# Patient Record
Sex: Female | Born: 1975 | Race: Black or African American | Hispanic: No | Marital: Married | Smoking: Never smoker
Health system: Southern US, Community
[De-identification: ages and names within clinical notes are randomized; demographics above are authoritative.]

## PROBLEM LIST (undated history)

## (undated) DIAGNOSIS — J45909 Unspecified asthma, uncomplicated: Secondary | ICD-10-CM

## (undated) DIAGNOSIS — G43909 Migraine, unspecified, not intractable, without status migrainosus: Secondary | ICD-10-CM

## (undated) DIAGNOSIS — F419 Anxiety disorder, unspecified: Secondary | ICD-10-CM

## (undated) DIAGNOSIS — F32A Depression, unspecified: Secondary | ICD-10-CM

## (undated) DIAGNOSIS — M170 Bilateral primary osteoarthritis of knee: Secondary | ICD-10-CM

## (undated) DIAGNOSIS — I10 Essential (primary) hypertension: Secondary | ICD-10-CM

## (undated) DIAGNOSIS — F329 Major depressive disorder, single episode, unspecified: Secondary | ICD-10-CM

## (undated) DIAGNOSIS — D219 Benign neoplasm of connective and other soft tissue, unspecified: Principal | ICD-10-CM

## (undated) DIAGNOSIS — M722 Plantar fascial fibromatosis: Secondary | ICD-10-CM

## (undated) DIAGNOSIS — R002 Palpitations: Secondary | ICD-10-CM

## (undated) DIAGNOSIS — Z309 Encounter for contraceptive management, unspecified: Secondary | ICD-10-CM

## (undated) HISTORY — DX: Benign neoplasm of connective and other soft tissue, unspecified: D21.9

## (undated) HISTORY — DX: Encounter for contraceptive management, unspecified: Z30.9

## (undated) HISTORY — PX: ABDOMINAL HYSTERECTOMY: SHX81

## (undated) HISTORY — PX: WISDOM TOOTH EXTRACTION: SHX21

## (undated) HISTORY — DX: Essential (primary) hypertension: I10

## (undated) HISTORY — DX: Plantar fascial fibromatosis: M72.2

## (undated) HISTORY — DX: Migraine, unspecified, not intractable, without status migrainosus: G43.909

## (undated) HISTORY — DX: Bilateral primary osteoarthritis of knee: M17.0

## (undated) HISTORY — DX: Unspecified asthma, uncomplicated: J45.909

---

## 1993-07-15 HISTORY — PX: LEEP: SHX91

## 2007-01-05 ENCOUNTER — Ambulatory Visit (HOSPITAL_COMMUNITY): Admission: RE | Admit: 2007-01-05 | Discharge: 2007-01-05 | Payer: Self-pay | Admitting: Family Medicine

## 2007-11-21 ENCOUNTER — Emergency Department (HOSPITAL_COMMUNITY): Admission: EM | Admit: 2007-11-21 | Discharge: 2007-11-21 | Payer: Self-pay | Admitting: Emergency Medicine

## 2008-02-08 ENCOUNTER — Other Ambulatory Visit: Admission: RE | Admit: 2008-02-08 | Discharge: 2008-02-08 | Payer: Self-pay | Admitting: Obstetrics and Gynecology

## 2009-04-12 ENCOUNTER — Other Ambulatory Visit: Admission: RE | Admit: 2009-04-12 | Discharge: 2009-04-12 | Payer: Self-pay | Admitting: Obstetrics and Gynecology

## 2011-07-17 ENCOUNTER — Other Ambulatory Visit (HOSPITAL_COMMUNITY)
Admission: RE | Admit: 2011-07-17 | Discharge: 2011-07-17 | Disposition: A | Discharge status: Home / Self Care | Payer: BC Managed Care – PPO | Source: Ambulatory Visit | Attending: Obstetrics and Gynecology | Admitting: Obstetrics and Gynecology

## 2011-07-17 ENCOUNTER — Other Ambulatory Visit: Payer: Self-pay | Admitting: Adult Health

## 2011-07-17 DIAGNOSIS — Z01419 Encounter for gynecological examination (general) (routine) without abnormal findings: Secondary | ICD-10-CM | POA: Insufficient documentation

## 2012-10-05 ENCOUNTER — Telehealth: Payer: Self-pay | Admitting: Adult Health

## 2012-10-05 NOTE — Telephone Encounter (Signed)
Left message on machine for patient to return call

## 2012-10-05 NOTE — Telephone Encounter (Signed)
Pt stated that her pharmacy faxed a refill request but that it was not authorized. There is no evidence of a refill request being sent.

## 2012-10-05 NOTE — Telephone Encounter (Signed)
Called pt. Left message for her to call back in am.

## 2012-10-08 MED ORDER — LEVONORGEST-ETH ESTRAD 91-DAY 0.15-0.03 &0.01 MG PO TABS: 1.0000 | ORAL_TABLET | Freq: Every day | ORAL | Status: DC

## 2012-10-08 NOTE — Telephone Encounter (Signed)
Pt. Called and needed refill on Seasonique. [facility] [place].

## 2012-10-08 NOTE — Addendum Note (Signed)
Addended by: Cyril Mourning A on: 10/08/2012 05:19 PM   Modules accepted: Orders

## 2013-10-21 ENCOUNTER — Other Ambulatory Visit: Payer: Self-pay | Admitting: Adult Health

## 2013-10-21 MED ORDER — LEVONORGEST-ETH ESTRAD 91-DAY 0.15-0.03 &0.01 MG PO TABS: 1.0000 | ORAL_TABLET | Freq: Every day | ORAL | Status: DC

## 2013-10-26 ENCOUNTER — Other Ambulatory Visit: Payer: Self-pay | Admitting: Adult Health

## 2013-12-21 ENCOUNTER — Other Ambulatory Visit (HOSPITAL_COMMUNITY): Payer: Self-pay | Admitting: Family Medicine

## 2013-12-21 ENCOUNTER — Ambulatory Visit (HOSPITAL_COMMUNITY)
Admission: RE | Admit: 2013-12-21 | Discharge: 2013-12-21 | Disposition: A | Discharge status: Home / Self Care | Payer: BC Managed Care – PPO | Source: Ambulatory Visit | Attending: Family Medicine | Admitting: Family Medicine

## 2013-12-21 DIAGNOSIS — M25569 Pain in unspecified knee: Secondary | ICD-10-CM

## 2014-01-20 ENCOUNTER — Other Ambulatory Visit: Payer: Self-pay | Admitting: Adult Health

## 2014-03-07 ENCOUNTER — Ambulatory Visit (INDEPENDENT_AMBULATORY_CARE_PROVIDER_SITE_OTHER): Payer: BC Managed Care – PPO | Admitting: Adult Health

## 2014-03-07 ENCOUNTER — Other Ambulatory Visit (HOSPITAL_COMMUNITY)
Admission: RE | Admit: 2014-03-07 | Discharge: 2014-03-07 | Disposition: A | Discharge status: Home / Self Care | Payer: BC Managed Care – PPO | Source: Ambulatory Visit | Attending: Adult Health | Admitting: Adult Health

## 2014-03-07 ENCOUNTER — Encounter: Payer: Self-pay | Admitting: Adult Health

## 2014-03-07 VITALS — BP 142/80 | HR 78 | Ht 65.5 in | Wt 205.0 lb

## 2014-03-07 DIAGNOSIS — Z1151 Encounter for screening for human papillomavirus (HPV): Secondary | ICD-10-CM | POA: Insufficient documentation

## 2014-03-07 DIAGNOSIS — Z309 Encounter for contraceptive management, unspecified: Secondary | ICD-10-CM | POA: Insufficient documentation

## 2014-03-07 DIAGNOSIS — Z3041 Encounter for surveillance of contraceptive pills: Secondary | ICD-10-CM

## 2014-03-07 DIAGNOSIS — Z1212 Encounter for screening for malignant neoplasm of rectum: Secondary | ICD-10-CM

## 2014-03-07 DIAGNOSIS — Z01419 Encounter for gynecological examination (general) (routine) without abnormal findings: Secondary | ICD-10-CM | POA: Diagnosis not present

## 2014-03-07 HISTORY — DX: Encounter for contraceptive management, unspecified: Z30.9

## 2014-03-07 LAB — HEMOCCULT GUIAC POC 1CARD (OFFICE): FECAL OCCULT BLD: NEGATIVE

## 2014-03-07 MED ORDER — LEVONORGEST-ETH ESTRAD 91-DAY 0.15-0.03 &0.01 MG PO TABS: ORAL_TABLET | ORAL | Status: DC

## 2014-03-07 NOTE — Progress Notes (Signed)
Patient ID: Natalie Cordova, female   DOB: 03/04/1976, 38 y.o.   MRN: [id number] History of Present Illness: Natalie Cordova is a 38 year old black female, married in for a pap and physical.No complaints.   Current Medications, Allergies, Past Medical History, Past Surgical History, Family History and Social History were reviewed in [employer] record.     Review of Systems: Patient denies any headaches, blurred vision, shortness of breath, chest pain, abdominal pain, problems with bowel movements, urination, or intercourse. No joint pain or mood swings, she is happy with her OCs and periods are good.    Physical Exam:BP 142/80  Pulse 78  Ht 5' 5.5" (1.664 m)  Wt 205 lb (92.987 kg)  BMI 33.58 kg/m2 General:  Well developed, well nourished, no acute distress Skin:  Warm and dry Neck:  Midline trachea, normal thyroid Lungs; Clear to auscultation bilaterally Breast:  No dominant palpable mass, retraction, or nipple discharge Cardiovascular: Regular rate and rhythm Abdomen:  Soft, non tender, no hepatosplenomegaly Pelvic:  External genitalia is normal in appearance, no lesions. The vagina has good color, moisture and rugae. The cervix is bulbous, has several small nabothian cysts at 11-1 o'clock, and friable with EC brush, pap performed with HPV.  Uterus is felt to be normal size, shape, and contour.  No                adnexal masses or tenderness noted. Rectal: Good sphincter tone, no polyps, or hemorrhoids felt.  Hemoccult negative. Extremities:  No swelling or varicosities noted Psych:  No mood changes, alert and cooperative, seems happy   Impression: Yearly gyn exam Contraceptive management    Plan: Refilled camrese x 1 year Physical in 1 year Mammogram now and yearly  Labs with PCP

## 2014-03-07 NOTE — Patient Instructions (Signed)
Physical in 1 year Mammogram now and yearly  Labs PCP

## 2014-03-08 LAB — CYTOLOGY - PAP

## 2014-05-16 ENCOUNTER — Encounter: Payer: Self-pay | Admitting: Adult Health

## 2015-01-23 ENCOUNTER — Other Ambulatory Visit: Payer: Self-pay | Admitting: Adult Health

## 2016-01-15 ENCOUNTER — Other Ambulatory Visit: Payer: Self-pay | Admitting: Adult Health

## 2016-01-17 ENCOUNTER — Other Ambulatory Visit: Payer: Self-pay | Admitting: Adult Health

## 2016-04-23 ENCOUNTER — Other Ambulatory Visit: Payer: Self-pay | Admitting: Family Medicine

## 2016-04-23 DIAGNOSIS — Z1231 Encounter for screening mammogram for malignant neoplasm of breast: Secondary | ICD-10-CM

## 2016-05-07 ENCOUNTER — Ambulatory Visit: Payer: Self-pay

## 2016-05-23 ENCOUNTER — Ambulatory Visit
Admission: RE | Admit: 2016-05-23 | Discharge: 2016-05-23 | Disposition: A | Discharge status: Home / Self Care | Payer: BLUE CROSS/BLUE SHIELD | Source: Ambulatory Visit | Attending: Family Medicine | Admitting: Family Medicine

## 2016-05-23 DIAGNOSIS — Z1231 Encounter for screening mammogram for malignant neoplasm of breast: Secondary | ICD-10-CM

## 2016-05-27 ENCOUNTER — Telehealth: Payer: Self-pay | Admitting: Adult Health

## 2016-05-27 NOTE — Telephone Encounter (Signed)
Had period 10/5- 9, then 10/25-29 and again yesterday, no new meds or antibiotics, on camrese and it has never done this before, make appt to be seen

## 2016-05-27 NOTE — Telephone Encounter (Signed)
Pt called stating that she is having some issues with her period and would like to speak with jennifer first before scheduling an appointment. Please contact pt

## 2016-05-29 ENCOUNTER — Ambulatory Visit (INDEPENDENT_AMBULATORY_CARE_PROVIDER_SITE_OTHER): Payer: BLUE CROSS/BLUE SHIELD | Admitting: Adult Health

## 2016-05-29 ENCOUNTER — Encounter: Payer: Self-pay | Admitting: Adult Health

## 2016-05-29 VITALS — BP 112/72 | HR 94 | Ht 66.0 in | Wt 223.0 lb

## 2016-05-29 DIAGNOSIS — R5383 Other fatigue: Secondary | ICD-10-CM

## 2016-05-29 DIAGNOSIS — N921 Excessive and frequent menstruation with irregular cycle: Secondary | ICD-10-CM

## 2016-05-29 DIAGNOSIS — Z3041 Encounter for surveillance of contraceptive pills: Secondary | ICD-10-CM

## 2016-05-29 DIAGNOSIS — R635 Abnormal weight gain: Secondary | ICD-10-CM | POA: Diagnosis not present

## 2016-05-29 DIAGNOSIS — Z3202 Encounter for pregnancy test, result negative: Secondary | ICD-10-CM | POA: Insufficient documentation

## 2016-05-29 LAB — POCT URINE PREGNANCY: PREG TEST UR: NEGATIVE

## 2016-05-29 MED ORDER — LEVONORGEST-ETH ESTRAD 91-DAY 0.15-0.03 &0.01 MG PO TABS
1.0000 | ORAL_TABLET | Freq: Every day | ORAL | 1 refills | Status: DC
Start: 2016-05-29 — End: 2017-01-11

## 2016-05-29 NOTE — Patient Instructions (Addendum)
Keep taking OCs  Return in 8 weeks for pap and physical  F/U with Dr Karie Kirks next week

## 2016-05-29 NOTE — Progress Notes (Signed)
Subjective:     Patient ID: Natalie Cordova, female   DOB: Feb 03, 1976, 40 y.o.   MRN: [id number]  HPI Natalie Cordova is a 40 year old black female in complaining of irregular bleeding with Camrese.She is working second shift in [place] since [employer] closed and has gained weight and has decreased energy.  Review of Systems +irregular bleeding decreased energy  Weight gain  Reviewed past medical,surgical, social and family history. Reviewed medications and allergies.     Objective:   Physical Exam BP 112/72 (BP Location: Left Arm, Patient Position: Sitting, Cuff Size: Large)   Pulse 94   Ht 5\' 6"  (1.676 m)   Wt 223 lb (101.2 kg)   LMP 05/26/2016   BMI 35.99 kg/m UPT negative, Skin warm and dry. Neck: mid line trachea, normal thyroid, good ROM, no lymphadenopathy noted. Lungs: clear to ausculation bilaterally. Cardiovascular: regular rate and rhythm. Pelvic: external genitalia is normal in appearance no lesions, vagina: period like blood without odor,urethra has no lesions or masses noted, cervix:smooth and bulbous, uterus: normal size, shape and contour, non tender, no masses felt, adnexa: no masses or tenderness noted. Bladder is non tender and no masses felt.    PHQ 2 score 0.Had labs with PCP, and has appt next week with him.If bleeding continues may add estrogen, and may need [place].  Assessment:     1. Irregular intermenstrual bleeding   2. Pregnancy examination or test, negative result   3. Decreased energy   4. Weight gain   5. Encounter for surveillance of contraceptive pills       Plan:     Refilled camrese x 1 1 take 1 daily Return in 8 weeks for pap and physical F/U with Dr Karie Kirks next week, and get copy of labs

## 2016-07-24 ENCOUNTER — Other Ambulatory Visit (HOSPITAL_COMMUNITY)
Admission: RE | Admit: 2016-07-24 | Discharge: 2016-07-24 | Disposition: A | Discharge status: Home / Self Care | Payer: BLUE CROSS/BLUE SHIELD | Source: Ambulatory Visit | Attending: Adult Health | Admitting: Adult Health

## 2016-07-24 ENCOUNTER — Encounter: Payer: Self-pay | Admitting: Adult Health

## 2016-07-24 ENCOUNTER — Ambulatory Visit (INDEPENDENT_AMBULATORY_CARE_PROVIDER_SITE_OTHER): Payer: BLUE CROSS/BLUE SHIELD | Admitting: Adult Health

## 2016-07-24 VITALS — BP 116/76 | HR 105 | Ht 65.5 in | Wt 229.0 lb

## 2016-07-24 DIAGNOSIS — Z1212 Encounter for screening for malignant neoplasm of rectum: Secondary | ICD-10-CM | POA: Diagnosis not present

## 2016-07-24 DIAGNOSIS — Z1151 Encounter for screening for human papillomavirus (HPV): Secondary | ICD-10-CM | POA: Insufficient documentation

## 2016-07-24 DIAGNOSIS — Z01419 Encounter for gynecological examination (general) (routine) without abnormal findings: Secondary | ICD-10-CM | POA: Diagnosis not present

## 2016-07-24 DIAGNOSIS — Z713 Dietary counseling and surveillance: Secondary | ICD-10-CM

## 2016-07-24 DIAGNOSIS — Z3041 Encounter for surveillance of contraceptive pills: Secondary | ICD-10-CM

## 2016-07-24 DIAGNOSIS — R635 Abnormal weight gain: Secondary | ICD-10-CM | POA: Diagnosis not present

## 2016-07-24 DIAGNOSIS — Z302 Encounter for sterilization: Secondary | ICD-10-CM | POA: Insufficient documentation

## 2016-07-24 DIAGNOSIS — Z6837 Body mass index (BMI) 37.0-37.9, adult: Secondary | ICD-10-CM

## 2016-07-24 DIAGNOSIS — Z1211 Encounter for screening for malignant neoplasm of colon: Secondary | ICD-10-CM

## 2016-07-24 LAB — HEMOCCULT GUIAC POC 1CARD (OFFICE): Fecal Occult Blood, POC: NEGATIVE

## 2016-07-24 MED ORDER — PHENTERMINE HCL 15 MG PO CAPS: 15.0000 mg | ORAL_CAPSULE | ORAL | 0 refills | Status: DC

## 2016-07-24 NOTE — Progress Notes (Signed)
Patient ID: Natalie Cordova, female   DOB: June 19, 1976, 41 y.o.   MRN: [id number] History of Present Illness: Natalie Cordova is a 41 year old black female in for well woman gyn exam and pap.She was seen in November and had had BTB and that resolved, she has gained weight and wants to try weight loss meds, has used in past.She had labs at PCP and all was normal she says. PCP is Dr Karie Kirks.    Current Medications, Allergies, Past Medical History, Past Surgical History, Family History and Social History were reviewed in [employer] record.     Review of Systems: Patient denies any headaches, hearing loss, fatigue, blurred vision, shortness of breath, chest pain, abdominal pain, problems with bowel movements, urination, or intercourse. No joint pain or mood swings.Weight gain, has had cold    Physical Exam:BP 116/76 (BP Location: Left Arm, Patient Position: Sitting, Cuff Size: Large)   Pulse (!) 105   Ht 5' 5.5" (1.664 m)   Wt 229 lb (103.9 kg)   LMP 07/18/2016 (Exact Date)   BMI 37.53 kg/m  General:  Well developed, well nourished, no acute distress Skin:  Warm and dry Neck:  Midline trachea, normal thyroid, good ROM, no lymphadenopathy Lungs; Clear to auscultation bilaterally Breast:  No dominant palpable mass, retraction, or nipple discharge Cardiovascular: Regular rate and rhythm Abdomen:  Soft, non tender, no hepatosplenomegaly Pelvic:  External genitalia is normal in appearance, no lesions.  The vagina is normal in appearance. Urethra has no lesions or masses. The cervix is bulbous.Pap with HPV performed.   Uterus is felt to be normal size, shape, and contour.  No adnexal masses or tenderness noted.Bladder is non tender, no masses felt. Rectal: Good sphincter tone, no polyps, or hemorrhoids felt.  Hemoccult negative. Extremities/musculoskeletal:  No swelling or varicosities noted, no clubbing or cyanosis Psych:  No mood changes, alert and cooperative,seems  happy PHQ 2 score 0. She is aware if risks and benefits of phentermine and wants to try, will start at 15 mg.  Impression: 1. Encounter for gynecological examination with Papanicolaou smear of cervix   2. Encounter for surveillance of contraceptive pills   3. Weight gain   4. Body mass index 37.0-37.9, adult   5. Weight loss counseling, encounter for   6. Screening for colorectal cancer       Plan: Meds ordered this encounter  Medications  . phentermine 15 MG capsule    Sig: Take 1 capsule (15 mg total) by mouth every morning.    Dispense:  30 capsule    Refill:  0    Order Specific Question:   Supervising Provider    Answer:   Tania Ade H [[id number]]  Continue camrese, has refills Follow up in 4 weeks Physical in 1 year, pap in 3 if normal Mammogram yearly Labs with Dr Karie Kirks

## 2016-07-26 LAB — CYTOLOGY - PAP
DIAGNOSIS: NEGATIVE
HPV: NOT DETECTED

## 2016-08-21 ENCOUNTER — Ambulatory Visit: Payer: BLUE CROSS/BLUE SHIELD | Admitting: Adult Health

## 2016-08-27 ENCOUNTER — Ambulatory Visit (INDEPENDENT_AMBULATORY_CARE_PROVIDER_SITE_OTHER): Payer: BLUE CROSS/BLUE SHIELD | Admitting: Adult Health

## 2016-08-27 ENCOUNTER — Encounter: Payer: Self-pay | Admitting: Adult Health

## 2016-08-27 VITALS — BP 130/82 | HR 98 | Ht 65.5 in | Wt 225.0 lb

## 2016-08-27 DIAGNOSIS — Z6836 Body mass index (BMI) 36.0-36.9, adult: Secondary | ICD-10-CM

## 2016-08-27 DIAGNOSIS — R030 Elevated blood-pressure reading, without diagnosis of hypertension: Secondary | ICD-10-CM

## 2016-08-27 DIAGNOSIS — Z713 Dietary counseling and surveillance: Secondary | ICD-10-CM

## 2016-08-27 DIAGNOSIS — E6609 Other obesity due to excess calories: Secondary | ICD-10-CM | POA: Diagnosis not present

## 2016-08-27 MED ORDER — PHENTERMINE HCL 15 MG PO CAPS: 15.0000 mg | ORAL_CAPSULE | ORAL | 0 refills | Status: DC

## 2016-08-27 NOTE — Patient Instructions (Signed)
Continue weight loss efforts Increase activity

## 2016-08-27 NOTE — Progress Notes (Signed)
Subjective:     Patient ID: Natalie Cordova, female   DOB: [birth year], 41 y.o.   MRN: [id number]  HPI  Natalie Cordova is a 41 year old black female in for weight and BP check, has been on adipex for 1 month.  Review of Systems Patient denies any headaches, hearing loss, fatigue, blurred vision, shortness of breath, chest pain, abdominal pain, problems with bowel movements, urination, or intercourse. No joint pain or mood swings. Reviewed past medical,surgical, social and family history. Reviewed medications and allergies.     Objective:   Physical Exam BP 130/82 (BP Location: Left Arm, Patient Position: Sitting, Cuff Size: Normal)   Pulse 98   Ht 5' 5.5" (1.664 m)   Wt 225 lb (102.1 kg)   LMP 07/15/2016   BMI 36.87 kg/m PHQ 2 score 0. Skin warm and dry. Lungs: clear to ausculation bilaterally. Cardiovascular: regular rate and rhythm.Lost 4 lbs, will continue adipex.    Assessment:     1. Weight loss counseling, encounter for   2. Body mass index 36.0-36.9, adult       Plan:     Meds ordered this encounter  Medications  . phentermine 15 MG capsule    Sig: Take 1 capsule (15 mg total) by mouth every morning.    Dispense:  30 capsule    Refill:  0    Order Specific Question:   Supervising Provider    Answer:   Tania Ade H [[id number]]  Follow up in 4 weeks Increase activity

## 2016-09-09 ENCOUNTER — Other Ambulatory Visit (HOSPITAL_COMMUNITY)
Admission: RE | Admit: 2016-09-09 | Discharge: 2016-09-09 | Disposition: A | Discharge status: Home / Self Care | Payer: BLUE CROSS/BLUE SHIELD | Source: Ambulatory Visit | Attending: Family Medicine | Admitting: Family Medicine

## 2016-09-09 ENCOUNTER — Other Ambulatory Visit (HOSPITAL_COMMUNITY): Payer: Self-pay | Admitting: Family Medicine

## 2016-09-09 ENCOUNTER — Ambulatory Visit (HOSPITAL_COMMUNITY)
Admission: RE | Admit: 2016-09-09 | Discharge: 2016-09-09 | Disposition: A | Discharge status: Home / Self Care | Payer: BLUE CROSS/BLUE SHIELD | Source: Ambulatory Visit | Attending: Family Medicine | Admitting: Family Medicine

## 2016-09-09 DIAGNOSIS — R0600 Dyspnea, unspecified: Secondary | ICD-10-CM | POA: Diagnosis not present

## 2016-09-09 DIAGNOSIS — R0602 Shortness of breath: Secondary | ICD-10-CM | POA: Insufficient documentation

## 2016-09-09 LAB — BASIC METABOLIC PANEL
ANION GAP: 8 (ref 5–15)
BUN: 10 mg/dL (ref 6–20)
CALCIUM: 9.1 mg/dL (ref 8.9–10.3)
CO2: 24 mmol/L (ref 22–32)
CREATININE: 0.76 mg/dL (ref 0.44–1.00)
Chloride: 106 mmol/L (ref 101–111)
Glucose, Bld: 98 mg/dL (ref 65–99)
Potassium: 3.3 mmol/L — ABNORMAL LOW (ref 3.5–5.1)
Sodium: 138 mmol/L (ref 135–145)

## 2016-09-09 LAB — CBC WITH DIFFERENTIAL/PLATELET
BASOS ABS: 0 10*3/uL (ref 0.0–0.1)
BASOS PCT: 1 %
EOS ABS: 0.2 10*3/uL (ref 0.0–0.7)
Eosinophils Relative: 3 %
HEMATOCRIT: 40.4 % (ref 36.0–46.0)
HEMOGLOBIN: 13.7 g/dL (ref 12.0–15.0)
Lymphocytes Relative: 49 %
Lymphs Abs: 3.1 10*3/uL (ref 0.7–4.0)
MCH: 29.3 pg (ref 26.0–34.0)
MCHC: 33.9 g/dL (ref 30.0–36.0)
MCV: 86.5 fL (ref 78.0–100.0)
MONO ABS: 0.5 10*3/uL (ref 0.1–1.0)
MONOS PCT: 8 %
NEUTROS ABS: 2.5 10*3/uL (ref 1.7–7.7)
NEUTROS PCT: 39 %
Platelets: 399 10*3/uL (ref 150–400)
RBC: 4.67 MIL/uL (ref 3.87–5.11)
RDW: 14.2 % (ref 11.5–15.5)
WBC: 6.4 10*3/uL (ref 4.0–10.5)

## 2016-09-24 ENCOUNTER — Telehealth: Payer: Self-pay | Admitting: Adult Health

## 2016-09-24 ENCOUNTER — Ambulatory Visit: Payer: BLUE CROSS/BLUE SHIELD | Admitting: Adult Health

## 2016-09-24 NOTE — Telephone Encounter (Signed)
Stopped phentermine, it made BP go up, was just letting me know

## 2016-09-24 NOTE — Telephone Encounter (Signed)
Pt called stating that she would like a call back from [employer] only, Pt did not state the reason why, please contact pt

## 2016-10-10 ENCOUNTER — Telehealth: Payer: Self-pay | Admitting: Cardiology

## 2016-10-10 NOTE — Telephone Encounter (Signed)
Received records from [facility] for appointment on 10/25/16 with Dr Ellyn Hack.  Records put with Dr Allison Quarry schedule for 10/25/16. lp

## 2016-10-22 ENCOUNTER — Other Ambulatory Visit: Payer: Self-pay

## 2016-10-25 ENCOUNTER — Encounter: Payer: Self-pay | Admitting: Cardiology

## 2016-10-25 ENCOUNTER — Ambulatory Visit (INDEPENDENT_AMBULATORY_CARE_PROVIDER_SITE_OTHER): Payer: BLUE CROSS/BLUE SHIELD | Admitting: Cardiology

## 2016-10-25 VITALS — BP 140/91 | HR 100 | Ht 65.5 in | Wt 224.0 lb

## 2016-10-25 DIAGNOSIS — R002 Palpitations: Secondary | ICD-10-CM | POA: Diagnosis not present

## 2016-10-25 DIAGNOSIS — Z6837 Body mass index (BMI) 37.0-37.9, adult: Secondary | ICD-10-CM | POA: Diagnosis not present

## 2016-10-25 DIAGNOSIS — I10 Essential (primary) hypertension: Secondary | ICD-10-CM | POA: Diagnosis not present

## 2016-10-25 NOTE — Patient Instructions (Addendum)
If you can go to the  App store and [place] ( [employer]) [place] to recorder   Continue to monitor  - may use VAGAL MANEUVERS - COUGHING AND BEARING DOWN -ICE COLD WATER TO [place], OR DRINKING    Your physician recommends that you schedule a follow-up appointment in 3-4 MONTHS WITH DR HARDING.

## 2016-10-25 NOTE — Progress Notes (Addendum)
PCP: Robert Bellow, MD  Clinic Note: Chief Complaint  Patient presents with  . New Evaluation    tachypalpitations.    HPI: Natalie Cordova is a 41 y.o. female who is being seen today for the evaluation of palpitations & rapid heart beats at the request of Natalie Evens, MD. She has a history of Hypertention, asthma and migraine headaches Apparently she is also been noticing some chest pains in the past because of improved.  LAGUANA DESAUTEL was seen by Dr. Karie Cordova on 10/04/16 & noted 2 episodes of palpitations while sitting at church (1 in Feb & 1 in March). Both times her apple watch when off telling her that her heart rate was in the 130s to 140s bpm range.  Her PCP evaluated her CBC and chemistry panel and liver panel was also normal.  Recent Hospitalizations: n/a  Studies Reviewed: n/a  Interval History: Natalie Cordova presents today for discussion of her palpitation episodes. In the past. When she was having some chest discomfort but that has improved probably was related to a cold. She has not had any further episodes of rapid heartbeats that were prescribed when she saw her PCP. She tells me is that the episodes happened while at church she was relatively calm and all the sudden she felt her heart rate, fast, when she checked her abnormal walks fast. He did not feel he regular, she said the pulse rate looked stable. She felt a bit dizzy and short of breath with it, and felt like her heart was pounding hard. However, the episodes only lasted a couple minutes. She did not really notice any chest tightness or pressure with it. No near syncope or syncope Symptoms. he does drink maybe one caffeinated beverage a day and does not drink any power drinks. In addition to having some chest discomfort she is noticing some left arm discomfort recently, but that has actually improved. She thinks she may have pulled a muscle. It was not at all associated with a fast heart rates.  Cardiac  Review of Symptoms: No further chest pain or shortness of breath with rest or exertion.  No PND, orthopnea or edema.  No TIA/amaurosis fugax symptoms. No claudication.  She is had one episode of syncope about 15 years ago where she passed out in the shower. Since then she has not had any further episodes. She has not been taking phentermine for quite some time before was not on it when she had these episodes  ROS: A comprehensive was performed. Review of Systems  Constitutional: Negative for malaise/fatigue.  HENT: Negative for congestion.   Cardiovascular:       Per history of present illness  Gastrointestinal: Negative for blood in stool and melena.  Genitourinary: Negative for hematuria.  Musculoskeletal: Negative for falls and joint pain.       Left arm pain feeling better.  Neurological: Positive for dizziness (Only with rapid heartbeat spells).  Psychiatric/Behavioral: The patient is not nervous/anxious.        Only occasionally uses temazepam to help her sleep  All other systems reviewed and are negative.   Past Medical History:  Diagnosis Date  . Asthma   . Contraceptive management 03/07/2014  . Essential hypertension   . Migraine headache   . Osteoarthritis of both knees     History reviewed. No pertinent surgical history.  Current Meds  Medication Sig  . hydrochlorothiazide (HYDRODIURIL) 25 MG tablet Take 25 mg by mouth daily.  Marland Kitchen ibuprofen (ADVIL,MOTRIN) 800  MG tablet TAKE 1 TABLET UP TO THREE TIMES A DAY FOR PAIN AND INFLAMMATION PRN  . KLOR-CON M20 20 MEQ tablet Take 1 tablet by mouth daily.  . Levonorgestrel-Ethinyl Estradiol (AMETHIA,CAMRESE) 0.15-0.03 &0.01 MG tablet Take 1 tablet by mouth daily.  . phentermine 15 MG capsule Take 1 capsule (15 mg total) by mouth every morning.  Marland Kitchen PROAIR HFA 108 (90 Base) MCG/ACT inhaler Inhale 2 puffs into the lungs every 4 (four) hours as needed.  . SUMAtriptan (IMITREX) 100 MG tablet Take 1 tablet by mouth daily.  .  temazepam (RESTORIL) 30 MG capsule Take 1 capsule by mouth at bedtime.  - NOT taking Phenteramine  No Known Allergies  Social History   Social History  . Marital status: Single    Spouse name: N/A  . Number of children: 1  . Years of education: 23   Social History Main Topics  . Smoking status: Never Smoker  . Smokeless tobacco: Never Used  . Alcohol use Yes     Comment: occ beer or wine  . Drug use: No  . Sexual activity: Yes    Birth control/ protection: Pill   Other Topics Concern  . None   Social History Narrative   She is been married for 3-1/2 years. She lives with her husband and daughter (15 year old). She has a high school education, but is not currently working. She does not exercise because of lack of motivation. She does not drink alcohol or smoke.  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  family history includes Cancer in her maternal grandfather; Healthy in her brother, father, and mother.  Wt Readings from Last 3 Encounters:  10/25/16 224 lb (101.6 kg)  08/27/16 225 lb (102.1 kg)  07/24/16 229 lb (103.9 kg)    PHYSICAL EXAM BP (!) 140/91 (BP Location: Right Arm)   Pulse 100   Ht 5' 5.5" (1.664 m)   Wt 224 lb (101.6 kg)   BMI 36.71 kg/m  General appearance: alert, cooperative, appears stated age, no distress and Moderately obese. Well-nourished and well-groomed. HEENT: Natalie Cordova/AT, EOMI, MMM, anicteric sclera Neck: no adenopathy, no carotid bruit and no JVD Lungs: clear to auscultation bilaterally, normal percussion bilaterally and non-labored Heart: regular rate and rhythm, S1 & S2 normal, no murmur, click, rub or gallops; nondisplaced PMI Abdomen: soft, non-tender; bowel sounds normal; no masses,  no organomegaly; no HSM Extremities: extremities normal, atraumatic, no cyanosis, and edema - trivial Pulses: 2+ and symmetric;  Skin: mobility and turgor normal, no evidence of bleeding or bruising and no lesions noted Neurologic: Mental  status: Alert, oriented, thought content appropriate Cranial nerves: normal (II-XII grossly intact)    Adult ECG Report  Rate: 96 ;  Rhythm: normal sinus rhythm and normal axis, intervals & durations.;   Narrative Interpretation: normal   Other studies Reviewed: Additional studies/ records that were reviewed today include:  Recent Labs:  Labs just checked by PCP potassium was 3.3. Per clinic note, TSH was in checked. Lipid panel from August was stable.    ASSESSMENT / PLAN: Problem List Items Addressed This Visit    Body mass index 37.0-37.9, adult    Thankfully, it appears that she was not actually taken phentermine when she had these spells. She is adjusted weight loss, but am leery of restarting phentermine for the potential side effect of palpitations while we're trying to evaluate these spells.      Essential hypertension (Chronic)    BP essentially borderline  with resting HR of 100 bpm - would consider adding a Beta Blocker or Non-dihydropyridine calcium channel blocker (Diltiazem or Verapamil) as her next line of medication - would help with resting tachycardia as well as palpitations/ ? PSVT.      Relevant Orders   EKG 12-Lead   Heart palpitations - Primary    She has had 2 episodes in 2 months it only lasted about 2 minutes each. I am reluctant to have her wear a monitor for fear that she will actually have an episode captured. Since she does have an apple watch which is picked up both episodes, I suggested that she download a monitoring  App for her eye phone and I was asked that we will record her heart rhythms if she has these episodes. Line By description sudden onset tachycardia followed by sudden stopping would be more consistent with the episode of PSVT. The only downside to that the areas that usually SVT would be faster than 130s-140 BPM. However with no other potential diagnosis, I discussed vagal maneuvers such as coughing, Valsalva,etc.  - Plan to see her back in  roughly 3 or 4 months to see if she's had any episodes recorded in order to know the next course of action. We did discuss the possibility of beta blocker as Interim hypertensive agent which would help treat both her resting tachycardia and these palpitations spells.      Relevant Orders   EKG 12-Lead      Current medicines are reviewed at length with the patient today. (+/- concerns) n/a The following changes have been made: n/a  Patient Instructions  If you can go to the  App store and [place] ( [employer]OR) [place] to recorder   Continue to monitor  - may use VAGAL MANEUVERS - COUGHING AND BEARING DOWN -ICE COLD WATER TO [place]    Your physician recommends that you schedule a follow-up appointment in 3-4 MONTHS WITH DR Tavarius Grewe.    Studies Ordered:   Orders Placed This Encounter  Procedures  . EKG 12-Lead      Glenetta Hew, M.D., M.S. Interventional Cardiologist   Pager # [phone] Phone # [phone] [address] [place], [place] [address]

## 2016-10-27 ENCOUNTER — Encounter: Payer: Self-pay | Admitting: Cardiology

## 2016-10-27 NOTE — Assessment & Plan Note (Addendum)
She has had 2 episodes in 2 months it only lasted about 2 minutes each. I am reluctant to have her wear a monitor for fear that she will actually have an episode captured. Since she does have an apple watch which is picked up both episodes, I suggested that she download a monitoring  App for her eye phone and I was asked that we will record her heart rhythms if she has these episodes. Line By description sudden onset tachycardia followed by sudden stopping would be more consistent with the episode of PSVT. The only downside to that the areas that usually SVT would be faster than 130s-140 BPM. However with no other potential diagnosis, I discussed vagal maneuvers such as coughing, Valsalva,etc.  - Plan to see her back in roughly 3 or 4 months to see if she's had any episodes recorded in order to know the next course of action. We did discuss the possibility of beta blocker as Interim hypertensive agent which would help treat both her resting tachycardia and these palpitations spells.

## 2016-10-27 NOTE — Assessment & Plan Note (Signed)
BP essentially borderline with resting HR of 100 bpm - would consider adding a Beta Blocker or Non-dihydropyridine calcium channel blocker (Diltiazem or Verapamil) as her next line of medication - would help with resting tachycardia as well as palpitations/ ? PSVT.

## 2016-10-27 NOTE — Assessment & Plan Note (Signed)
Thankfully, it appears that she was not actually taken phentermine when she had these spells. She is adjusted weight loss, but am leery of restarting phentermine for the potential side effect of palpitations while we're trying to evaluate these spells.

## 2017-01-11 ENCOUNTER — Other Ambulatory Visit: Payer: Self-pay | Admitting: Adult Health

## 2017-01-27 ENCOUNTER — Ambulatory Visit: Payer: BLUE CROSS/BLUE SHIELD | Admitting: Cardiology

## 2017-02-13 ENCOUNTER — Ambulatory Visit: Payer: BLUE CROSS/BLUE SHIELD | Admitting: Cardiology

## 2017-04-21 ENCOUNTER — Telehealth: Payer: Self-pay | Admitting: *Deleted

## 2017-04-21 NOTE — Telephone Encounter (Signed)
Pt called stating that her birth [facility] [place] and [place]. Advised pt to try calling [facility] to see if they have it in stock, if not I would see if provider would prescribe something different. Pt verbalized understanding.

## 2017-04-22 NOTE — Telephone Encounter (Signed)
Pt called back and would like a new pill sent to [facility] [place].  Pt finished her previous pack on Sunday.  Pt aware to use back up birth control for first month per Derrek Monaco, NP.  Pt voiced understanding.  04-22-17  AS

## 2017-04-22 NOTE — Telephone Encounter (Signed)
Left message I called about her OCs, just let us know, if she found or wants different pill

## 2017-05-08 ENCOUNTER — Other Ambulatory Visit: Payer: Self-pay | Admitting: Family Medicine

## 2017-05-08 DIAGNOSIS — Z139 Encounter for screening, unspecified: Secondary | ICD-10-CM

## 2017-06-03 ENCOUNTER — Ambulatory Visit
Admission: RE | Admit: 2017-06-03 | Discharge: 2017-06-03 | Disposition: A | Discharge status: Home / Self Care | Payer: BLUE CROSS/BLUE SHIELD | Source: Ambulatory Visit | Attending: Family Medicine | Admitting: Family Medicine

## 2017-06-03 DIAGNOSIS — Z139 Encounter for screening, unspecified: Secondary | ICD-10-CM

## 2017-06-17 ENCOUNTER — Telehealth: Payer: Self-pay | Admitting: Adult Health

## 2017-06-17 NOTE — Telephone Encounter (Signed)
Patient called stating she is having very irregular bleeding, light/ heavy, cramping and "it's becoming a problem, something needs to be done". States she was dealing with this last year and just dealt with it but now wants something done. She says her period is not supposed to start until the end of this month but it has already started and she is only to have a period every 3 months but here lately that is not the case " it just comes on when it wants too". Please advise.

## 2017-06-17 NOTE — Telephone Encounter (Signed)
Patient called stating that she would like a call back from [place] did not state the reason for the call. Please contact pt

## 2017-06-18 NOTE — Telephone Encounter (Signed)
Informed patient that Anderson Malta would like to see her to discuss what needs to be done next. Pt states she will call back to schedule appointment.

## 2017-07-04 ENCOUNTER — Ambulatory Visit: Payer: BLUE CROSS/BLUE SHIELD | Admitting: Adult Health

## 2017-07-04 ENCOUNTER — Encounter: Payer: Self-pay | Admitting: Adult Health

## 2017-07-04 VITALS — BP 126/80 | HR 115 | Ht 65.5 in | Wt 234.5 lb

## 2017-07-04 DIAGNOSIS — N921 Excessive and frequent menstruation with irregular cycle: Secondary | ICD-10-CM

## 2017-07-04 DIAGNOSIS — Z3202 Encounter for pregnancy test, result negative: Secondary | ICD-10-CM

## 2017-07-04 DIAGNOSIS — N888 Other specified noninflammatory disorders of cervix uteri: Secondary | ICD-10-CM

## 2017-07-04 DIAGNOSIS — R000 Tachycardia, unspecified: Secondary | ICD-10-CM | POA: Diagnosis not present

## 2017-07-04 LAB — POCT URINE PREGNANCY: Preg Test, Ur: NEGATIVE

## 2017-07-04 NOTE — Progress Notes (Signed)
Subjective:     Patient ID: Natalie Cordova, female   DOB: 03/06/1976, 41 y.o.   MRN: [id number]  HPI Natalie Cordova is a 41 year old black female in complaining of irregular bleeding on OCs, and weight gain and decreased energy.   Review of Systems +irregular bleeding Weight gain +decreased energy  +stress at work  Reviewed past medical,surgical, social and family history. Reviewed medications and allergies.     Objective:   Physical Exam BP 126/80 (BP Location: Left Arm, Patient Position: Sitting, Cuff Size: Large)   Pulse (!) 115   Ht 5' 5.5" (1.664 m)   Wt 234 lb 8 oz (106.4 kg)   BMI 38.43 kg/m UPT is negative. Skin warm and dry. Lungs: clear to ausculation bilaterally. Cardiovascular: regular rate and rhythm.  Pelvic: external genitalia is normal in appearance no lesions, vagina: pink with good moisture and rugae,urethra has no lesions or masses noted, cervix is  Bulbous,with nabothian cyst at 12-1 o'clock, uterus: normal size, shape and contour, non tender, no masses felt, adnexa: no masses or tenderness noted. Bladder is non tender and no masses felt. She has seen cardiologist and had negative W/U in the past.  Will check labs and scheduled [place].  Assessment:     1. Irregular intermenstrual bleeding   2. Tachycardia   3. Pregnancy examination or test, negative result       Plan:    Continue OCs Check CBC,CMP,TSH  Return in 2 weeks for GYN [place]  Decrease caffeine  Try weight watchers

## 2017-07-04 NOTE — Patient Instructions (Signed)
Decrease caffeine

## 2017-07-05 LAB — COMPREHENSIVE METABOLIC PANEL
ALK PHOS: 56 IU/L (ref 39–117)
ALT: 16 IU/L (ref 0–32)
AST: 15 IU/L (ref 0–40)
Albumin/Globulin Ratio: 1.5 (ref 1.2–2.2)
Albumin: 4.2 g/dL (ref 3.5–5.5)
BUN/Creatinine Ratio: 12 (ref 9–23)
BUN: 9 mg/dL (ref 6–24)
CHLORIDE: 106 mmol/L (ref 96–106)
CO2: 18 mmol/L — AB (ref 20–29)
CREATININE: 0.74 mg/dL (ref 0.57–1.00)
Calcium: 9.4 mg/dL (ref 8.7–10.2)
GFR calc Af Amer: 115 mL/min/{1.73_m2} (ref >59)
GFR calc non Af Amer: 100 mL/min/{1.73_m2} (ref >59)
GLUCOSE: 106 mg/dL — AB (ref 65–99)
Globulin, Total: 2.8 g/dL (ref 1.5–4.5)
Potassium: 4.1 mmol/L (ref 3.5–5.2)
Sodium: 138 mmol/L (ref 134–144)
Total Protein: 7 g/dL (ref 6.0–8.5)

## 2017-07-05 LAB — CBC
Hematocrit: 39.6 % (ref 34.0–46.6)
Hemoglobin: 13.4 g/dL (ref 11.1–15.9)
MCH: 28.8 pg (ref 26.6–33.0)
MCHC: 33.8 g/dL (ref 31.5–35.7)
MCV: 85 fL (ref 79–97)
PLATELETS: 430 10*3/uL — AB (ref 150–379)
RBC: 4.65 x10E6/uL (ref 3.77–5.28)
RDW: 14.8 % (ref 12.3–15.4)
WBC: 6.5 10*3/uL (ref 3.4–10.8)

## 2017-07-05 LAB — TSH: TSH: 1.29 u[IU]/mL (ref 0.450–4.500)

## 2017-07-21 ENCOUNTER — Ambulatory Visit (INDEPENDENT_AMBULATORY_CARE_PROVIDER_SITE_OTHER): Payer: BLUE CROSS/BLUE SHIELD

## 2017-07-21 DIAGNOSIS — D259 Leiomyoma of uterus, unspecified: Secondary | ICD-10-CM

## 2017-07-21 DIAGNOSIS — N921 Excessive and frequent menstruation with irregular cycle: Secondary | ICD-10-CM

## 2017-07-21 NOTE — Progress Notes (Signed)
PELVIC US TA/TV:enlarged heterogeneous retroflexed uterus w/mult.fibroids,(#1) submucosal fibroid distorting endometrium 2.8 x 2.7 x 1.5 cm,(#2) anterior subserosal fibroid 2.9 x 2.2 x 3.4 cm,(#3) anterior intramural fibroid 1.5 x 1.3 x 1.8 cm,EEC 13 mm,normal right ovary,3 x 1.4 x 2.2 cm left hypoechoic ovarian mass vs pedunculated fibroid,it appears to be separate from uterus w/probe pressure,both ovaries are mobile,no free fluid,no pain during ultrasound

## 2017-07-22 ENCOUNTER — Encounter: Payer: Self-pay | Admitting: Adult Health

## 2017-07-22 ENCOUNTER — Telehealth: Payer: Self-pay | Admitting: Adult Health

## 2017-07-22 DIAGNOSIS — D219 Benign neoplasm of connective and other soft tissue, unspecified: Secondary | ICD-10-CM

## 2017-07-22 HISTORY — DX: Benign neoplasm of connective and other soft tissue, unspecified: D21.9

## 2017-07-22 NOTE — Telephone Encounter (Signed)
Pt aware US shows multiple fibroids and Dr Glo Herring suggests endometrial biopsy, will make appt with him for biopsy

## 2017-07-24 ENCOUNTER — Ambulatory Visit: Payer: BLUE CROSS/BLUE SHIELD | Admitting: Cardiology

## 2017-07-24 ENCOUNTER — Encounter: Payer: Self-pay | Admitting: Cardiology

## 2017-07-24 VITALS — BP 128/72 | HR 82 | Ht 65.5 in | Wt 235.6 lb

## 2017-07-24 DIAGNOSIS — R002 Palpitations: Secondary | ICD-10-CM

## 2017-07-24 DIAGNOSIS — I10 Essential (primary) hypertension: Secondary | ICD-10-CM

## 2017-07-24 NOTE — Patient Instructions (Addendum)
NO MEDICATION CHANGES   TEST /PROCEDURES SCHEDULE AT [facility] has recommended that you wear a 48  holter monitor. Holter monitors are medical devices that record the heart's electrical activity. Doctors most often use these monitors to diagnose arrhythmias. Arrhythmias are problems with the speed or rhythm of the heartbeat. The monitor is a small, portable device. You can wear one while you do your normal daily activities. This is usually used to diagnose what is causing palpitations/syncope (passing out).   KEEP HYDRATED  DRINK 8-10 GLASSES OF  AT LEAST  8 OZ WATER A DAY URINE SHOULD BE CLEAR WHEN YOU GO T O THE BATHROOM.   BIO FEEDBACK METHODS LIKE TAKING DEEP BREATHES   Your physician recommends that you schedule a follow-up appointment in  [place].

## 2017-07-24 NOTE — Progress Notes (Signed)
PCP: Lemmie Evens, MD  Clinic Note: Chief Complaint  Patient presents with  . Follow-up  . Palpitations    HPI: Natalie Cordova is a 42 y.o. female with a PMH below who presents today for delayed follow-up to reassess palpitations and hypertension.  There is concern about possible PSVT. Marland Kitchen   Natalie Cordova was last seen in April 2018.  She had only noted 2 episodes in about 2 months lasting 2 minutes of palpitations.  We decided against her wearing a monitor.  She was to use the apple watch to follow her heart rates.  Heart rates.  So I would like well your heart.    Recent Hospitalizations: none  Studies Personally Reviewed - (if available, images/films reviewed: From Epic Chart or Care Everywhere)  none  Interval History: She presents here today noticing that her palpitations seem to gotten worse. Apparently they went away last spring and she did not come to follow-up. She says over last month or so her irregular heartbeats seem to be worsening. They do seem to be better overall since she started metoprolol, but still feels that her heart rate can get up some. She notes that her heart rate can get fast in the shower with minimal activity. She also says he gets fast if she is anxious or stressed out. She denies any sensation of lightheadedness or dizziness associated with. No syncope or near syncope. No TIA or amaurosis fugax.  No chest pain or shortness of breath with rest or exertion. No PND, orthopnea or edema.  No claudication.  ROS: A comprehensive was performed. Pertinent Sx noted in HPI Review of Systems  Constitutional: Negative for malaise/fatigue.  Respiratory: Negative for shortness of breath.   Cardiovascular: Positive for palpitations.  Gastrointestinal: Negative.   Genitourinary: Negative.   Musculoskeletal: Negative.   Psychiatric/Behavioral: Negative.   All other systems reviewed and are negative.  I have reviewed and (if needed) personally updated  the patient's problem list, medications, allergies, past medical and surgical history, social and family history.   Past Medical History:  Diagnosis Date  . Asthma   . Contraceptive management 03/07/2014  . Essential hypertension   . Fibroids 07/22/2017  . Migraine headache   . Osteoarthritis of both knees     History reviewed. No pertinent surgical history.  Current Meds  Medication Sig  . GuaiFENesin (MUCINEX PO) Take by mouth as needed.  . hydrochlorothiazide (HYDRODIURIL) 25 MG tablet Take 25 mg by mouth daily.  Marland Kitchen ibuprofen (ADVIL,MOTRIN) 800 MG tablet TAKE 1 TABLET UP TO THREE TIMES A DAY FOR PAIN AND INFLAMMATION PRN  . Levonorgestrel-Ethinyl Estradiol (AMETHIA,CAMRESE) 0.15-0.03 &0.01 MG tablet TAKE 1 TABLET BY MOUTH DAILY.  . metoprolol succinate (TOPROL-XL) 25 MG 24 hr tablet TAKE ONE TABLET EACH DAY FOR HIGH BLOOD PRESSURE AND RAPID HEART BEAT.  Marland Kitchen PROAIR HFA 108 (90 Base) MCG/ACT inhaler Inhale 2 puffs into the lungs every 4 (four) hours as needed.  . SUMAtriptan (IMITREX) 100 MG tablet Take 1 tablet by mouth as needed.   . temazepam (RESTORIL) 30 MG capsule Take 1 capsule by mouth at bedtime as needed.     No Known Allergies  Social History   Tobacco Use  . Smoking status: Never Smoker  . Smokeless tobacco: Never Used  Substance Use Topics  . Alcohol use: Yes    Comment: occ beer or wine  . Drug use: No   Social History   Social History Narrative   She has been married  for 3-1/2 years. She lives with her husband and daughter (46 year old). She has a high school education, but is not currently working. She does not exercise because of lack of motivation. She does not drink alcohol or smoke.    family history includes Cancer in her maternal grandfather; Healthy in her brother, father, and mother.  Wt Readings from Last 3 Encounters:  07/24/17 235 lb 9.6 oz (106.9 kg)  07/04/17 234 lb 8 oz (106.4 kg)  10/25/16 224 lb (101.6 kg)    PHYSICAL EXAM BP 128/72    Pulse 82   Ht 5' 5.5" (1.664 m)   Wt 235 lb 9.6 oz (106.9 kg)   BMI 38.61 kg/m  Physical Exam  Constitutional: She is oriented to person, place, and time. She appears well-developed and well-nourished. No distress.  HENT:  Head: Normocephalic and atraumatic.  Eyes: EOM are normal. Pupils are equal, round, and reactive to light.  Neck: Normal range of motion. Neck supple. No hepatojugular reflux and no JVD present. Carotid bruit is not present.  Cardiovascular: Normal rate, regular rhythm, normal heart sounds and intact distal pulses.  No extrasystoles are present. PMI is not displaced. Exam reveals no gallop and no friction rub.  No murmur heard. Pulmonary/Chest: Effort normal and breath sounds normal. No respiratory distress. She has no wheezes. She has no rales.  Abdominal: Soft. Bowel sounds are normal. She exhibits no distension. There is no tenderness. There is no rebound.  Musculoskeletal: Normal range of motion. She exhibits no edema.  Neurological: She is alert and oriented to person, place, and time.  Skin: Skin is warm and dry. No rash noted. No erythema.  Psychiatric: She has a normal mood and affect. Her behavior is normal. Judgment and thought content normal.  Nursing note and vitals reviewed.   Adult ECG Report  Rate: 82 ;  Rhythm: normal sinus rhythm and normal axis, intervals & durations;   Narrative Interpretation: stable / normal EKG   Other studies Reviewed: Additional studies/ records that were reviewed today include:  Recent Labs:   Lab Results  Component Value Date   CREATININE 0.74 07/04/2017   BUN 9 07/04/2017   NA 138 07/04/2017   K 4.1 07/04/2017   CL 106 07/04/2017   CO2 18 (L) 07/04/2017   No results found for: CHOL, HDL, LDLCALC, LDLDIRECT, TRIG, CHOLHDL   ASSESSMENT / PLAN: Problem List Items Addressed This Visit    Essential hypertension (Chronic)    Well-controlled with current dose of Toprol.      Relevant Medications   metoprolol  succinate (TOPROL-XL) 25 MG 24 hr tablet   Other Relevant Orders   EKG 12-Lead (Completed)   Heart palpitations - Primary    Now with more frequent episodes happening at least once twice a day of tachycardia/palpitations. I think at this point we may be a be able to capture something using a 48-hour monitor  Continue beta blocker. Encourage adequate hydration.  Also encourage biofeedback techniques of deep breathing and relaxation/calming techniques..      Relevant Orders   EKG 12-Lead (Completed)   HOLTER MONITOR - 48 HOUR      Current medicines are reviewed at length with the patient today. (+/- concerns) n/a The following changes have been made: see below   Patient Instructions  NO MEDICATION CHANGES   TEST /PROCEDURES SCHEDULE AT [facility]  Your physician has recommended that you wear a 48  holter monitor. Holter monitors are medical devices that record  the heart's electrical activity. Doctors most often use these monitors to diagnose arrhythmias. Arrhythmias are problems with the speed or rhythm of the heartbeat. The monitor is a small, portable device. You can wear one while you do your normal daily activities. This is usually used to diagnose what is causing palpitations/syncope (passing out).   KEEP HYDRATED  DRINK 8-10 GLASSES OF  AT LEAST  8 OZ WATER A DAY URINE SHOULD BE CLEAR WHEN YOU GO T O THE BATHROOM.   BIO FEEDBACK METHODS LIKE TAKING DEEP BREATHES   Your physician recommends that you schedule a follow-up appointment in  [place].    Studies Ordered:   Orders Placed This Encounter  Procedures  . HOLTER MONITOR - 48 HOUR  . EKG 12-Lead      Glenetta Hew, M.D., M.S. Interventional Cardiologist   Pager # [phone] Phone # [phone] [address] [place], [place] [address]   Thank you for choosing [facility] at [facility]!!

## 2017-07-27 ENCOUNTER — Encounter: Payer: Self-pay | Admitting: Cardiology

## 2017-07-27 NOTE — Assessment & Plan Note (Signed)
Now with more frequent episodes happening at least once twice a day of tachycardia/palpitations. I think at this point we may be a be able to capture something using a 48-hour monitor  Continue beta blocker. Encourage adequate hydration.  Also encourage biofeedback techniques of deep breathing and relaxation/calming techniques.Marland Kitchen

## 2017-07-27 NOTE — Assessment & Plan Note (Signed)
Well-controlled with current dose of Toprol.

## 2017-08-04 ENCOUNTER — Ambulatory Visit (INDEPENDENT_AMBULATORY_CARE_PROVIDER_SITE_OTHER): Payer: BLUE CROSS/BLUE SHIELD

## 2017-08-04 DIAGNOSIS — R002 Palpitations: Secondary | ICD-10-CM

## 2017-08-07 ENCOUNTER — Ambulatory Visit (INDEPENDENT_AMBULATORY_CARE_PROVIDER_SITE_OTHER): Payer: BLUE CROSS/BLUE SHIELD | Admitting: Obstetrics and Gynecology

## 2017-08-07 ENCOUNTER — Other Ambulatory Visit: Payer: Self-pay

## 2017-08-07 ENCOUNTER — Encounter: Payer: Self-pay | Admitting: Obstetrics and Gynecology

## 2017-08-07 ENCOUNTER — Other Ambulatory Visit: Payer: Self-pay | Admitting: Obstetrics and Gynecology

## 2017-08-07 VITALS — BP 124/82 | HR 103 | Ht 65.5 in | Wt 235.0 lb

## 2017-08-07 DIAGNOSIS — D259 Leiomyoma of uterus, unspecified: Secondary | ICD-10-CM

## 2017-08-07 DIAGNOSIS — N939 Abnormal uterine and vaginal bleeding, unspecified: Secondary | ICD-10-CM | POA: Diagnosis not present

## 2017-08-07 DIAGNOSIS — Z3202 Encounter for pregnancy test, result negative: Secondary | ICD-10-CM | POA: Diagnosis not present

## 2017-08-07 LAB — POCT URINE PREGNANCY: PREG TEST UR: NEGATIVE

## 2017-08-07 NOTE — Progress Notes (Signed)
Natalie Cordova is a 42 y.o. female here for endometrial biopsy. For the last year and a half, her periods have been very irregular. She has a lot of clots, cramping, and spotting.  She has not had her tubes tied, but she is done having babies. Her birth control method is OCPs.    Endometrial Biopsy: Patient given informed consent, signed copy in the chart, time out was performed. Time out taken. The patient was placed in the lithotomy position and the cervix brought into view with sterile speculum.  Portio of cervix cleansed x 2 with betadine swabs.  A tenaculum was placed in the anterior lip of the cervix.  Pelvic Exam: Cervix is multiparous, some scarring present s/p LEEP Uterus is retroverted, 8-10 week size uterus, moderately enlarged   The uterus was sounded for depth of 10 cm,. Milex uterine Explora 3 mm was introduced to into the uterus, suction created,  and an endometrial sample was obtained. All equipment was removed and accounted for.   The patient tolerated the procedure well.    Patient given post procedure instructions.  Followup: in 1 week to discuss results and endometrial ablation, other management options   By signing my name below, I, Izna Ahmed, attest that this documentation has been prepared under the direction and in the presence of Jonnie Kind, MD. Electronically Signed: Jabier Gauss, Medical Scribe. 08/07/17. 9:54 AM.  I personally performed the services described in this documentation, which was SCRIBED in my presence. The recorded information has been reviewed and considered accurate. It has been edited as necessary during review. Jonnie Kind, MD

## 2017-08-22 ENCOUNTER — Ambulatory Visit (INDEPENDENT_AMBULATORY_CARE_PROVIDER_SITE_OTHER): Payer: BLUE CROSS/BLUE SHIELD | Admitting: Obstetrics and Gynecology

## 2017-08-22 ENCOUNTER — Encounter: Payer: Self-pay | Admitting: Obstetrics and Gynecology

## 2017-08-22 VITALS — BP 118/80 | HR 86 | Wt 236.0 lb

## 2017-08-22 DIAGNOSIS — Z3009 Encounter for other general counseling and advice on contraception: Secondary | ICD-10-CM | POA: Diagnosis not present

## 2017-08-22 DIAGNOSIS — D250 Submucous leiomyoma of uterus: Secondary | ICD-10-CM | POA: Diagnosis not present

## 2017-08-22 DIAGNOSIS — N939 Abnormal uterine and vaginal bleeding, unspecified: Secondary | ICD-10-CM

## 2017-08-22 NOTE — Progress Notes (Signed)
[facility] Visit  @DATE @            Patient name: Natalie Cordova MRN [id number]  Date of birth: 11/13/1975  CC & HPI:  Natalie Cordova is a 42 y.o. female presenting today for review of ultrasound and follow-up for irregular periods on oral contraceptives ultrasound has shown slight fibroid enlargement of the uterus with a submucosal fibroid in addition to subserosal and pedunculated fibroids  ROS:  ROS   Pertinent History Reviewed:   Reviewed: Significant for no anemia hemoglobin 13.4 at last check in December Medical         Past Medical History:  Diagnosis Date  . Asthma   . Contraceptive management 03/07/2014  . Essential hypertension   . Fibroids 07/22/2017  . Migraine headache   . Osteoarthritis of both knees                               Surgical Hx:   History reviewed. No pertinent surgical history. Medications: Reviewed & Updated - see associated section                       Current Outpatient Medications:  .  hydrochlorothiazide (HYDRODIURIL) 25 MG tablet, Take 25 mg by mouth daily., Disp: , Rfl:  .  ibuprofen (ADVIL,MOTRIN) 800 MG tablet, TAKE 1 TABLET UP TO THREE TIMES A DAY FOR PAIN AND INFLAMMATION PRN, Disp: , Rfl: 11 .  Levonorgestrel-Ethinyl Estradiol (AMETHIA,CAMRESE) 0.15-0.03 &0.01 MG tablet, TAKE 1 TABLET BY MOUTH DAILY., Disp: 91 tablet, Rfl: 2 .  metoprolol succinate (TOPROL-XL) 25 MG 24 hr tablet, TAKE ONE TABLET EACH DAY FOR HIGH BLOOD PRESSURE AND RAPID HEART BEAT., Disp: , Rfl: 3 .  PROAIR HFA 108 (90 Base) MCG/ACT inhaler, Inhale 2 puffs into the lungs every 4 (four) hours as needed., Disp: , Rfl:  .  SUMAtriptan (IMITREX) 100 MG tablet, Take 1 tablet by mouth as needed. , Disp: , Rfl:  .  temazepam (RESTORIL) 30 MG capsule, Take 1 capsule by mouth at bedtime as needed. , Disp: , Rfl: 5   Social History: Reviewed -  reports that  has never smoked. she has never used smokeless tobacco.  Objective Findings:  Vitals: Blood pressure  118/80, pulse 86, weight 236 lb (107 kg), last menstrual period 08/22/2017.  PHYSICAL EXAMINATION General appearance - alert, well appearing, and in no distress Mental status - alert, oriented to person, place, and time, normal mood, behavior, speech, dress, motor activity, and thought processes, affect appropriate to mood Chest - not examined Heart - normal rate and regular rhythm Abdomen - soft, nontender, nondistended, no masses or organomegaly Breasts -  Skin -   PELVIC  GYNECOLOGIC SONOGRAM   Natalie Cordova is a 42 y.o. G1P1001 LMP 07/16/2017,she is here for a pelvic sonogram for abnormal uterine bleeding.  Uterus                      9.5 x 7.1 x 7.8 cm, vol 276 ml,enlarged heterogeneous retroflexed uterus w/mult.fibroids including pedunculated apparent fibroid.  Endometrium          13 mm, symmetrical, submucosal fibroid distorting endometrium 2.8 x 2.7 x 1.5 cm in the lower uterine segment area  Right ovary             2.4 x 1.3 x 1.6 cm, wnl  Left ovary  2 x 2 x 2.9 cm, 3 x 1.4 x 2.2 cm left hypoechoic ovarian mass vs pedunculated fibroid,it appears to be separate from uterus w/probe pressure  No free fluid   Technician Comments:  PELVIC US TA/TV:enlarged heterogeneous retroflexed uterus w/mult.fibroids,(#1) submucosal fibroid distorting endometrium 2.8 x 2.7 x 1.5 cm,(#2) anterior subserosal fibroid 2.9 x 2.2 x 3.4 cm,(#3) anterior intramural fibroid 1.5 x 1.3 x 1.8 cm,EEC 13 mm,normal right ovary,3 x 1.4 x 2.2 cm left hypoechoic ovarian mass vs pedunculated fibroid,it appears to be separate from uterus w/probe pressure,both ovaries are mobile,no free fluid,no pain during ultrasound    [employer] 07/21/2017 5:01 PM  Clinical Impression and recommendations:  I have reviewed the sonogram results above.  Combined with the patient's current clinical course, below are my impressions and any appropriate recommendations for management based  on the sonographic findings:  1.  Enlarged uterus with diffuse heterogeneous pattern within the  myometrium consistent with multiple small fibroids.  The pedunculated fibroid in the left adnexa is the same echotexture as the intrauterine fibroids.  There is no cystic component.  There is no ascites.  Endometrial cavity is slightly distorted by fibroid.  Endometrial biopsy may be needed. Natalie Cordova  Assessment & Plan:   A:  1. Abnormal uterine bleeding on Camrese  submucosal fibroid 2. Moderate uterine enlargement due to intramural fibroids\ 3. Contraception management  P:  1. Will attempt IUD as contraception management.  Patient will discontinue Camrese the Sunday however.  Next week and will have her come back on the 18th for IUD insertion The patient is aware that this is a lower success rate in patients with fibroids, estimated 75% or less, but worth considering prior to more involved procedure such as endometrial ablation or hysteroscopic resection of myoma

## 2017-09-01 ENCOUNTER — Ambulatory Visit: Payer: BLUE CROSS/BLUE SHIELD | Admitting: Obstetrics and Gynecology

## 2017-09-01 ENCOUNTER — Encounter: Payer: Self-pay | Admitting: Obstetrics and Gynecology

## 2017-09-01 VITALS — BP 126/72 | HR 74 | Ht 65.5 in | Wt 237.2 lb

## 2017-09-01 DIAGNOSIS — Z302 Encounter for sterilization: Secondary | ICD-10-CM

## 2017-09-01 DIAGNOSIS — N921 Excessive and frequent menstruation with irregular cycle: Secondary | ICD-10-CM

## 2017-09-01 DIAGNOSIS — D251 Intramural leiomyoma of uterus: Secondary | ICD-10-CM | POA: Diagnosis not present

## 2017-09-01 DIAGNOSIS — D250 Submucous leiomyoma of uterus: Secondary | ICD-10-CM

## 2017-09-01 MED ORDER — MEDROXYPROGESTERONE ACETATE 10 MG PO TABS: 10.0000 mg | ORAL_TABLET | Freq: Every day | ORAL | 0 refills | Status: DC

## 2017-09-01 NOTE — Progress Notes (Signed)
[facility] Visit  09/01/2017            Patient name: Natalie Cordova MRN [id number]  Date of birth: 1975-11-13  CC & HPI:  Natalie Cordova is a 42 y.o. female presenting today for contraception management.and control of future menses. She was initially interested in IUD, but became 'spooked' and wanted to discuss other options as well. She has a history of irregular period on oral contraceptives, per chart review.  She has had lots of breakthrough bleeding oral contraceptive a an 84-day pack of Camrese She is currently on Camrese. No associated symptoms noted. No alleviating factors noted. She has a hx of uterine fibroids. Patient's last menstrual period was 08/22/2017. She is expecting her next period in the next month and a half.   ROS:  ROS (+) contraception management (-) pain All systems are negative except as noted in the HPI and PMH.   Pertinent History Reviewed:   Reviewed: Medical         Past Medical History:  Diagnosis Date  . Asthma   . Contraceptive management 03/07/2014  . Essential hypertension   . Fibroids 07/22/2017  . Migraine headache   . Osteoarthritis of both knees                               Surgical Hx:   No past surgical history on file. Medications: Reviewed & Updated - see associated section                       Current Outpatient Medications:  .  hydrochlorothiazide (HYDRODIURIL) 25 MG tablet, Take 25 mg by mouth daily., Disp: , Rfl:  .  ibuprofen (ADVIL,MOTRIN) 800 MG tablet, TAKE 1 TABLET UP TO THREE TIMES A DAY FOR PAIN AND INFLAMMATION PRN, Disp: , Rfl: 11 .  Levonorgestrel-Ethinyl Estradiol (AMETHIA,CAMRESE) 0.15-0.03 &0.01 MG tablet, TAKE 1 TABLET BY MOUTH DAILY., Disp: 91 tablet, Rfl: 2 .  metoprolol succinate (TOPROL-XL) 25 MG 24 hr tablet, TAKE ONE TABLET EACH DAY FOR HIGH BLOOD PRESSURE AND RAPID HEART BEAT., Disp: , Rfl: 3 .  PROAIR HFA 108 (90 Base) MCG/ACT inhaler, Inhale 2 puffs into the lungs every 4 (four) hours as  needed., Disp: , Rfl:  .  SUMAtriptan (IMITREX) 100 MG tablet, Take 1 tablet by mouth as needed. , Disp: , Rfl:  .  temazepam (RESTORIL) 30 MG capsule, Take 1 capsule by mouth at bedtime as needed. , Disp: , Rfl: 5   Social History: Reviewed -  reports that  has never smoked. she has never used smokeless tobacco.  Objective Findings:  Vitals: Blood pressure 126/72, pulse 74, height 5' 5.5" (1.664 m), weight 237 lb 3.2 oz (107.6 kg), last menstrual period 08/22/2017.  PHYSICAL EXAMINATION General appearance - alert, well appearing, and in no distress and oriented to person, place, and time Mental status - alert, oriented to person, place, and time, normal mood, behavior, speech, dress, motor activity, and thought processes Chest -  Heart -  Abdomen -  Breasts -  Skin -   PELVIC Ultrasound reviewed which shows a fibroid that is submucous that deforms the endometrial cavity slightly in a rather generous amount of endometrial cavity tissue on the recent ultrasound.  She has had a prior endometrial biopsy which which showed benign tissue Discussion: 1. Discussed with pt options of contraceptive management: IUD, endometrial ablation, OCPs, tubal ligation. Patient  does not plan on having any more babies. Pt's ultrasound shown and discussed as well. Tubal ligation with endometrial ablation discussed as an option to be done at the same time. Patient understands that this would be a permanent sterilization.  At end of discussion, pt had opportunity to ask questions and has no further questions at this time.   Specific discussion as noted above. Greater than 50% was spent in counseling and coordination of care with the patient.   Total time greater than: 25 minutes.   Assessment & Plan:   A:  1. breakthrough bleeding on OCP 2.  uterine fibroids 3.  Desire for permanent sterilization 4.  Biopsy-proven benign endometrial cavity  P:  1. Schedule endometrial ablation HTA, laparoscopic   Bilateral salpingectomy in 3 weeks at [facility], patient will stop birth control 1 week before procedure   By signing my name below, I, Izna Ahmed, attest that this documentation has been prepared under the direction and in the presence of Jonnie Kind, MD. Electronically Signed: Jabier Gauss, Medical Scribe. 09/01/17. 2:49 PM.  I personally performed the services described in this documentation, which was SCRIBED in my presence. The recorded information has been reviewed and considered accurate. It has been edited as necessary during review. Jonnie Kind, MD

## 2017-09-05 ENCOUNTER — Telehealth: Payer: Self-pay | Admitting: Obstetrics and Gynecology

## 2017-09-15 ENCOUNTER — Other Ambulatory Visit: Payer: Self-pay | Admitting: *Deleted

## 2017-09-18 ENCOUNTER — Encounter: Payer: Self-pay | Admitting: Cardiology

## 2017-09-18 ENCOUNTER — Ambulatory Visit: Payer: BLUE CROSS/BLUE SHIELD | Admitting: Cardiology

## 2017-09-18 VITALS — BP 121/81 | HR 85 | Ht 65.5 in | Wt 232.8 lb

## 2017-09-18 DIAGNOSIS — I10 Essential (primary) hypertension: Secondary | ICD-10-CM

## 2017-09-18 DIAGNOSIS — R002 Palpitations: Secondary | ICD-10-CM | POA: Diagnosis not present

## 2017-09-18 MED ORDER — METOPROLOL SUCCINATE ER 50 MG PO TB24: 50.0000 mg | ORAL_TABLET | Freq: Every day | ORAL | 3 refills | Status: DC

## 2017-09-18 NOTE — Progress Notes (Signed)
PCP: Lemmie Evens, MD  Clinic Note: Chief Complaint  Patient presents with  . Follow-up    Notable improvement  . Palpitations    HPI: Natalie Cordova is a 42 y.o. female with a PMH below who presents today for 46-month follow-up to reassess palpitations and hypertension she recently wore a 48-hour monitor -to reassess palpitations.  There was a concern about possible PSVT. Marland Kitchen   Natalie Cordova was initially seen back in April 2018, but her palpitations stabilized.  However she return for follow-up July 24, 2017 noting that her had gotten worse.  It did improve when started metoprolol still under heart rate can go up some.  Usually associated with anxiety or stress.  48-hour monitor ordered.  Recent Hospitalizations: none  Studies Personally Reviewed - (if available, images/films reviewed: From Epic Chart or Care Everywhere)  48-hour monitor:Essentially normal monitor. Predominant rhythm is normal sinus rhythm along with some sinus bradycardia and some sinus tachycardia with sinus arrhythmia.  Heart rate ranged from 57-125 bpm. Fastest rate was 155 bpm  Rare isolated PACs and PVCs.  Occasional dropped PACs..  No arrhythmia noted.  Interval History: Dementia returns here today saying that overall her palpitations have notably improved.  Pretty much from the time she was wearing the monitor forward, she really has not had much more of the palpitations.  She says that 1 or 2 times since her last visit she took an extra dose of metoprolol, but that was in the beginning and has not had to do it since.  She states that she may feel her heart rate going up a little bit but has not really gone into the range where she is concerned anymore.  The fastest she felt it was in the low 100s and she was able to calm it down with just some deep breaths.  No further symptoms of dizziness or wooziness, no cysts syncope/near syncope.  No TIA or amaurosis fugax.  No chest pain or  pressure/dyspnea with rest or exertion. No PND, orthopnea or edema.  No claudication.   ROS:  Pertinent Sx noted in HPI Review of Systems  Constitutional: Negative for malaise/fatigue.  Respiratory: Negative for shortness of breath.   Cardiovascular:       Per HPI  Neurological: Negative for dizziness and focal weakness.  Psychiatric/Behavioral: Negative for memory loss. The patient is not nervous/anxious and does not have insomnia.    I have reviewed and (if needed) personally updated the patient's problem list, medications, allergies, past medical and surgical history, social and family history.   Past Medical History:  Diagnosis Date  . Asthma   . Contraceptive management 03/07/2014  . Essential hypertension   . Fibroids 07/22/2017  . Migraine headache   . Osteoarthritis of both knees     History reviewed. No pertinent surgical history.  Current Meds  Medication Sig  . hydrochlorothiazide (HYDRODIURIL) 25 MG tablet Take 25 mg by mouth daily.  Marland Kitchen ibuprofen (ADVIL,MOTRIN) 800 MG tablet TAKE 1 TABLET UP TO THREE TIMES A DAY FOR PAIN AND INFLAMMATION PRN  . Levonorgestrel-Ethinyl Estradiol (AMETHIA,CAMRESE) 0.15-0.03 &0.01 MG tablet TAKE 1 TABLET BY MOUTH DAILY.  Marland Kitchen PROAIR HFA 108 (90 Base) MCG/ACT inhaler Inhale 2 puffs into the lungs every 4 (four) hours as needed.  . SUMAtriptan (IMITREX) 100 MG tablet Take 1 tablet by mouth as needed.   . temazepam (RESTORIL) 30 MG capsule Take 1 capsule by mouth at bedtime as needed.   . [DISCONTINUED] metoprolol succinate (TOPROL-XL)  25 MG 24 hr tablet TAKE ONE TABLET EACH DAY FOR HIGH BLOOD PRESSURE AND RAPID HEART BEAT.    No Known Allergies  Social History   Tobacco Use  . Smoking status: Never Smoker  . Smokeless tobacco: Never Used  Substance Use Topics  . Alcohol use: Yes    Comment: occ beer or wine  . Drug use: No   Social History   Social History Narrative   She has been married for 3-1/2 years. She lives with her husband  and daughter (32 year old). She has a high school education, but is not currently working. She does not exercise because of lack of motivation. She does not drink alcohol or smoke.    family history includes Cancer in her maternal grandfather; Healthy in her brother, father, and mother.  Wt Readings from Last 3 Encounters:  09/18/17 232 lb 12.8 oz (105.6 kg)  09/01/17 237 lb 3.2 oz (107.6 kg)  08/22/17 236 lb (107 kg)    PHYSICAL EXAM BP 121/81   Pulse 85   Ht 5' 5.5" (1.664 m)   Wt 232 lb 12.8 oz (105.6 kg)   LMP 08/22/2017   SpO2 99%   BMI 38.15 kg/m  Physical Exam  Constitutional: She is oriented to person, place, and time. She appears well-developed and well-nourished. No distress.  HENT:  Head: Normocephalic and atraumatic.  Neck: Neck supple. No hepatojugular reflux and no JVD present. Carotid bruit is not present.  Cardiovascular: Normal rate, regular rhythm, normal heart sounds and intact distal pulses.  No extrasystoles are present. PMI is not displaced. Exam reveals no gallop and no friction rub.  No murmur heard. Pulmonary/Chest: Effort normal and breath sounds normal. No respiratory distress. She has no wheezes. She has no rales.  Musculoskeletal: Normal range of motion. She exhibits no edema.  Neurological: She is alert and oriented to person, place, and time.  Psychiatric: She has a normal mood and affect. Her behavior is normal. Judgment and thought content normal.  Nursing note and vitals reviewed.   Adult ECG Report Not checked  Other studies Reviewed: Additional studies/ records that were reviewed today include:  Recent Labs:   Lab Results  Component Value Date   CREATININE 0.74 07/04/2017   BUN 9 07/04/2017   NA 138 07/04/2017   K 4.1 07/04/2017   CL 106 07/04/2017   CO2 18 (L) 07/04/2017   No results found for: CHOL, HDL, LDLCALC, LDLDIRECT, TRIG, CHOLHDL   ASSESSMENT / PLAN: Problem List Items Addressed This Visit    Essential hypertension  (Chronic)    Well-controlled with current dose of beta-blocker but should be able to tolerate higher dose.      Relevant Medications   metoprolol succinate (TOPROL-XL) 50 MG 24 hr tablet   Heart palpitations - Primary    Again back to her baseline with notable improvement.  We talked about these episodes coming and going in spells. Continue to recommend adequate hydration and minimize caffeine (although okay to have at least 1 cup of coffee a day.) Will increase Toprol to 50 mg and have her take it at nighttime.   Also discussed biofeedback techniques And Vagal Maneuvers.         Current medicines are reviewed at length with the patient today. (+/- concerns) n/a The following changes have been made: see below   Patient Instructions  MEDICATION INSTRUCTIONS  TOPROL XL 50 MG ( METOPROLOL ER SUCCINATE) AT BEDTIME      HYDRATE ( 10 ) 8-10 OZ  OF WATER DAILY.  OKAY TO HAVE A CUP OF COFFEE.    Your physician wants you to follow-up in [place].You will receive a reminder letter in the mail two months in advance. If you don't receive a letter, please call our office to schedule the follow-up appointment.   If you need a refill on your cardiac medications before your next appointment, please call your pharmacy.   Studies Ordered:   No orders of the defined types were placed in this encounter.     Glenetta Hew, M.D., M.S. Interventional Cardiologist   Pager # [phone] Phone # [phone] [address] [place], [place] [address]   Thank you for choosing [facility] at [facility]!!

## 2017-09-18 NOTE — Patient Instructions (Addendum)
MEDICATION INSTRUCTIONS  TOPROL XL 50 MG ( METOPROLOL ER SUCCINATE) AT BEDTIME      HYDRATE ( 10 ) 8-10 OZ OF WATER DAILY.  OKAY TO HAVE A CUP OF COFFEE.    Your physician wants you to follow-up in [place].You will receive a reminder letter in the mail two months in advance. If you don't receive a letter, please call our office to schedule the follow-up appointment.   If you need a refill on your cardiac medications before your next appointment, please call your pharmacy.

## 2017-09-20 ENCOUNTER — Encounter: Payer: Self-pay | Admitting: Cardiology

## 2017-09-20 NOTE — Assessment & Plan Note (Signed)
Again back to her baseline with notable improvement.  We talked about these episodes coming and going in spells. Continue to recommend adequate hydration and minimize caffeine (although okay to have at least 1 cup of coffee a day.) Will increase Toprol to 50 mg and have her take it at nighttime.   Also discussed biofeedback techniques And Vagal Maneuvers.

## 2017-09-20 NOTE — Assessment & Plan Note (Signed)
Well-controlled with current dose of beta-blocker but should be able to tolerate higher dose.

## 2017-10-07 ENCOUNTER — Other Ambulatory Visit: Payer: Self-pay | Admitting: Obstetrics & Gynecology

## 2017-10-07 ENCOUNTER — Telehealth: Payer: Self-pay | Admitting: *Deleted

## 2017-10-07 MED ORDER — MEDROXYPROGESTERONE ACETATE 10 MG PO TABS: 10.0000 mg | ORAL_TABLET | Freq: Every day | ORAL | 3 refills | Status: DC

## 2017-10-07 NOTE — Telephone Encounter (Signed)
Pharmacy request for 90 day supply of medroxyprogesterone sent in.

## 2017-10-09 ENCOUNTER — Other Ambulatory Visit: Payer: Self-pay | Admitting: Adult Health

## 2017-10-14 ENCOUNTER — Ambulatory Visit: Payer: BLUE CROSS/BLUE SHIELD | Admitting: Obstetrics and Gynecology

## 2017-10-14 ENCOUNTER — Encounter: Payer: Self-pay | Admitting: Obstetrics and Gynecology

## 2017-10-14 VITALS — BP 140/80 | HR 95 | Ht 65.2 in | Wt 239.0 lb

## 2017-10-14 DIAGNOSIS — N921 Excessive and frequent menstruation with irregular cycle: Secondary | ICD-10-CM

## 2017-10-14 DIAGNOSIS — D259 Leiomyoma of uterus, unspecified: Secondary | ICD-10-CM | POA: Insufficient documentation

## 2017-10-14 DIAGNOSIS — D251 Intramural leiomyoma of uterus: Secondary | ICD-10-CM

## 2017-10-14 DIAGNOSIS — D250 Submucous leiomyoma of uterus: Secondary | ICD-10-CM | POA: Diagnosis not present

## 2017-10-14 DIAGNOSIS — Z302 Encounter for sterilization: Secondary | ICD-10-CM

## 2017-10-14 NOTE — Progress Notes (Signed)
   [facility] Visit  @DATE @            Patient name: Natalie Cordova MRN [id number]  Date of birth: 1975-10-07  CC & HPI:  Natalie Cordova is a 42 y.o. female presenting today for follow-up on the desire for permanent sterilization and endometrial ablation.  Efforts to contact [facility] completed and we been able to schedule her for Friday, April 26 at 1:30 PM for hysteroscopy [facility] endometrial ablation as well as laparoscopic tubal sterilization.  We will be performing a bilateral salpingectomy  ROS:  ROS   Pertinent History Reviewed:   Reviewed: Significant for known uterine fibroids that she would be more amenable to HTA endometrial ablation Medical         Past Medical History:  Diagnosis Date  . Asthma   . Contraceptive management 03/07/2014  . Essential hypertension   . Fibroids 07/22/2017  . Migraine headache   . Osteoarthritis of both knees                               Surgical Hx:   History reviewed. No pertinent surgical history. Medications: Reviewed & Updated - see associated section                       Current Outpatient Medications:  .  hydrochlorothiazide (HYDRODIURIL) 25 MG tablet, Take 25 mg by mouth daily., Disp: , Rfl:  .  ibuprofen (ADVIL,MOTRIN) 800 MG tablet, TAKE 1 TABLET UP TO THREE TIMES A DAY FOR PAIN AND INFLAMMATION PRN, Disp: , Rfl: 11 .  Levonorgestrel-Ethinyl Estradiol (AMETHIA,CAMRESE) 0.15-0.03 &0.01 MG tablet, TAKE 1 TABLET BY MOUTH DAILY., Disp: 91 tablet, Rfl: 0 .  metoprolol succinate (TOPROL-XL) 50 MG 24 hr tablet, Take 1 tablet (50 mg total) by mouth at bedtime. Take with or immediately following a meal., Disp: 90 tablet, Rfl: 3 .  PROAIR HFA 108 (90 Base) MCG/ACT inhaler, Inhale 2 puffs into the lungs every 4 (four) hours as needed., Disp: , Rfl:  .  medroxyPROGESTERone (PROVERA) 10 MG tablet, Take 1 tablet (10 mg total) by mouth daily. (Patient not taking: Reported on 10/14/2017), Disp: 90 tablet, Rfl: 3 .  SUMAtriptan  (IMITREX) 100 MG tablet, Take 1 tablet by mouth as needed. , Disp: , Rfl:  .  temazepam (RESTORIL) 30 MG capsule, Take 1 capsule by mouth at bedtime as needed. , Disp: , Rfl: 5   Social History: Reviewed -  reports that she has never smoked. She has never used smokeless tobacco.  Objective Findings:  Vitals: Blood pressure 140/80, pulse 95, height 5' 5.2" (1.656 m), weight 239 lb (108.4 kg), last menstrual period 10/06/2017.  PHYSICAL EXAMINATION General appearance - alert, well appearing, and in no distress, oriented to person, place, and time and overweight Mental status - alert, oriented to person, place, and time, normal mood, behavior, speech, dress, motor activity, and thought processes Patient declines pelvic exam today  Assessment & Plan:   A:  1. Menorrhagia, uterine fibroids, desire for permanent sterilization  P:  1. Laparoscopic tubal sterilization to be performed as well as hysteroscopy D&C and HTA endometrial ablation 26 April at 1:30 PM.  Preoperative appointment at a later date as per patient request rather than exam today

## 2017-10-29 ENCOUNTER — Ambulatory Visit: Payer: BLUE CROSS/BLUE SHIELD | Admitting: Obstetrics and Gynecology

## 2017-10-29 ENCOUNTER — Encounter: Payer: Self-pay | Admitting: Obstetrics and Gynecology

## 2017-10-29 ENCOUNTER — Other Ambulatory Visit: Payer: Self-pay | Admitting: Obstetrics and Gynecology

## 2017-10-29 ENCOUNTER — Other Ambulatory Visit: Payer: Self-pay

## 2017-10-29 VITALS — BP 118/70 | HR 92 | Ht 65.5 in | Wt 231.6 lb

## 2017-10-29 DIAGNOSIS — N920 Excessive and frequent menstruation with regular cycle: Secondary | ICD-10-CM | POA: Diagnosis not present

## 2017-10-29 DIAGNOSIS — Z01818 Encounter for other preprocedural examination: Secondary | ICD-10-CM

## 2017-10-29 DIAGNOSIS — Z113 Encounter for screening for infections with a predominantly sexual mode of transmission: Secondary | ICD-10-CM | POA: Diagnosis not present

## 2017-10-29 DIAGNOSIS — D250 Submucous leiomyoma of uterus: Secondary | ICD-10-CM

## 2017-10-29 DIAGNOSIS — D251 Intramural leiomyoma of uterus: Secondary | ICD-10-CM

## 2017-10-29 NOTE — Progress Notes (Signed)
Expand All Collapse All   Patient ID: Natalie Cordova, female   DOB: 1975/11/15, 42 y.o.   MRN: [id number] Preoperative History and Physical  Natalie Cordova is a 42 y.o. G1P1001 here for surgical management of menorrhagia.   No significant preoperative concerns. Last pap was normal on 07/24/2016.  Proposed surgery: HYSTEROSCOPY WITH HYDROTHERMAL ABLATION and LAPAROSCOPIC BILATERAL SALPINGECTOMY scheduled for 26 April at [facility]      Past Medical History:  Diagnosis Date  . Asthma   . Contraceptive management 03/07/2014  . Essential hypertension   . Fibroids 07/22/2017  . Migraine headache   . Osteoarthritis of both knees    History reviewed. No pertinent surgical history.                 OB History  Gravida Para Term Preterm AB Living  1 1 1     1   SAB TAB Ectopic Multiple Live Births             1       # Outcome Date GA Lbr Len/2nd Weight Sex Delivery Anes PTL Lv  1 Term 05/15/93 [redacted]w[redacted]d  6 lb 11.5 oz (3.048 kg) F Vag-Spont   LIV  Patient denies any other pertinent gynecologic issues.   Current Outpatient Medications on File Prior to Visit  Medication Sig Dispense Refill  . cetirizine-pseudoephedrine (ZYRTEC-D) 5-120 MG tablet Take 1 tablet by mouth daily.    . fluticasone (FLONASE) 50 MCG/ACT nasal spray Place 1 spray into both nostrils daily as needed for allergies or rhinitis.    . hydrochlorothiazide (HYDRODIURIL) 25 MG tablet Take 25 mg by mouth daily.    Marland Kitchen ibuprofen (ADVIL,MOTRIN) 800 MG tablet Take 800 mg by mouth twice daily as needed for pain  11  . Levonorgestrel-Ethinyl Estradiol (AMETHIA,CAMRESE) 0.15-0.03 &0.01 MG tablet TAKE 1 TABLET BY MOUTH DAILY. 91 tablet 0  . medroxyPROGESTERone (PROVERA) 10 MG tablet Take 1 tablet (10 mg total) by mouth daily. 90 tablet 3  . metoprolol succinate (TOPROL-XL) 50 MG 24 hr tablet Take 1 tablet (50 mg total) by mouth at bedtime. Take with or immediately following a meal. 90 tablet 3  . PROAIR HFA 108 (90 Base)  MCG/ACT inhaler Inhale 2 puffs into the lungs every 4 (four) hours as needed for wheezing or shortness of breath.     . SUMAtriptan (IMITREX) 100 MG tablet Take 100 mg by mouth every 2 (two) hours as needed for migraine.     . temazepam (RESTORIL) 30 MG capsule Take 30 mg by mouth at bedtime as needed for sleep.   5   No current facility-administered medications on file prior to visit.    No Known Allergies  Social History:   reports that she has never smoked. She has never used smokeless tobacco. She reports that she drinks alcohol. She reports that she does not use drugs.       Family History  Problem Relation Age of Onset  . Healthy Mother   . Healthy Father   . Healthy Brother   . Cancer Maternal Grandfather        lung    Review of Systems: Noncontributory  PHYSICAL EXAMINATION: Blood pressure 118/70, pulse 92, height 5' 5.5" (1.664 m), weight 231 lb 9.6 oz (105.1 kg), last menstrual period 10/15/2017. PHYSICAL EXAMINATION General appearance - alert, well appearing, and in no distress, oriented to person, place, and time and overweight Mental status - alert, oriented to person, place, and time, normal mood, behavior,  speech, dress, motor activity, and thought processes, affect appropriate to mood Chest - clear to auscultation, no wheezes, rales or rhonchi, symmetric air entry Heart - normal rate, regular rhythm, normal S1, S2, no murmurs, rubs, clicks or gallops Abdomen - soft, nontender, nondistended, no masses or organomegaly Breasts - breasts appear normal, no suspicious masses, no skin or nipple changes or axillary nodes  PELVIC: External genitalia - normal Vagina - normal secretions,  Cervix - light spotting from cervix  Uterus - retroverted, 6-8 week size  Adnexa - normal Extremities - peripheral pulses normal, no pedal edema, no clubbing or cyanosis  Labs: No results found for this or any previous visit (from the past 336 hour(s)).  Imaging  Studies: ImagingResults  No results found.    Assessment:     Patient Active Problem List   Diagnosis Date Noted  . Fibroid uterus 10/14/2017  . Fibroids 07/22/2017  . Heart palpitations 10/25/2016  . Essential hypertension 10/25/2016  . Encounter for gynecological examination with Papanicolaou smear of cervix 07/24/2016  . Weight gain 07/24/2016  . Body mass index 37.0-37.9, adult 07/24/2016  . Weight loss counseling, encounter for 07/24/2016  . Encounter for other general counseling and advice on contraception 07/24/2016  . Pregnancy examination or test, negative result 05/29/2016  . Irregular intermenstrual bleeding 05/29/2016  . Contraceptive management 03/07/2014    Plan: 1. Patient will undergo surgical management with HYSTEROSCOPY WITH HYDROTHERMAL ABLATION and LAPAROSCOPIC BILATERAL SALPINGECTOMY on 11/07/2017.  2. Discontinue birth control pills on Sunday 11/02/2017 3.    By signing my name below, I, [name], attest that this documentation has been prepared under the direction and in the presence of Jonnie Kind, MD. Electronically Signed: [name], Medical Scribe. 10/29/17. 8:59 AM.  I personally performed the services described in this documentation, which was SCRIBED in my presence. The recorded information has been reviewed and considered accurate. It has been edited as necessary during review. Jonnie Kind, MD  Out of work from 4/25 til may 2

## 2017-10-29 NOTE — Addendum Note (Signed)
Addended by: Gaylyn Rong A on: 10/29/2017 10:06 AM   Modules accepted: Orders

## 2017-10-29 NOTE — Progress Notes (Signed)
Patient ID: Natalie Cordova, female   DOB: 1976-04-13, 42 y.o.   MRN: [id number] Preoperative History and Physical  Natalie Cordova is a 42 y.o. G1P1001 here for surgical management of menorrhagia.   No significant preoperative concerns. Last pap was normal on 07/24/2016.  Proposed surgery: HYSTEROSCOPY WITH HYDROTHERMAL ABLATION and LAPAROSCOPIC BILATERAL SALPINGECTOMY scheduled for 26 April at [facility]  Past Medical History:  Diagnosis Date  . Asthma   . Contraceptive management 03/07/2014  . Essential hypertension   . Fibroids 07/22/2017  . Migraine headache   . Osteoarthritis of both knees    History reviewed. No pertinent surgical history. OB History  Gravida Para Term Preterm AB Living  1 1 1     1   SAB TAB Ectopic Multiple Live Births          1    # Outcome Date GA Lbr Len/2nd Weight Sex Delivery Anes PTL Lv  1 Term 05/15/93 [redacted]w[redacted]d  6 lb 11.5 oz (3.048 kg) F Vag-Spont   LIV  Patient denies any other pertinent gynecologic issues.   Current Outpatient Medications on File Prior to Visit  Medication Sig Dispense Refill  . cetirizine-pseudoephedrine (ZYRTEC-D) 5-120 MG tablet Take 1 tablet by mouth daily.    . fluticasone (FLONASE) 50 MCG/ACT nasal spray Place 1 spray into both nostrils daily as needed for allergies or rhinitis.    . hydrochlorothiazide (HYDRODIURIL) 25 MG tablet Take 25 mg by mouth daily.    Marland Kitchen ibuprofen (ADVIL,MOTRIN) 800 MG tablet Take 800 mg by mouth twice daily as needed for pain  11  . Levonorgestrel-Ethinyl Estradiol (AMETHIA,CAMRESE) 0.15-0.03 &0.01 MG tablet TAKE 1 TABLET BY MOUTH DAILY. 91 tablet 0  . medroxyPROGESTERone (PROVERA) 10 MG tablet Take 1 tablet (10 mg total) by mouth daily. 90 tablet 3  . metoprolol succinate (TOPROL-XL) 50 MG 24 hr tablet Take 1 tablet (50 mg total) by mouth at bedtime. Take with or immediately following a meal. 90 tablet 3  . PROAIR HFA 108 (90 Base) MCG/ACT inhaler Inhale 2 puffs into the lungs every 4 (four) hours as  needed for wheezing or shortness of breath.     . SUMAtriptan (IMITREX) 100 MG tablet Take 100 mg by mouth every 2 (two) hours as needed for migraine.     . temazepam (RESTORIL) 30 MG capsule Take 30 mg by mouth at bedtime as needed for sleep.   5   No current facility-administered medications on file prior to visit.    No Known Allergies  Social History:   reports that she has never smoked. She has never used smokeless tobacco. She reports that she drinks alcohol. She reports that she does not use drugs.  Family History  Problem Relation Age of Onset  . Healthy Mother   . Healthy Father   . Healthy Brother   . Cancer Maternal Grandfather        lung    Review of Systems: Noncontributory  PHYSICAL EXAMINATION: Blood pressure 118/70, pulse 92, height 5' 5.5" (1.664 m), weight 231 lb 9.6 oz (105.1 kg), last menstrual period 10/15/2017. PHYSICAL EXAMINATION General appearance - alert, well appearing, and in no distress, oriented to person, place, and time and overweight Mental status - alert, oriented to person, place, and time, normal mood, behavior, speech, dress, motor activity, and thought processes, affect appropriate to mood Chest - clear to auscultation, no wheezes, rales or rhonchi, symmetric air entry Heart - normal rate, regular rhythm, normal S1, S2, no murmurs, rubs, clicks  or gallops Abdomen - soft, nontender, nondistended, no masses or organomegaly Breasts - breasts appear normal, no suspicious masses, no skin or nipple changes or axillary nodes  PELVIC: External genitalia - normal Vagina - normal secretions,  Cervix - light spotting from cervix  Uterus - retroverted, 6-8 week size  Adnexa - normal Extremities - peripheral pulses normal, no pedal edema, no clubbing or cyanosis  Labs: No results found for this or any previous visit (from the past 336 hour(s)).  Imaging Studies: No results found.  Assessment: Patient Active Problem List   Diagnosis Date Noted   . Fibroid uterus 10/14/2017  . Fibroids 07/22/2017  . Heart palpitations 10/25/2016  . Essential hypertension 10/25/2016  . Encounter for gynecological examination with Papanicolaou smear of cervix 07/24/2016  . Weight gain 07/24/2016  . Body mass index 37.0-37.9, adult 07/24/2016  . Weight loss counseling, encounter for 07/24/2016  . Encounter for other general counseling and advice on contraception 07/24/2016  . Pregnancy examination or test, negative result 05/29/2016  . Irregular intermenstrual bleeding 05/29/2016  . Contraceptive management 03/07/2014    Plan: 1. Patient will undergo surgical management with HYSTEROSCOPY WITH HYDROTHERMAL ABLATION and LAPAROSCOPIC BILATERAL SALPINGECTOMY on 11/07/2017.  2. Discontinue birth control pills on Sunday 11/02/2017 3.    By signing my name below, I, Natalie Cordova, attest that this documentation has been prepared under the direction and in the presence of Jonnie Kind, MD. Electronically Signed: Margit Cordova, Medical Scribe. 10/29/17. 8:59 AM.  I personally performed the services described in this documentation, which was SCRIBED in my presence. The recorded information has been reviewed and considered accurate. It has been edited as necessary during review. Jonnie Kind, MD  Out of work from 4/25 til may 2

## 2017-10-30 NOTE — Patient Instructions (Addendum)
Your procedure is scheduled on: Friday April 26 at 1:30 pm  Enter through the Main Entrance of [facility] at: 12:00 pm  Pick up the phone at the desk and dial [phone].  Call this number if you have problems the morning of surgery: [phone].  Remember: Do NOT eat food: after Midnight on Thursday April 25 Do NOT drink clear liquids after: 7:30 am Take these medicines the morning of surgery with a SIP OF WATER: Zyrtec  Imitrex, Flonase spray, Pro air inhaler if needed  STOP ALL VITAMINS AND SUPPLEMENTS 1 WEEK PRIOR TO SURGERY  DO NOT SMOKE DAY OF SURGERY  Do NOT wear jewelry (body piercing), metal hair clips/bobby pins, make-up, or nail polish. Do NOT wear lotions, powders, or perfumes.  You may wear deoderant. Do NOT shave for 48 hours prior to surgery. Do NOT bring valuables to the hospital. Contacts, dentures, or bridgework may not be worn into surgery.  Have a responsible adult drive you home and stay with you for 24 hours after your procedure

## 2017-11-01 LAB — GC/CHLAMYDIA PROBE AMP
CHLAMYDIA, DNA PROBE: NEGATIVE
NEISSERIA GONORRHOEAE BY PCR: NEGATIVE

## 2017-11-03 ENCOUNTER — Other Ambulatory Visit: Payer: Self-pay

## 2017-11-03 ENCOUNTER — Encounter (HOSPITAL_COMMUNITY): Payer: Self-pay

## 2017-11-03 ENCOUNTER — Encounter (HOSPITAL_COMMUNITY)
Admission: RE | Admit: 2017-11-03 | Discharge: 2017-11-03 | Disposition: A | Discharge status: Home / Self Care | Payer: BLUE CROSS/BLUE SHIELD | Source: Ambulatory Visit | Attending: Obstetrics and Gynecology | Admitting: Obstetrics and Gynecology

## 2017-11-03 DIAGNOSIS — Z01812 Encounter for preprocedural laboratory examination: Secondary | ICD-10-CM | POA: Insufficient documentation

## 2017-11-03 HISTORY — DX: Anxiety disorder, unspecified: F41.9

## 2017-11-03 HISTORY — DX: Depression, unspecified: F32.A

## 2017-11-03 HISTORY — DX: Palpitations: R00.2

## 2017-11-03 HISTORY — DX: Major depressive disorder, single episode, unspecified: F32.9

## 2017-11-03 LAB — CBC
HCT: 37 % (ref 36.0–46.0)
Hemoglobin: 12.3 g/dL (ref 12.0–15.0)
MCH: 28.9 pg (ref 26.0–34.0)
MCHC: 33.2 g/dL (ref 30.0–36.0)
MCV: 86.9 fL (ref 78.0–100.0)
Platelets: 367 10*3/uL (ref 150–400)
RBC: 4.26 MIL/uL (ref 3.87–5.11)
RDW: 14.4 % (ref 11.5–15.5)
WBC: 5.1 10*3/uL (ref 4.0–10.5)

## 2017-11-03 LAB — COMPREHENSIVE METABOLIC PANEL
ALK PHOS: 48 U/L (ref 38–126)
ALT: 15 U/L (ref 14–54)
AST: 17 U/L (ref 15–41)
Albumin: 3.7 g/dL (ref 3.5–5.0)
Anion gap: 9 (ref 5–15)
BUN: 11 mg/dL (ref 6–20)
CALCIUM: 8.9 mg/dL (ref 8.9–10.3)
CO2: 22 mmol/L (ref 22–32)
CREATININE: 0.69 mg/dL (ref 0.44–1.00)
Chloride: 109 mmol/L (ref 101–111)
GFR calc Af Amer: >60 mL/min (ref >60)
GLUCOSE: 93 mg/dL (ref 65–99)
POTASSIUM: 3.7 mmol/L (ref 3.5–5.1)
Sodium: 140 mmol/L (ref 135–145)
TOTAL PROTEIN: 7.5 g/dL (ref 6.5–8.1)
Total Bilirubin: 0.4 mg/dL (ref 0.3–1.2)

## 2017-11-03 LAB — TYPE AND SCREEN
ABO/RH(D): O POS
Antibody Screen: NEGATIVE

## 2017-11-03 LAB — ABO/RH: ABO/RH(D): O POS

## 2017-11-03 NOTE — Pre-Procedure Instructions (Signed)
Dr. Royce Macadamia viewed and okay 'd ekg

## 2017-11-04 LAB — SYPHILIS: RPR W/REFLEX TO RPR TITER AND TREPONEMAL ANTIBODIES, TRADITIONAL SCREENING AND DIAGNOSIS ALGORITHM: RPR Ser Ql: NONREACTIVE

## 2017-11-07 ENCOUNTER — Encounter (HOSPITAL_COMMUNITY): Payer: Self-pay | Admitting: Certified Registered Nurse Anesthetist

## 2017-11-07 ENCOUNTER — Ambulatory Visit (HOSPITAL_COMMUNITY): Payer: BLUE CROSS/BLUE SHIELD | Admitting: Certified Registered Nurse Anesthetist

## 2017-11-07 ENCOUNTER — Encounter (HOSPITAL_COMMUNITY)
Admission: AD | Disposition: A | Discharge status: Home / Self Care | Payer: Self-pay | Source: Ambulatory Visit | Attending: Obstetrics and Gynecology

## 2017-11-07 ENCOUNTER — Ambulatory Visit (HOSPITAL_COMMUNITY)
Admission: AD | Admit: 2017-11-07 | Discharge: 2017-11-07 | Disposition: A | Discharge status: Home / Self Care | Payer: BLUE CROSS/BLUE SHIELD | Source: Ambulatory Visit | Attending: Obstetrics and Gynecology | Admitting: Obstetrics and Gynecology

## 2017-11-07 ENCOUNTER — Other Ambulatory Visit: Payer: Self-pay

## 2017-11-07 DIAGNOSIS — E669 Obesity, unspecified: Secondary | ICD-10-CM | POA: Insufficient documentation

## 2017-11-07 DIAGNOSIS — Z6838 Body mass index (BMI) 38.0-38.9, adult: Secondary | ICD-10-CM | POA: Diagnosis not present

## 2017-11-07 DIAGNOSIS — J45909 Unspecified asthma, uncomplicated: Secondary | ICD-10-CM | POA: Insufficient documentation

## 2017-11-07 DIAGNOSIS — G43909 Migraine, unspecified, not intractable, without status migrainosus: Secondary | ICD-10-CM | POA: Diagnosis not present

## 2017-11-07 DIAGNOSIS — D251 Intramural leiomyoma of uterus: Secondary | ICD-10-CM | POA: Insufficient documentation

## 2017-11-07 DIAGNOSIS — Z793 Long term (current) use of hormonal contraceptives: Secondary | ICD-10-CM | POA: Diagnosis not present

## 2017-11-07 DIAGNOSIS — I10 Essential (primary) hypertension: Secondary | ICD-10-CM | POA: Diagnosis not present

## 2017-11-07 DIAGNOSIS — Z302 Encounter for sterilization: Secondary | ICD-10-CM | POA: Diagnosis not present

## 2017-11-07 DIAGNOSIS — N920 Excessive and frequent menstruation with regular cycle: Secondary | ICD-10-CM | POA: Insufficient documentation

## 2017-11-07 DIAGNOSIS — D250 Submucous leiomyoma of uterus: Secondary | ICD-10-CM | POA: Diagnosis not present

## 2017-11-07 DIAGNOSIS — Z79899 Other long term (current) drug therapy: Secondary | ICD-10-CM | POA: Insufficient documentation

## 2017-11-07 DIAGNOSIS — N921 Excessive and frequent menstruation with irregular cycle: Secondary | ICD-10-CM | POA: Diagnosis not present

## 2017-11-07 HISTORY — PX: LAPAROSCOPIC BILATERAL SALPINGECTOMY: SHX5889

## 2017-11-07 HISTORY — PX: HYSTEROSCOPY: SHX211

## 2017-11-07 LAB — HCG, SERUM, QUALITATIVE: Preg, Serum: NEGATIVE

## 2017-11-07 SURGERY — ABLATION, ENDOMETRIUM, HYSTEROSCOPIC
Anesthesia: General

## 2017-11-07 MED ORDER — ACETAMINOPHEN 160 MG/5ML PO SOLN
975.0000 mg | Freq: Once | ORAL | Status: AC
  Administered 2017-11-07: 975 mg via ORAL

## 2017-11-07 MED ORDER — DEXAMETHASONE SODIUM PHOSPHATE 10 MG/ML IJ SOLN
INTRAMUSCULAR | Status: DC | PRN
  Administered 2017-11-07: 4 mg via INTRAVENOUS

## 2017-11-07 MED ORDER — ROCURONIUM BROMIDE 100 MG/10ML IV SOLN
INTRAVENOUS | Status: DC | PRN
  Administered 2017-11-07: 40 mg via INTRAVENOUS

## 2017-11-07 MED ORDER — ACETAMINOPHEN 160 MG/5ML PO SOLN
ORAL | Status: AC
  Filled 2017-11-07: qty 40.6

## 2017-11-07 MED ORDER — SODIUM CHLORIDE 0.9 % IJ SOLN
INTRAMUSCULAR | Status: AC
  Filled 2017-11-07: qty 10

## 2017-11-07 MED ORDER — SCOPOLAMINE 1 MG/3DAYS TD PT72
MEDICATED_PATCH | TRANSDERMAL | Status: AC
  Filled 2017-11-07: qty 1

## 2017-11-07 MED ORDER — LIDOCAINE-EPINEPHRINE (PF) 1 %-1:200000 IJ SOLN
INTRAMUSCULAR | Status: DC | PRN
  Administered 2017-11-07 (×2): 10 mL

## 2017-11-07 MED ORDER — MIDAZOLAM HCL 2 MG/2ML IJ SOLN
INTRAMUSCULAR | Status: AC
  Filled 2017-11-07: qty 2

## 2017-11-07 MED ORDER — ONDANSETRON HCL 4 MG/2ML IJ SOLN
INTRAMUSCULAR | Status: AC
  Filled 2017-11-07: qty 2

## 2017-11-07 MED ORDER — LIDOCAINE-EPINEPHRINE (PF) 1 %-1:200000 IJ SOLN
INTRAMUSCULAR | Status: AC
  Filled 2017-11-07: qty 30

## 2017-11-07 MED ORDER — KETOROLAC TROMETHAMINE 30 MG/ML IJ SOLN
INTRAMUSCULAR | Status: AC
  Filled 2017-11-07: qty 1

## 2017-11-07 MED ORDER — SODIUM CHLORIDE 0.9 % IJ SOLN
INTRAMUSCULAR | Status: DC | PRN
  Administered 2017-11-07: 10 mL

## 2017-11-07 MED ORDER — SODIUM CHLORIDE 0.9 % IR SOLN
Status: DC | PRN
  Administered 2017-11-07: 1000 mL

## 2017-11-07 MED ORDER — TRAMADOL HCL 50 MG PO TABS: 50.0000 mg | ORAL_TABLET | Freq: Four times a day (QID) | ORAL | 0 refills | Status: DC | PRN

## 2017-11-07 MED ORDER — PROPOFOL 10 MG/ML IV BOLUS
INTRAVENOUS | Status: DC | PRN
  Administered 2017-11-07: 200 mg via INTRAVENOUS

## 2017-11-07 MED ORDER — MIDAZOLAM HCL 2 MG/2ML IJ SOLN
INTRAMUSCULAR | Status: DC | PRN
  Administered 2017-11-07: 2 mg via INTRAVENOUS

## 2017-11-07 MED ORDER — SODIUM CHLORIDE 0.9 % IJ SOLN
INTRAMUSCULAR | Status: AC
  Filled 2017-11-07: qty 100

## 2017-11-07 MED ORDER — FENTANYL CITRATE (PF) 100 MCG/2ML IJ SOLN
25.0000 ug | INTRAMUSCULAR | Status: DC | PRN
  Administered 2017-11-07: 50 ug via INTRAVENOUS
  Administered 2017-11-07 (×2): 25 ug via INTRAVENOUS

## 2017-11-07 MED ORDER — LACTATED RINGERS IV SOLN
INTRAVENOUS | Status: DC
  Administered 2017-11-07: 125 mL/h via INTRAVENOUS
  Administered 2017-11-07: 15:00:00 via INTRAVENOUS
  Administered 2017-11-07: 1000 mL via INTRAVENOUS

## 2017-11-07 MED ORDER — ONDANSETRON HCL 4 MG/2ML IJ SOLN
INTRAMUSCULAR | Status: DC | PRN
Start: 2017-11-07 — End: 2017-11-07
  Administered 2017-11-07: 4 mg via INTRAVENOUS

## 2017-11-07 MED ORDER — KETOROLAC TROMETHAMINE 30 MG/ML IJ SOLN
INTRAMUSCULAR | Status: DC | PRN
  Administered 2017-11-07: 30 mg via INTRAVENOUS

## 2017-11-07 MED ORDER — CEFAZOLIN SODIUM-DEXTROSE 2-4 GM/100ML-% IV SOLN
2.0000 g | INTRAVENOUS | Status: AC
  Administered 2017-11-07: 2 g via INTRAVENOUS

## 2017-11-07 MED ORDER — SUGAMMADEX SODIUM 200 MG/2ML IV SOLN
INTRAVENOUS | Status: AC
  Filled 2017-11-07: qty 2

## 2017-11-07 MED ORDER — FENTANYL CITRATE (PF) 100 MCG/2ML IJ SOLN
INTRAMUSCULAR | Status: AC
  Administered 2017-11-07: 50 ug via INTRAVENOUS
  Filled 2017-11-07: qty 2

## 2017-11-07 MED ORDER — SCOPOLAMINE 1 MG/3DAYS TD PT72
1.0000 | MEDICATED_PATCH | Freq: Once | TRANSDERMAL | Status: DC
  Administered 2017-11-07: 1.5 mg via TRANSDERMAL

## 2017-11-07 MED ORDER — LIDOCAINE HCL 1 % IJ SOLN
INTRAMUSCULAR | Status: AC
  Filled 2017-11-07: qty 20

## 2017-11-07 MED ORDER — VASOPRESSIN 20 UNIT/ML IV SOLN
INTRAVENOUS | Status: AC
  Filled 2017-11-07: qty 1

## 2017-11-07 MED ORDER — DEXAMETHASONE SODIUM PHOSPHATE 4 MG/ML IJ SOLN
INTRAMUSCULAR | Status: AC
  Filled 2017-11-07: qty 1

## 2017-11-07 MED ORDER — LIDOCAINE HCL (CARDIAC) PF 100 MG/5ML IV SOSY
PREFILLED_SYRINGE | INTRAVENOUS | Status: AC
  Filled 2017-11-07: qty 15

## 2017-11-07 MED ORDER — LIDOCAINE HCL (PF) 1 % IJ SOLN
INTRAMUSCULAR | Status: AC
  Filled 2017-11-07: qty 5

## 2017-11-07 MED ORDER — FENTANYL CITRATE (PF) 100 MCG/2ML IJ SOLN
INTRAMUSCULAR | Status: DC | PRN
  Administered 2017-11-07 (×5): 50 ug via INTRAVENOUS

## 2017-11-07 MED ORDER — LIDOCAINE HCL (CARDIAC) PF 100 MG/5ML IV SOSY
PREFILLED_SYRINGE | INTRAVENOUS | Status: DC | PRN
  Administered 2017-11-07: 50 mg via INTRAVENOUS

## 2017-11-07 MED ORDER — SUGAMMADEX SODIUM 200 MG/2ML IV SOLN
INTRAVENOUS | Status: DC | PRN
  Administered 2017-11-07: 200 mg via INTRAVENOUS

## 2017-11-07 MED ORDER — PROPOFOL 10 MG/ML IV BOLUS
INTRAVENOUS | Status: AC
  Filled 2017-11-07: qty 20

## 2017-11-07 MED ORDER — KETOROLAC TROMETHAMINE 10 MG PO TABS: 10.0000 mg | ORAL_TABLET | Freq: Four times a day (QID) | ORAL | 0 refills | Status: DC | PRN

## 2017-11-07 MED ORDER — FENTANYL CITRATE (PF) 250 MCG/5ML IJ SOLN
INTRAMUSCULAR | Status: AC
  Filled 2017-11-07: qty 5

## 2017-11-07 SURGICAL SUPPLY — 42 items
ADH SKN CLS APL DERMABOND .7 (GAUZE/BANDAGES/DRESSINGS) ×2
APL SKNCLS STERI-STRIP NONHPOA (GAUZE/BANDAGES/DRESSINGS) ×2
BAG SPEC RTRVL LRG 6X4 10 (ENDOMECHANICALS)
BARRIER ADHS 3X4 INTERCEED (GAUZE/BANDAGES/DRESSINGS) IMPLANT
BENZOIN TINCTURE PRP APPL 2/3 (GAUZE/BANDAGES/DRESSINGS) ×3 IMPLANT
BRR ADH 4X3 ABS CNTRL BYND (GAUZE/BANDAGES/DRESSINGS)
CATH ROBINSON RED A/P 16FR (CATHETERS) ×2 IMPLANT
DERMABOND ADVANCED (GAUZE/BANDAGES/DRESSINGS) ×1
DERMABOND ADVANCED .7 DNX12 (GAUZE/BANDAGES/DRESSINGS) ×2 IMPLANT
DRSG OPSITE POSTOP 3X4 (GAUZE/BANDAGES/DRESSINGS) ×2 IMPLANT
FILTER SMOKE EVAC LAPAROSHD (FILTER) ×2 IMPLANT
GLOVE BIOGEL PI IND STRL 7.0 (GLOVE) ×2 IMPLANT
GLOVE BIOGEL PI IND STRL 9 (GLOVE) ×4 IMPLANT
GLOVE BIOGEL PI INDICATOR 7.0 (GLOVE) ×1
GLOVE BIOGEL PI INDICATOR 9 (GLOVE) ×2
GLOVE SS PI 9.0 STRL (GLOVE) ×3 IMPLANT
GOWN STRL REUS W/TWL 2XL LVL3 (GOWN DISPOSABLE) ×3 IMPLANT
GOWN STRL REUS W/TWL LRG LVL3 (GOWN DISPOSABLE) ×6 IMPLANT
NEEDLE INSUFFLATION 120MM (ENDOMECHANICALS) ×3 IMPLANT
NS IRRIG 1000ML POUR BTL (IV SOLUTION) ×3 IMPLANT
PACK LAPAROSCOPY BASIN (CUSTOM PROCEDURE TRAY) ×3 IMPLANT
PACK TRENDGUARD 450 HYBRID PRO (MISCELLANEOUS) IMPLANT
PACK TRENDGUARD 600 HYBRD PROC (MISCELLANEOUS) IMPLANT
PACK VAGINAL MINOR WOMEN LF (CUSTOM PROCEDURE TRAY) ×3 IMPLANT
PAD OB MATERNITY 4.3X12.25 (PERSONAL CARE ITEMS) ×3 IMPLANT
POUCH SPECIMEN RETRIEVAL 10MM (ENDOMECHANICALS) IMPLANT
PROTECTOR NERVE ULNAR (MISCELLANEOUS) ×6 IMPLANT
SCISSORS LAP 5X35 DISP (ENDOMECHANICALS) IMPLANT
SET GENESYS HTA PROCERVA (MISCELLANEOUS) ×3 IMPLANT
SET IRRIG TUBING LAPAROSCOPIC (IRRIGATION / IRRIGATOR) IMPLANT
SHEARS HARMONIC ACE PLUS 36CM (ENDOMECHANICALS) ×1 IMPLANT
SLEEVE XCEL OPT CAN 5 100 (ENDOMECHANICALS) ×2 IMPLANT
STRIP CLOSURE SKIN 1/2X4 (GAUZE/BANDAGES/DRESSINGS) ×3 IMPLANT
STRIP CLOSURE SKIN 1/4X3 (GAUZE/BANDAGES/DRESSINGS) ×2 IMPLANT
SUT VIC AB 4-0 PS2 27 (SUTURE) ×3 IMPLANT
SUT VICRYL 0 UR6 27IN ABS (SUTURE) ×5 IMPLANT
TOWEL OR 17X24 6PK STRL BLUE (TOWEL DISPOSABLE) ×6 IMPLANT
TRAY FOLEY W/BAG SLVR 14FR (SET/KITS/TRAYS/PACK) ×1 IMPLANT
TRENDGUARD 450 HYBRID PRO PACK (MISCELLANEOUS)
TRENDGUARD 600 HYBRID PROC PK (MISCELLANEOUS) ×3
TROCAR XCEL NON-BLD 11X100MML (ENDOMECHANICALS) ×3 IMPLANT
TROCAR XCEL NON-BLD 5MMX100MML (ENDOMECHANICALS) ×3 IMPLANT

## 2017-11-07 NOTE — Anesthesia Procedure Notes (Signed)
Procedure Name: Intubation Date/Time: 11/07/2017 1:40 PM Performed by: Bufford Spikes, CRNA Pre-anesthesia Checklist: Patient identified, Emergency Drugs available, Suction available and Patient being monitored Patient Re-evaluated:Patient Re-evaluated prior to induction Oxygen Delivery Method: Circle system utilized Preoxygenation: Pre-oxygenation with 100% oxygen Induction Type: IV induction Ventilation: Mask ventilation without difficulty LMA: LMA inserted LMA Size: 4.0 Tube type: Oral Number of attempts: 1 Airway Equipment and Method: Stylet and Oral airway Placement Confirmation: ETT inserted through vocal cords under direct vision,  positive ETCO2 and breath sounds checked- equal and bilateral Tube secured with: Tape Dental Injury: Teeth and Oropharynx as per pre-operative assessment

## 2017-11-07 NOTE — Discharge Instructions (Signed)
DISCHARGE INSTRUCTIONS: HYSTEROSCOPY / ENDOMETRIAL ABLATION The following instructions have been prepared to help you care for yourself upon your return home.  May Remove Scop patch on or before  May take Ibuprofen after  May take stool softner while taking narcotic pain medication to prevent constipation.  Drink plenty of water.  Personal hygiene:  Use sanitary pads for vaginal drainage, not tampons.  Shower the day after your procedure.  NO tub baths, pools or Jacuzzis for 2-3 weeks.  Wipe front to back after using the bathroom.  Activity and limitations:  Do NOT drive or operate any equipment for 24 hours. The effects of anesthesia are still present and drowsiness may result.  Do NOT rest in bed all day.  Walking is encouraged.  Walk up and down stairs slowly.  You may resume your normal activity in one to two days or as indicated by your physician. Sexual activity: NO intercourse for at least 2 weeks after the procedure, or as indicated by your Doctor.  Diet: Eat a light meal as desired this evening. You may resume your usual diet tomorrow.  Return to Work: You may resume your work activities in one to two days or as indicated by Marine scientist.  What to expect after your surgery: Expect to have vaginal bleeding/discharge for 2-3 days and spotting for up to 10 days. It is not unusual to have soreness for up to 1-2 weeks. You may have a slight burning sensation when you urinate for the first day. Mild cramps may continue for a couple of days. You may have a regular period in 2-6 weeks.  Call your doctor for any of the following:  Excessive vaginal bleeding or clotting, saturating and changing one pad every hour.  Inability to urinate 6 hours after discharge from hospital.  Pain not relieved by pain medication.  Fever of 100.4 F or greater.  Unusual vaginal discharge or odor.  Return to office _________________Call for an appointment  ___________________ Patients signature: ______________________ Nurses signature ________________________  Post Anesthesia Care Unit [phone] INSTRUCTIONS: Laparoscopy  The following instructions have been prepared to help you care for yourself upon your return home today.  Wound care:  Do not get the incision wet for the first 24 hours. The incision should be kept clean and dry.  The Band-Aids or dressings may be removed the day after surgery.  Should the incision become sore, red, and swollen after the first week, check with your doctor.  Personal hygiene:  Shower the day after your procedure.  Activity and limitations:  Do NOT drive or operate any equipment today.  Do NOT lift anything more than 15 pounds for 2-3 weeks after surgery.  Do NOT rest in bed all day.  Walking is encouraged. Walk each day, starting slowly with 5-minute walks 3 or 4 times a day. Slowly increase the length of your walks.  Walk up and down stairs slowly.  Do NOT do strenuous activities, such as golfing, playing tennis, bowling, running, biking, weight lifting, gardening, mowing, or vacuuming for 2-4 weeks. Ask your doctor when it is okay to start.  Diet: Eat a light meal as desired this evening. You may resume your usual diet tomorrow.  Return to work: This is dependent on the type of work you do. For the most part you can return to a desk job within a week of surgery. If you are more active at work, please discuss this with your doctor.  What to expect after your surgery: You may  have a slight burning sensation when you urinate on the first day. You may have a very small amount of blood in the urine. Expect to have a small amount of vaginal discharge/light bleeding for 1-2 weeks. It is not unusual to have abdominal soreness and bruising for up to 2 weeks. You may be tired and need more rest for about 1 week. You may experience shoulder pain for 24-72 hours. Lying flat in bed may relieve  it.  Call your doctor for any of the following:  Develop a fever of 100.4 or greater  Inability to urinate 6 hours after discharge from hospital  Severe pain not relieved by pain medications  Persistent of heavy bleeding at incision site  Redness or swelling around incision site after a week  Increasing nausea or vomiting  Patient Signature________________________________________ Nurse Signature_________________________________________ Post Anesthesia Home Care Instructions  Activity: Get plenty of rest for the remainder of the day. A responsible individual must stay with you for 24 hours following the procedure.  For the next 24 hours, DO NOT: -Drive a car -Paediatric nurse -Drink alcoholic beverages -Take any medication unless instructed by your physician -Make any legal decisions or sign important papers.  Meals: Start with liquid foods such as gelatin or soup. Progress to regular foods as tolerated. Avoid greasy, spicy, heavy foods. If nausea and/or vomiting occur, drink only clear liquids until the nausea and/or vomiting subsides. Call your physician if vomiting continues.  Special Instructions/Symptoms: Your throat may feel dry or sore from the anesthesia or the breathing tube placed in your throat during surgery. If this causes discomfort, gargle with warm salt water. The discomfort should disappear within 24 hours.  If you had a scopolamine patch placed behind your ear for the management of post- operative nausea and/or vomiting:  1. The medication in the patch is effective for 72 hours, after which it should be removed.  Wrap patch in a tissue and discard in the trash. Wash hands thoroughly with soap and water. 2. You may remove the patch earlier than 72 hours if you experience unpleasant side effects which may include dry mouth, dizziness or visual disturbances. 3. Avoid touching the patch. Wash your hands with soap and water after contact with the patch.    Hysteroscopy Hysteroscopy is a procedure used for looking inside the womb (uterus). It may be done for various reasons, including:  To evaluate abnormal bleeding, fibroid (benign, noncancerous) tumors, polyps, scar tissue (adhesions), and possibly cancer of the uterus.  To look for lumps (tumors) and other uterine growths.  To look for causes of why a woman cannot get pregnant (infertility), causes of recurrent loss of pregnancy (miscarriages), or a lost intrauterine device (IUD).  To perform a sterilization by blocking the fallopian tubes from inside the uterus.  In this procedure, a thin, flexible tube with a tiny light and camera on the end of it (hysteroscope) is used to look inside the uterus. A hysteroscopy should be done right after a menstrual period to be sure you are not pregnant. LET [facility] CARE PROVIDER KNOW ABOUT:  Any allergies you have.  All medicines you are taking, including vitamins, herbs, eye drops, creams, and over-the-counter medicines.  Previous problems you or members of your family have had with the use of anesthetics.  Any blood disorders you have.  Previous surgeries you have had.  Medical conditions you have. RISKS AND COMPLICATIONS Generally, this is a safe procedure. However, as with any procedure, complications can occur. Possible complications  include:  Putting a hole in the uterus.  Excessive bleeding.  Infection.  Damage to the cervix.  Injury to other organs.  Allergic reaction to medicines.  Too much fluid used in the uterus for the procedure.  BEFORE THE PROCEDURE  Ask your health care provider about changing or stopping any regular medicines.  Do not take aspirin or blood thinners for 1 week before the procedure, or as directed by your health care provider. These can cause bleeding.  If you smoke, do not smoke for 2 weeks before the procedure.  In some cases, a medicine is placed in the cervix the day before the procedure.  This medicine makes the cervix have a larger opening (dilate). This makes it easier for the instrument to be inserted into the uterus during the procedure.  Do not eat or drink anything for at least 8 hours before the surgery.  Arrange for someone to take you home after the procedure. PROCEDURE  You may be given a medicine to relax you (sedative). You may also be given one of the following: ? A medicine that numbs the area around the cervix (local anesthetic). ? A medicine that makes you sleep through the procedure (general anesthetic).  The hysteroscope is inserted through the vagina into the uterus. The camera on the hysteroscope sends a picture to a TV screen. This gives the surgeon a good view inside the uterus.  During the procedure, air or a liquid is put into the uterus, which allows the surgeon to see better.  Sometimes, tissue is gently scraped from inside the uterus. These tissue samples are sent to a lab for testing. What to expect after the procedure  If you had a general anesthetic, you may be groggy for a couple hours after the procedure.  If you had a local anesthetic, you will be able to go home as soon as you are stable and feel ready.  You may have some cramping. This normally lasts for a couple days.  You may have bleeding, which varies from light spotting for a few days to menstrual-like bleeding for 3-7 days. This is normal.  If your test results are not back during the visit, make an appointment with your health care provider to find out the results. This information is not intended to replace advice given to you by your health care provider. Make sure you discuss any questions you have with your health care provider. Document Released: 10/07/2000 Document Revised: 12/07/2015 Document Reviewed: 01/28/2013 Elsevier Interactive Patient Education  2017 [employer].

## 2017-11-07 NOTE — H&P (Signed)
Signed      Expand All Collapse All       Show:Clear all [x] Manual[x] Template[x] Copied  Added by: [x] Jonnie Kind, MD[x] [name]   [] Hover for details   Patient ID: Natalie Cordova, female   DOB: 07-18-1975, 42 y.o.   MRN: [id number] Preoperative History and Physical  Natalie Cordova is a 42 y.o. G1P1001 here for surgical management of menorrhagia.   No significant preoperative concerns. Last pap was normal on 07/24/2016.  Proposed surgery: HYSTEROSCOPY WITH HYDROTHERMAL ABLATION and LAPAROSCOPIC BILATERAL SALPINGECTOMY scheduled for 26 April at [facility]      Past Medical History:  Diagnosis Date  . Asthma   . Contraceptive management 03/07/2014  . Essential hypertension   . Fibroids 07/22/2017  . Migraine headache   . Osteoarthritis of both knees    History reviewed. No pertinent surgical history.                 OB History  Gravida Para Term Preterm AB Living  1 1 1     1   SAB TAB Ectopic Multiple Live Births             1       # Outcome Date GA Lbr Len/2nd Weight Sex Delivery Anes PTL Lv  1 Term 05/15/93 [redacted]w[redacted]d  6 lb 11.5 oz (3.048 kg) F Vag-Spont   LIV  Patient denies any other pertinent gynecologic issues.         Current Outpatient Medications on File Prior to Visit  Medication Sig Dispense Refill  . cetirizine-pseudoephedrine (ZYRTEC-D) 5-120 MG tablet Take 1 tablet by mouth daily.    . fluticasone (FLONASE) 50 MCG/ACT nasal spray Place 1 spray into both nostrils daily as needed for allergies or rhinitis.    . hydrochlorothiazide (HYDRODIURIL) 25 MG tablet Take 25 mg by mouth daily.    Marland Kitchen ibuprofen (ADVIL,MOTRIN) 800 MG tablet Take 800 mg by mouth twice daily as needed for pain  11  . Levonorgestrel-Ethinyl Estradiol (AMETHIA,CAMRESE) 0.15-0.03 &0.01 MG tablet TAKE 1 TABLET BY MOUTH DAILY. 91 tablet 0  . medroxyPROGESTERone (PROVERA) 10 MG tablet Take 1 tablet (10 mg total) by mouth daily. 90 tablet 3  . metoprolol succinate  (TOPROL-XL) 50 MG 24 hr tablet Take 1 tablet (50 mg total) by mouth at bedtime. Take with or immediately following a meal. 90 tablet 3  . PROAIR HFA 108 (90 Base) MCG/ACT inhaler Inhale 2 puffs into the lungs every 4 (four) hours as needed for wheezing or shortness of breath.     . SUMAtriptan (IMITREX) 100 MG tablet Take 100 mg by mouth every 2 (two) hours as needed for migraine.     . temazepam (RESTORIL) 30 MG capsule Take 30 mg by mouth at bedtime as needed for sleep.   5   No current facility-administered medications on file prior to visit.    No Known Allergies  Social History:   reports that she has never smoked. She has never used smokeless tobacco. She reports that she drinks alcohol. She reports that she does not use drugs.       Family History  Problem Relation Age of Onset  . Healthy Mother   . Healthy Father   . Healthy Brother   . Cancer Maternal Grandfather        lung    Review of Systems: Noncontributory  PHYSICAL EXAMINATION: Blood pressure 118/70, pulse 92, height 5' 5.5" (1.664 m), weight 231 lb 9.6 oz (105.1 kg), last menstrual period  10/15/2017. PHYSICAL EXAMINATION General appearance - alert, well appearing, and in no distress, oriented to person, place, and time and overweight Mental status - alert, oriented to person, place, and time, normal mood, behavior, speech, dress, motor activity, and thought processes, affect appropriate to mood Chest - clear to auscultation, no wheezes, rales or rhonchi, symmetric air entry Heart - normal rate, regular rhythm, normal S1, S2, no murmurs, rubs, clicks or gallops Abdomen - soft, nontender, nondistended, no masses or organomegaly Breasts - breasts appear normal, no suspicious masses, no skin or nipple changes or axillary nodes  PELVIC: External genitalia - normal Vagina - normal secretions,  Cervix - light spotting from cervix  Uterus - retroverted, 6-8 week size  Adnexa - normal Extremities -  peripheral pulses normal, no pedal edema, no clubbing or cyanosis  Labs: No results found for this or any previous visit (from the past 336 hour(s)).  Imaging Studies: ImagingResults  No results found.    Assessment:     Patient Active Problem List   Diagnosis Date Noted  . Fibroid uterus 10/14/2017  . Fibroids 07/22/2017  . Heart palpitations 10/25/2016  . Essential hypertension 10/25/2016  . Encounter for gynecological examination with Papanicolaou smear of cervix 07/24/2016  . Weight gain 07/24/2016  . Body mass index 37.0-37.9, adult 07/24/2016  . Weight loss counseling, encounter for 07/24/2016  . Encounter for other general counseling and advice on contraception 07/24/2016  . Pregnancy examination or test, negative result 05/29/2016  . Irregular intermenstrual bleeding 05/29/2016  . Contraceptive management 03/07/2014    Plan: 1. Patient will undergo surgical management with HYSTEROSCOPY WITH HYDROTHERMAL ABLATION and LAPAROSCOPIC BILATERAL SALPINGECTOMY on 11/07/2017.  2. Discontinue birth control pills on Sunday 11/02/2017 3.    By signing my name below, I, [name], attest that this documentation has been prepared under the direction and in the presence of Jonnie Kind, MD. Electronically Signed: [name], Medical Scribe. 10/29/17. 8:59 AM.  I personally performed the services described in this documentation, which was SCRIBED in my presence. The recorded information has been reviewed and considered accurate. It has been edited as necessary during review. Jonnie Kind, MD  Out of work from 4/25 til may 2           Electronically signed by Jonnie Kind, MD at 10/29/2017 9:30 AM   reviewed and confirmed as unchanged on 11/07/17 by  Jonnie Kind, MD

## 2017-11-07 NOTE — Op Note (Signed)
11/07/2017  2:57 PM  PATIENT:  Natalie Cordova  42 y.o. female  PRE-OPERATIVE DIAGNOSIS:    POST-OPERATIVE DIAGNOSIS:    PROCEDURE:  Procedure(s): HYSTEROSCOPY WITH HYDROTHERMAL ABLATION (N/A) LAPAROSCOPIC BILATERAL SALPINGECTOMY (Bilateral)  SURGEON:  Surgeon(s) and Role:    Jonnie Kind, MD - Primary  PHYSICIAN ASSISTANT:   ASSISTANTS: none   ANESTHESIA:   local, general and paracervical block  EBL:  25 mL   BLOOD ADMINISTERED:none  DRAINS: none   LOCAL MEDICATIONS USED:  XYLOCAINE  and Amount: 20 mL ml  SPECIMEN:  Source of Specimen:  Fallopian tubes bilateral  DISPOSITION OF SPECIMEN:  PATHOLOGY  COUNTS:  YES  TOURNIQUET:  * No tourniquets in log *  DICTATION: .Dragon Dictation  PLAN OF CARE: Discharge to home after PACU  PATIENT DISPOSITION:  PACU - hemodynamically stable.   Delay start of Pharmacological VTE agent (>24hrs) due to surgical blood loss or risk of bleeding: not applicable Details of procedure: Patient was taken the operating room prepped and draped for abdominal and vaginal combined procedure.  Timeout was conducted and procedure confirmed by surgical team.  Ancef was administered x2 g.  The endometrial ablation was performed first. Endometrial ablation was performed by placement of speculum in the vagina identification of the cervix which was deviated slightly with patient right placing single-tooth tenaculum on the cervix, sounding the cervix to 9-1/2 cm, dilating to 25 [place] allowing introduction of the HTA camera which allowed visualization of the endometrial cavity.  Tubal ostia could be identified posteriorly on each side.  The anterior fundal portion of the fundus was deformed by a myoma intramural that distorted the endometrial cavity somewhat.  The endometrium was atrophic with no tissue warranting curettage.  This was consistent with the patient's recent discontinuation of oral contraceptives.  The 10-minute endometrial HTA thermal  ablation sequence was then activated and completed with a expected dermal appearance changes in the surface of the endometrium. On completion of this the cervix was regrasped with single-tooth tenaculum.  The tubal sterilization followed. Tubal ligation.  The infraumbilical vertical 1 cm skin incision was made as well as transverse suprapubic 1 cm incision in the right lower quadrant incision at the level parallel with the umbilicus due to patient obesity.  The Veress needle was carefully introduced through the umbilical site orienting toward the pelvis, and loss-of-resistance and water droplet technique confirmed to suggest an intraperitoneal location.  Pneumoperitoneum was achieved using 3 L CO2.  The 10 mm laparoscopic trocar was introduced through the umbilicus using direct visualization technique and there was no evidence of trauma suggested by intraperitoneal organ examination.  Suprapubic and right lower quadrant trochars were inserted under direct visualization.  Was taken to place patient in slight Trendelenburg position and then the fundus of the uterus could be identified.  There were multiple fibroids including a pedunculated fibroid in the right cornual area and enlarged fundus due to intramural fibroid.  The fallopian tube could be identified on the patient left was identified to its fimbriated end, and harmonic a 7 used to amputate the tube off of the mesal salpinx and it was extracted through the suprapubic site.  The right fallopian tube was treated similarly and extracted.  Hemostasis was confirmed.  120 cc of saline was instilled into the abdomen and with the patient placed in supine position with reversal of Trendelenburg position, care was taken to deflate the abdomen of all carbon dioxide.  The fascia was closed at the umbilicus after removal  of all trochars, using 0 Vicryl on a suprapubic and right lower quadrant 5 mm trochars did not require deep closure were closed subcuticularly with 4-0  Vicryl as well as the umbilicus and then Steri-Strips were applied.  Patient to recovery room in stable condition.  Marcaine was injected into the fascial closure site at the umbilicus.  Additionally the paracervical block had been placed during the HTA ablation as well estimated blood loss minimal.  Condition to recovery room good patient will be discharged home with some Toradol and tramadol

## 2017-11-07 NOTE — Op Note (Signed)
Please see the brief operative note for surgical details 

## 2017-11-07 NOTE — Anesthesia Preprocedure Evaluation (Signed)
Anesthesia Evaluation  Patient identified by MRN, date of birth, ID band Patient awake    Reviewed: Allergy & Precautions, H&P , Patient's Chart, lab work & pertinent test results, reviewed documented beta blocker date and time   Airway Mallampati: II  TM Distance: >3 FB Neck ROM: full    Dental no notable dental hx.    Pulmonary asthma ,    Pulmonary exam normal breath sounds clear to auscultation       Cardiovascular hypertension,  Rhythm:regular Rate:Normal     Neuro/Psych    GI/Hepatic   Endo/Other    Renal/GU      Musculoskeletal   Abdominal   Peds  Hematology   Anesthesia Other Findings   Reproductive/Obstetrics                             Anesthesia Physical Anesthesia Plan  ASA: II  Anesthesia Plan: General   Post-op Pain Management:    Induction: Intravenous  PONV Risk Score and Plan:   Airway Management Planned: LMA  Additional Equipment:   Intra-op Plan:   Post-operative Plan:   Informed Consent: I have reviewed the patients History and Physical, chart, labs and discussed the procedure including the risks, benefits and alternatives for the proposed anesthesia with the patient or authorized representative who has indicated his/her understanding and acceptance.   Dental Advisory Given  Plan Discussed with: CRNA and Surgeon  Anesthesia Plan Comments: ( )        Anesthesia Quick Evaluation

## 2017-11-07 NOTE — Transfer of Care (Signed)
Immediate Anesthesia Transfer of Care Note  Patient: Natalie Cordova  Procedure(s) Performed: HYSTEROSCOPY WITH HYDROTHERMAL ABLATION (N/A ) LAPAROSCOPIC BILATERAL SALPINGECTOMY (Bilateral )  Patient Location: PACU  Anesthesia Type:General  Level of Consciousness: awake, alert  and oriented  Airway & Oxygen Therapy: Patient Spontanous Breathing and Patient connected to nasal cannula oxygen  Post-op Assessment: Report given to RN, Post -op Vital signs reviewed and stable and Patient moving all extremities  Post vital signs: Reviewed and stable  Last Vitals:  Vitals Value Taken Time  BP    Temp    Pulse 79 11/07/2017  3:08 PM  Resp 20 11/07/2017  3:08 PM  SpO2 93 % 11/07/2017  3:08 PM  Vitals shown include unvalidated device data.  Last Pain:  Vitals:   11/07/17 1213  TempSrc: Oral  PainSc: 4       Patients Stated Pain Goal: 4 (60/04/59 9774)  Complications: No apparent anesthesia complications

## 2017-11-11 NOTE — Anesthesia Postprocedure Evaluation (Signed)
Anesthesia Post Note  Patient: KIDA DIGIULIO  Procedure(s) Performed: HYSTEROSCOPY WITH HYDROTHERMAL ABLATION (N/A ) LAPAROSCOPIC BILATERAL SALPINGECTOMY (Bilateral )     Patient location during evaluation: PACU Anesthesia Type: General Level of consciousness: awake and alert Pain management: pain level controlled Vital Signs Assessment: post-procedure vital signs reviewed and stable Respiratory status: spontaneous breathing, nonlabored ventilation, respiratory function stable and patient connected to nasal cannula oxygen Cardiovascular status: blood pressure returned to baseline and stable Postop Assessment: no apparent nausea or vomiting Anesthetic complications: no    Last Vitals:  Vitals:   11/07/17 1645 11/07/17 1745  BP: 137/89 (!) 149/84  Pulse: 71 60  Resp: 14 16  Temp: 36.6 C 36.8 C  SpO2: 98% 100%    Last Pain:  Vitals:   11/07/17 1745  TempSrc:   PainSc: 2    Pain Goal: Patients Stated Pain Goal: 3 (11/07/17 1745)               Lyndle Herrlich EDWARD

## 2017-11-12 ENCOUNTER — Encounter: Payer: BLUE CROSS/BLUE SHIELD | Admitting: Obstetrics and Gynecology

## 2017-11-12 ENCOUNTER — Ambulatory Visit (INDEPENDENT_AMBULATORY_CARE_PROVIDER_SITE_OTHER): Payer: BLUE CROSS/BLUE SHIELD | Admitting: Obstetrics and Gynecology

## 2017-11-12 ENCOUNTER — Encounter: Payer: Self-pay | Admitting: *Deleted

## 2017-11-12 ENCOUNTER — Encounter (HOSPITAL_COMMUNITY): Payer: Self-pay | Admitting: Obstetrics and Gynecology

## 2017-11-12 ENCOUNTER — Encounter: Payer: Self-pay | Admitting: Obstetrics and Gynecology

## 2017-11-12 ENCOUNTER — Telehealth: Payer: Self-pay | Admitting: *Deleted

## 2017-11-12 VITALS — BP 130/82 | HR 125 | Ht 65.5 in | Wt 238.4 lb

## 2017-11-12 DIAGNOSIS — Z4889 Encounter for other specified surgical aftercare: Secondary | ICD-10-CM

## 2017-11-12 DIAGNOSIS — Z9889 Other specified postprocedural states: Secondary | ICD-10-CM

## 2017-11-12 NOTE — Progress Notes (Addendum)
Patient ID: Natalie Cordova, female   DOB: 1975-08-07, 42 y.o.   MRN: [id number]   Subjective:  Natalie Cordova is a 42 y.o. female now 5 days status post HYSTEROSCOPY WITH HYDROTHERMAL ABLATION and LAPAROSCOPIC BILATERAL SALPINGECTOMY.   She is doing well overall. However, she does endorse some mild tenderness.  She does not think she will be able to go back to work until Monday and I think it is reasonable.  She has been given a note. Review of Systems Negative except mild tenderness   Diet:   normal   Bowel movements : constipated.  mild tenderness  Objective:  BP 130/82 (BP Location: Right Arm, Patient Position: Sitting, Cuff Size: Large)   Pulse (!) 125   Ht 5' 5.5" (1.664 m)   Wt 238 lb 6.4 oz (108.1 kg)   LMP 10/15/2017   BMI 39.07 kg/m  General:Well developed, well nourished.  No acute distress. Abdomen: Bowel sounds normal, soft, non-tender. Pelvic Exam: DEFERRED  Incision(s):   Healing well, no drainage, no erythema, no hernia, no swelling, no dehiscence,     Assessment:  Post-Op 5 days s/p HYSTEROSCOPY WITH HYDROTHERMAL ABLATION and LAPAROSCOPIC BILATERAL SALPINGECTOMY   Doing well postoperatively.  Other than moderate constipation   Plan:  1.Wound care discussed   2. . current medications.advised to get a suppository  3. Activity restrictions: intercourse for about 2 weeks 4. return to work: 1-2 weeks given note return next Monday.  5. Follow up PRN  By signing my name below, I, [name], attest that this documentation has been prepared under the direction and in the presence of Jonnie Kind, MD. Electronically Signed: [name], Medical Scribe. 11/12/17. 4:45 PM.  I personally performed the services described in this documentation, which was SCRIBED in my presence. The recorded information has been reviewed and considered accurate. It has been edited as necessary during review. Jonnie Kind, MD

## 2017-11-13 ENCOUNTER — Encounter: Payer: BLUE CROSS/BLUE SHIELD | Admitting: Obstetrics and Gynecology

## 2017-11-14 ENCOUNTER — Encounter: Payer: BLUE CROSS/BLUE SHIELD | Admitting: Obstetrics and Gynecology

## 2017-12-12 ENCOUNTER — Ambulatory Visit: Payer: BLUE CROSS/BLUE SHIELD | Admitting: Obstetrics and Gynecology

## 2017-12-12 ENCOUNTER — Encounter: Payer: Self-pay | Admitting: Obstetrics and Gynecology

## 2017-12-12 VITALS — BP 122/70 | HR 94 | Ht 65.5 in | Wt 234.0 lb

## 2017-12-12 DIAGNOSIS — Z9889 Other specified postprocedural states: Secondary | ICD-10-CM

## 2017-12-12 DIAGNOSIS — Z09 Encounter for follow-up examination after completed treatment for conditions other than malignant neoplasm: Secondary | ICD-10-CM

## 2017-12-12 NOTE — Progress Notes (Signed)
   Subjective:  Natalie Cordova is a 42 y.o. female now 5 weeks status post HYSTEROSCOPY WITH HYDROTHERMAL ABLATION and LAPAROSCOPIC BILATERAL SALPINGECTOMY.   Pt has started sexual activity with no pain. Pt notes there is still a little watery discharge on some days but sates that there isn't a problem. She denies any other symptoms at this time.   Review of Systems Negative except   Diet:   Normal   Bowel movements : normal.  The patient is not having any pain.   Objective:  BP 122/70 (BP Location: Right Arm, Patient Position: Sitting, Cuff Size: Large)   Pulse 94   Ht 5' 5.5" (1.664 m)   Wt 234 lb (106.1 kg)   BMI 38.35 kg/m  General:Well developed, well nourished.  No acute distress. Pelvic Exam: Exam deferred due to negative ROS unremarkable post op  Assessment:  Post-Op 5 weeks s/p HYSTEROSCOPY WITH HYDROTHERMAL ABLATION and LAPAROSCOPIC BILATERAL SALPINGECTOMY.     Doing well postoperatively.   Plan:  1.Wound care discussed  2. Current medications. 3. Activity restrictions: none 4. return to work: not applicable. 5. Follow up in 3 years.   By signing my name below, I, Samul Dada, attest that this documentation has been prepared under the direction and in the presence of Jonnie Kind, MD. Electronically Signed: Parker. 12/12/17. 11:13 AM.  I personally performed the services described in this documentation, which was SCRIBED in my presence. The recorded information has been reviewed and considered accurate. It has been edited as necessary during review. Jonnie Kind, MD

## 2017-12-19 ENCOUNTER — Encounter: Payer: Self-pay | Admitting: Obstetrics and Gynecology

## 2017-12-19 ENCOUNTER — Ambulatory Visit: Payer: BLUE CROSS/BLUE SHIELD | Admitting: Obstetrics and Gynecology

## 2017-12-19 VITALS — BP 105/69 | HR 89 | Ht 65.2 in | Wt 236.4 lb

## 2017-12-19 DIAGNOSIS — N760 Acute vaginitis: Secondary | ICD-10-CM

## 2017-12-19 DIAGNOSIS — B9689 Other specified bacterial agents as the cause of diseases classified elsewhere: Secondary | ICD-10-CM

## 2017-12-22 ENCOUNTER — Other Ambulatory Visit: Payer: Self-pay | Admitting: Obstetrics and Gynecology

## 2017-12-22 ENCOUNTER — Telehealth: Payer: Self-pay | Admitting: *Deleted

## 2017-12-22 MED ORDER — METRONIDAZOLE 500 MG PO TABS: 500.0000 mg | ORAL_TABLET | Freq: Two times a day (BID) | ORAL | 1 refills | Status: DC

## 2017-12-22 NOTE — Telephone Encounter (Signed)
Pt called stating that she came in for an appt on Friday and was told that she had BV. She states that Dr Glo Herring was going to send in a medication to treat this with one refill. Informed pt that I would send her request to him and she could check with her pharmacy later today. Pt verbalized understanding.

## 2017-12-22 NOTE — Progress Notes (Signed)
Metronidazole twice daily x7 days with 1 refill sent to pharmacy

## 2018-01-10 ENCOUNTER — Other Ambulatory Visit: Payer: Self-pay | Admitting: Women's Health

## 2018-01-10 NOTE — Progress Notes (Signed)
Late documentation of visit.  [facility] Visit  @DATE @            Patient name: Natalie Cordova MRN [id number]  Date of birth: 03-17-1976  CC & HPI:  Natalie Cordova is a 42 y.o. female presenting today for vag d/c.   ROS:  ROS   Pertinent History Reviewed:   Reviewed: Significant for  Medical         Past Medical History:  Diagnosis Date  . Anxiety   . Asthma   . Contraceptive management 03/07/2014  . Depression   . Essential hypertension    related to anxiety and stress per patient  . Fibroids 07/22/2017  . Heart palpitations   . Migraine headache   . Osteoarthritis of both knees                               Surgical Hx:    Past Surgical History:  Procedure Laterality Date  . HYSTEROSCOPY N/A 11/07/2017   Procedure: HYSTEROSCOPY WITH HYDROTHERMAL ABLATION;  Surgeon: Jonnie Kind, MD;  Location: [facility];  Service: Gynecology;  Laterality: N/A;  . LAPAROSCOPIC BILATERAL SALPINGECTOMY Bilateral 11/07/2017   Procedure: LAPAROSCOPIC BILATERAL SALPINGECTOMY;  Surgeon: Jonnie Kind, MD;  Location: [facility];  Service: Gynecology;  Laterality: Bilateral;  . LEEP  1995  . WISDOM TOOTH EXTRACTION     Medications: Reviewed & Updated - see associated section                       Current Outpatient Medications:  .  cetirizine-pseudoephedrine (ZYRTEC-D) 5-120 MG tablet, Take 1 tablet by mouth daily., Disp: , Rfl:  .  fluticasone (FLONASE) 50 MCG/ACT nasal spray, Place 1 spray into both nostrils daily as needed for allergies or rhinitis., Disp: , Rfl:  .  hydrochlorothiazide (HYDRODIURIL) 25 MG tablet, Take 25 mg by mouth daily., Disp: , Rfl:  .  ibuprofen (ADVIL,MOTRIN) 800 MG tablet, TAKE ONE TABLET UP TO 3 TIMES A DAY PRN FOR PAIN AND INFLAMMATION, Disp: , Rfl: 11 .  metoprolol succinate (TOPROL-XL) 50 MG 24 hr tablet, Take 1 tablet (50 mg total) by mouth at bedtime. Take with or immediately following a meal., Disp: 90 tablet, Rfl: 3 .  PROAIR HFA 108 (90 Base)  MCG/ACT inhaler, Inhale 2 puffs into the lungs every 4 (four) hours as needed for wheezing or shortness of breath. , Disp: , Rfl:  .  metroNIDAZOLE (FLAGYL) 500 MG tablet, Take 1 tablet (500 mg total) by mouth 2 (two) times daily., Disp: 14 tablet, Rfl: 1 .  SUMAtriptan (IMITREX) 100 MG tablet, Take 100 mg by mouth every 2 (two) hours as needed for migraine. , Disp: , Rfl:  .  temazepam (RESTORIL) 30 MG capsule, Take 30 mg by mouth at bedtime as needed for sleep. , Disp: , Rfl: 5   Social History: Reviewed -  reports that she has never smoked. She has never used smokeless tobacco.  Objective Findings:  Vitals: Blood pressure 105/69, pulse 89, height 5' 5.2" (1.656 m), weight 236 lb 6.4 oz (107.2 kg).  PHYSICAL EXAMINATION General appearance - alert, well appearing, and in no distress Mental status - alert, oriented to person, place, and time Chest -  Heart -  Abdomen -  Breasts -  Skin -   PELVIC External genitalia - normal Vulva -   Vagina - dishcharg present Cervix -  Uterus -   Adnexa - [place] - + BV Rectal - rectal exam not indicated    Assessment & Plan:   A:  1.  BV 2. Late documentation , some incomplete records present  P:  1.  metronidazole.  Jonnie Kind, MD 01/11/18

## 2018-04-01 ENCOUNTER — Ambulatory Visit: Payer: BLUE CROSS/BLUE SHIELD | Admitting: Advanced Practice Midwife

## 2018-04-21 ENCOUNTER — Other Ambulatory Visit: Payer: Self-pay | Admitting: Family Medicine

## 2018-04-21 DIAGNOSIS — Z1231 Encounter for screening mammogram for malignant neoplasm of breast: Secondary | ICD-10-CM

## 2018-06-05 ENCOUNTER — Ambulatory Visit
Admission: RE | Admit: 2018-06-05 | Discharge: 2018-06-05 | Disposition: A | Discharge status: Home / Self Care | Payer: BLUE CROSS/BLUE SHIELD | Source: Ambulatory Visit | Attending: Family Medicine | Admitting: Family Medicine

## 2018-06-05 DIAGNOSIS — Z1231 Encounter for screening mammogram for malignant neoplasm of breast: Secondary | ICD-10-CM

## 2018-06-09 ENCOUNTER — Other Ambulatory Visit: Payer: Self-pay | Admitting: Family Medicine

## 2018-06-09 DIAGNOSIS — R928 Other abnormal and inconclusive findings on diagnostic imaging of breast: Secondary | ICD-10-CM

## 2018-06-17 ENCOUNTER — Ambulatory Visit
Admission: RE | Admit: 2018-06-17 | Discharge: 2018-06-17 | Disposition: A | Discharge status: Home / Self Care | Payer: BLUE CROSS/BLUE SHIELD | Source: Ambulatory Visit | Attending: Family Medicine | Admitting: Family Medicine

## 2018-06-17 ENCOUNTER — Ambulatory Visit: Payer: BLUE CROSS/BLUE SHIELD

## 2018-06-17 DIAGNOSIS — R928 Other abnormal and inconclusive findings on diagnostic imaging of breast: Secondary | ICD-10-CM

## 2018-09-14 NOTE — Telephone Encounter (Signed)
Note sent to nurse. 

## 2018-10-20 ENCOUNTER — Telehealth: Payer: Self-pay | Admitting: Obstetrics and Gynecology

## 2018-10-20 ENCOUNTER — Encounter: Payer: Self-pay | Admitting: *Deleted

## 2018-10-20 NOTE — Telephone Encounter (Signed)
Spoke with pt. Pt had ablation last year. Pt is still having periods lasting 3-5 days and hurting in the bottom of stomach. She is interested in a hyst. Advised she could schedule a telephone or webex visit to discuss. Pt voiced understanding and call was transferred to front desk for appt. Hanna

## 2018-10-20 NOTE — Telephone Encounter (Signed)
Pt would like to have an appt to discuss some issues she is still having with her menstrual cycle after her ablation a year later. Please advise.

## 2018-10-21 ENCOUNTER — Other Ambulatory Visit: Payer: Self-pay

## 2018-10-21 ENCOUNTER — Encounter: Payer: Self-pay | Admitting: Obstetrics and Gynecology

## 2018-10-21 ENCOUNTER — Ambulatory Visit (INDEPENDENT_AMBULATORY_CARE_PROVIDER_SITE_OTHER): Payer: BLUE CROSS/BLUE SHIELD | Admitting: Obstetrics and Gynecology

## 2018-10-21 DIAGNOSIS — D219 Benign neoplasm of connective and other soft tissue, unspecified: Secondary | ICD-10-CM

## 2018-10-21 DIAGNOSIS — N946 Dysmenorrhea, unspecified: Secondary | ICD-10-CM

## 2018-10-21 NOTE — Progress Notes (Signed)
   TELEHEALTH VIRTUAL GYNECOLOGY VISIT ENCOUNTER NOTE  I connected with Natalie Cordova on 10/21/18 at  1:45 PM EDT by telephone at home and prior to contact she wasverified that I am speaking with the correct person using two identifiers.   I discussed the limitations, risks, security and privacy concerns of performing an evaluation and management service by telephone and the availability of in person appointments. I also discussed with the patient that there may be a patient responsible charge related to this service. The patient expressed understanding and agreed to proceed.   History:  Natalie Cordova is a 43 y.o. G1P1001 vaginal delivery x1 female being evaluated today for painful menstrual periods lasting 3 to 5 days since endometrial ablation.  In early 2019 she underwent endometrial ablation after declining consideration of IUD for heavy menses.  The menses improved only lasting 3 to 5 days but the pain that she also was experiencing has persisted and possibly even worse she loses 3 to 5 days with menses due to cramping and discomfort.  She read request being considered for hysterectomy.. She denies any abnormal vaginal discharge, bleeding, pelvic pain or other concerns.      Last.  Lasted 3 days. Last Pap smear 2018 would need repeat Pap prior to hysterectomy    Past Medical History:  Diagnosis Date  . Anxiety   . Asthma   . Contraceptive management 03/07/2014  . Depression   . Essential hypertension    related to anxiety and stress per patient  . Fibroids 07/22/2017  . Heart palpitations   . Migraine headache   . Osteoarthritis of both knees    Past Surgical History:  Procedure Laterality Date  . HYSTEROSCOPY N/A 11/07/2017   Procedure: HYSTEROSCOPY WITH HYDROTHERMAL ABLATION;  Surgeon: Jonnie Kind, MD;  Location: [facility];  Service: Gynecology;  Laterality: N/A;  . LAPAROSCOPIC BILATERAL SALPINGECTOMY Bilateral 11/07/2017   Procedure: LAPAROSCOPIC BILATERAL  SALPINGECTOMY;  Surgeon: Jonnie Kind, MD;  Location: [facility];  Service: Gynecology;  Laterality: Bilateral;  . LEEP  1995  . WISDOM TOOTH EXTRACTION     The following portions of the patient's history were reviewed and updated as appropriate: allergies, current medications, past family history, past medical history, past social history, past surgical history and problem list.   Health Maintenance:  Normal pap and negative HRHPV on 2018.  Normal mammogram on **06/17/2018.   Review of Systems:  Pertinent items noted in HPI and remainder of comprehensive ROS otherwise negative.  Physical Exam:  Tele phone visit  Labs and Imaging Ultrasound from January 2019 reviewed at which time the uterus measured 276 estimated grams weight, and some had some small intramural fibroids Assessment and Plan:     Dysmenorrhea Status post endometrial ablation with unsatisfactory response Patient remains unwilling to consider IUD      I discussed the assessment and treatment plan with the patient. The patient was provided an opportunity to ask questions and all were answered. The patient agreed with the plan and demonstrated an understanding of the instructions.   The patient was advised to call back or seek an in-person evaluation/go to the ED if the symptoms worsen or if the condition fails to improve as anticipated.  I provided 11 minutes of non-face-to-face time during this encounter. Plus subsequent documentation and review of the chart prior to visit total time greater than 20 minutes  Jonnie Kind, [place] for [facility], [place]

## 2018-11-02 ENCOUNTER — Telehealth: Payer: Self-pay | Admitting: *Deleted

## 2018-11-02 NOTE — Telephone Encounter (Signed)
11/02/2018 LMOM @ 11:22am to schedule appointment/D.Sabra Heck

## 2018-11-17 ENCOUNTER — Telehealth: Payer: Self-pay | Admitting: *Deleted

## 2018-11-17 NOTE — Telephone Encounter (Signed)
Left message on both phone to confirm appt - and discuss the format 11/19/18 at 4 pm with dr harding.

## 2018-11-18 ENCOUNTER — Telehealth: Payer: BLUE CROSS/BLUE SHIELD | Admitting: Cardiology

## 2018-11-18 NOTE — Telephone Encounter (Signed)
SEE OTHER NOTE

## 2018-11-18 NOTE — Telephone Encounter (Signed)
spoke to patient - patient would like  to reschedule appt .     TELEPHONE CALL NOTE  This patient has been deemed a candidate for follow-up tele-health visit to limit community exposure during the Covid-19 pandemic. I spoke with the patient via phone to discuss instructions.  The patient will receive a phone call 2-3 days prior to their E-Visit at which time consent will be verbally confirmed.   A Virtual Office Visit appointment type has been scheduled for 12/16/18 AT 4 PM with HARDING  the appointment notes - patient prefers  Telephone/ tetxt type.  I have  confirmed the patient is active in [place], [place], [place] 11/18/2018 9:12 AM

## 2018-12-14 ENCOUNTER — Telehealth: Payer: Self-pay | Admitting: Cardiology

## 2018-12-14 NOTE — Telephone Encounter (Signed)
Mychart, smartphone, consent,. Pre reg complete 12/14/18 AF

## 2018-12-16 ENCOUNTER — Telehealth (INDEPENDENT_AMBULATORY_CARE_PROVIDER_SITE_OTHER): Payer: BC Managed Care – PPO | Admitting: Cardiology

## 2018-12-16 ENCOUNTER — Telehealth: Payer: Self-pay | Admitting: *Deleted

## 2018-12-16 VITALS — HR 68 | Ht 65.0 in | Wt 240.0 lb

## 2018-12-16 DIAGNOSIS — I10 Essential (primary) hypertension: Secondary | ICD-10-CM | POA: Diagnosis not present

## 2018-12-16 DIAGNOSIS — R002 Palpitations: Secondary | ICD-10-CM

## 2018-12-16 MED ORDER — METOPROLOL SUCCINATE ER 50 MG PO TB24: 50.0000 mg | ORAL_TABLET | Freq: Every day | ORAL | 3 refills | Status: DC

## 2018-12-16 NOTE — Telephone Encounter (Signed)
SPOKE TO PATIENT - INSTRUCTION GIVEN FROM 12/16/18 TELEVISIT - MEDICATION E-SENT TO PHARMACY AVS SUMMARY WILL BE SENT VIA MYCHART PATIENT VERBALIZED UNDERSTANDING

## 2018-12-16 NOTE — Progress Notes (Signed)
Virtual Visit via Video Note   This visit type was conducted due to national recommendations for restrictions regarding the COVID-19 Pandemic (e.g. social distancing) in an effort to limit this patient's exposure and mitigate transmission in our community.  Due to her co-morbid illnesses, this patient is at least at moderate risk for complications without adequate follow up.  This format is felt to be most appropriate for this patient at this time.  All issues noted in this document were discussed and addressed.  A limited physical exam was performed with this format.  Please refer to the patient's chart for her consent to telehealth for [facility].   Patient has given verbal permission to conduct this visit via virtual appointment and to bill insurance 12/16/2018 6:45 PM     Evaluation Performed:  Follow-up visit  Date:  12/16/2018   ID:  Natalie Cordova, DOB [birth year], MRN [id number]  Patient Location: Home Provider Location: Home  PCP:  Lemmie Evens, MD  Cardiologist:  Glenetta Hew, MD  Electrophysiologist:  None   Chief Complaint:  ~ annual f/u for palpitations  History of Present Illness:    AZAELA CARACCI is a 43 y.o. female with PMH notable for HTN & palpitations who presents via audio/video conferencing for a telehealth visit today.  Natalie Cordova was last seen in March 2019 --.  Patient noted that her palpitations have improved and only on occasion and she had to take additional dose of metoprolol.  Interval History:  Natalie Cordova is doing well.   Palpitations are well controlled unless she misses a dose -- was somehow inadvertently changed to Metoprolol Tartrate 25 mg BID instead of Toprol 50 mg. If misses dose, feels a bit flushed & anxious with some fluttering.  Prefers long acting - worked much better (easier to take).  Cardiovascular ROS: no chest pain or dyspnea on exertion positive for - paroxysmal nocturnal dyspnea, rapid heart rate and only if misses  dose of Metoprolol negative for - edema, orthopnea, paroxysmal nocturnal dyspnea, shortness of breath or syncope/ neat syncope, TIA/amaurosis fugax  The patient does not have symptoms concerning for COVID-19 infection (fever, chills, cough, or new shortness of breath).  The patient is practicing social distancing.  Was out for 5-6 weeks from work - started back end of April  ROS:  Please see the history of present illness.    Review of Systems  Constitutional: Negative for malaise/fatigue.  HENT: Positive for congestion.   Respiratory: Positive for wheezing (allergies - occasional Albuterol).   Cardiovascular: Negative for claudication and leg swelling.  Gastrointestinal: Positive for constipation (is now on Linzess - controls it well). Negative for blood in stool, heartburn, melena, nausea and vomiting.  Genitourinary: Positive for hematuria (with UTI). Negative for dysuria (last week - UTI).       UTI last week - just finished Abx.  Musculoskeletal: Negative for falls.  Neurological: Negative for dizziness and focal weakness.  Endo/Heme/Allergies: Positive for environmental allergies (occasionally uses inhaler).   Past Medical History:  Diagnosis Date  . Anxiety   . Asthma   . Contraceptive management 03/07/2014  . Depression   . Essential hypertension    related to anxiety and stress per patient  . Fibroids 07/22/2017  . Heart palpitations   . Migraine headache   . Osteoarthritis of both knees    Past Surgical History:  Procedure Laterality Date  . HYSTEROSCOPY N/A 11/07/2017   Procedure: HYSTEROSCOPY WITH HYDROTHERMAL ABLATION;  Surgeon: Jonnie Kind, MD;  Location: [facility];  Service: Gynecology;  Laterality: N/A;  . LAPAROSCOPIC BILATERAL SALPINGECTOMY Bilateral 11/07/2017   Procedure: LAPAROSCOPIC BILATERAL SALPINGECTOMY;  Surgeon: Jonnie Kind, MD;  Location: [facility];  Service: Gynecology;  Laterality: Bilateral;  . LEEP  1995  . WISDOM TOOTH EXTRACTION       Current  Meds  Medication Sig  . cetirizine-pseudoephedrine (ZYRTEC-D) 5-120 MG tablet Take 1 tablet by mouth daily.  . fluticasone (FLONASE) 50 MCG/ACT nasal spray Place 1 spray into both nostrils daily as needed for allergies or rhinitis.  . hydrochlorothiazide (HYDRODIURIL) 25 MG tablet Take 25 mg by mouth daily.  Marland Kitchen ibuprofen (ADVIL,MOTRIN) 800 MG tablet TAKE ONE TABLET UP TO 3 TIMES A DAY PRN FOR PAIN AND INFLAMMATION  . KLOR-CON M20 20 MEQ tablet Take 1 tablet by mouth daily.  Marland Kitchen LINZESS 145 MCG CAPS capsule Take 1 capsule by mouth daily as needed.  Marland Kitchen PROAIR HFA 108 (90 Base) MCG/ACT inhaler Inhale 2 puffs into the lungs every 4 (four) hours as needed for wheezing or shortness of breath.   . SUMAtriptan (IMITREX) 100 MG tablet Take 100 mg by mouth every 2 (two) hours as needed for migraine.   . temazepam (RESTORIL) 30 MG capsule Take 30 mg by mouth at bedtime as needed for sleep.   . [DISCONTINUED] metoprolol tartrate (LOPRESSOR) 25 MG tablet Take 25 mg by mouth 2 (two) times daily.    -- Actually taking METOPROLOL TARTRATE 25 mg PO BID -- original Rx was METOPROLOL SUCCINATE 50 MG DAILY  Allergies:   Patient has no known allergies.   Social History   Tobacco Use  . Smoking status: Never Smoker  . Smokeless tobacco: Never Used  Substance Use Topics  . Alcohol use: Yes    Comment: occ beer or wine  . Drug use: No     Family Hx: The patient's family history includes Cancer in her maternal grandfather; Healthy in her brother, father, and mother. There is no history of Breast cancer.   Prior CV studies:   The following studies were reviewed today: . none:  Labs/Other Tests and Data Reviewed:    EKG:  No ECG reviewed.  Recent Labs: No results found for requested labs within last 8760 hours.   Recent Lipid Panel No results found for: CHOL, TRIG, HDL, CHOLHDL, LDLCALC, LDLDIRECT  Wt Readings from Last 3 Encounters:  12/16/18 240 lb (108.9 kg)  12/19/17 236 lb 6.4 oz (107.2 kg)   12/12/17 234 lb (106.1 kg)     Objective:    Vital Signs:  Pulse 68   Ht 5\' 5"  (1.651 m)   Wt 240 lb (108.9 kg)   BMI 39.94 kg/m   BP has been doing well.   VITAL SIGNS:  reviewed GEN:  Well nourished, well developed female in no acute distress. RESPIRATORY:  normal respiratory effort, symmetric expansion CARDIOVASCULAR:  no peripheral edema MUSCULOSKELETAL:  no obvious deformities. NEURO:  alert and oriented x 3, no obvious focal deficit PSYCH:  normal affect   ASSESSMENT & PLAN:    Problem List Items Addressed This Visit    Essential hypertension - Primary (Chronic)    Well controlled with current metoprolol dosing -- will convert back to Toprol XL -for ease of taking & avoid missing intermittent doses.      Relevant Medications   metoprolol succinate (TOPROL-XL) 50 MG 24 hr tablet   Heart palpitations (Chronic)    Pretty well controlled - but did much better with Metoprolol Succinate  ONCE daily dosing b/c she did not have breakthrough episodes in the waning periods of the intermittent dosing & especially if she happened to miss a dose or 2 here & there.  Will convert back to Metoprolol Succinate 50 mg PO Daily - (dispense as written); # 90 tab, 4 refills.  This will replace the existing Metoprolol TARTRATE 25 mg 2 x daily prescription.         COVID-19 Education: The signs and symptoms of COVID-19 were discussed with the patient and how to seek care for testing (follow up with PCP or arrange E-visit).   The importance of social distancing was discussed today.  Time:   Today, I have spent 15 minutes with the patient with telehealth technology discussing the above problems.     Medication Adjustments/Labs and Tests Ordered: Current medicines are reviewed at length with the patient today.  Concerns regarding medicines are outlined above.  Medication Instructions:   We will Change your Metoprolol (from PCP was Metoprolol Tartrate 25 mg 2 x daily) Refill to Toprol  (Metoprolol Succinate) 50 mg daily   Tests Ordered: No orders of the defined types were placed in this encounter.  none  Medication Changes: Meds ordered this encounter  Medications  . metoprolol succinate (TOPROL-XL) 50 MG 24 hr tablet    Sig: Take 1 tablet (50 mg total) by mouth at bedtime.    Dispense:  90 tablet    Refill:  3    DISCONTINUE METOPROLOL TARTRATE 25 MG  DOSAGE   See above: Metoprolol Succinate 50 mg PO daily (dispense as written - do not substitute), #90, 4 Refills.  Disposition:  Follow up in 1 year(s)    Signed, Glenetta Hew, MD  12/16/2018 6:45 PM    [facility]

## 2018-12-16 NOTE — Assessment & Plan Note (Signed)
Pretty well controlled - but did much better with Metoprolol Succinate ONCE daily dosing b/c she did not have breakthrough episodes in the waning periods of the intermittent dosing & especially if she happened to miss a dose or 2 here & there.  Will convert back to Metoprolol Succinate 50 mg PO Daily - (dispense as written); # 90 tab, 4 refills.  This will replace the existing Metoprolol TARTRATE 25 mg 2 x daily prescription.

## 2018-12-16 NOTE — Patient Instructions (Addendum)
Medication Instructions:   We will Change your Metoprolol (from PCP was Metoprolol Tartrate 25 mg 2 x daily) Refill to Toprol (Metoprolol Succinate) 50 mg daily -- just complete current Rx, then change over with next refil.   If you need a refill on your cardiac medications before your next appointment, please call your pharmacy.   Lab work: N/A  Testing/Procedures: N/A  Follow-Up: At [facility], you and your health needs are our priority.  As part of our continuing mission to provide you with exceptional heart care, we have created designated Provider Care Teams.  These Care Teams include your primary Cardiologist (physician) and Advanced Practice Providers (APPs -  Physician Assistants and Nurse Practitioners) who all work together to provide you with the care you need, when you need it. . You will need a follow up appointment in  10-12 months April - June 2021.  Please call our office 2 months in advance to schedule this appointment.  You may see Glenetta Hew, MD or one of the following Advanced Practice Providers on your designated Care Team:   . Rosaria Ferries, PA-C . Jory Sims, DNP, ANP  Any Other Special Instructions Will Be Listed Below (If Applicable).

## 2018-12-16 NOTE — Assessment & Plan Note (Signed)
Well controlled with current metoprolol dosing -- will convert back to Toprol XL -for ease of taking & avoid missing intermittent doses.

## 2019-01-22 ENCOUNTER — Telehealth: Payer: Self-pay | Admitting: Obstetrics and Gynecology

## 2019-01-22 NOTE — Telephone Encounter (Signed)
Pt informed that OR times are available, pt will look at schedule and let us know if/when she is ready to proceed with hyst.

## 2019-03-01 ENCOUNTER — Other Ambulatory Visit: Payer: Self-pay

## 2019-03-01 ENCOUNTER — Ambulatory Visit
Admission: EM | Admit: 2019-03-01 | Discharge: 2019-03-01 | Disposition: A | Discharge status: Home / Self Care | Payer: Worker's Compensation | Attending: Emergency Medicine | Admitting: Emergency Medicine

## 2019-03-01 DIAGNOSIS — X503XXA Overexertion from repetitive movements, initial encounter: Secondary | ICD-10-CM

## 2019-03-01 DIAGNOSIS — Z042 Encounter for examination and observation following work accident: Secondary | ICD-10-CM | POA: Diagnosis not present

## 2019-03-01 DIAGNOSIS — M5416 Radiculopathy, lumbar region: Secondary | ICD-10-CM | POA: Diagnosis not present

## 2019-03-01 MED ORDER — METAXALONE 800 MG PO TABS: 800.0000 mg | ORAL_TABLET | Freq: Three times a day (TID) | ORAL | 0 refills | Status: DC

## 2019-03-01 MED ORDER — NAPROXEN 500 MG PO TABS: 500.0000 mg | ORAL_TABLET | Freq: Two times a day (BID) | ORAL | 0 refills | Status: DC

## 2019-03-01 NOTE — ED Provider Notes (Signed)
[facility]    CSN: [id number] Arrival date & time: 03/01/19  0808      History   Chief Complaint Chief Complaint  Patient presents with  . Sciatica    HPI Natalie Cordova is a 43 y.o. female.   HPI  43 year old female presents with neck pain that she felt happened at work.  States she felt a twinge and has pain to her right back and radiates down her right leg.  Is graded at 6/10.  Does admit that since Wednesday 5 days prior to this visit it has improved over time.  She has been using nonsteroidal anti-inflammatory drugs.  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  This entails lifting and twisting on numerous occasions throughout the day.  Had no bowel or bladder dysfunction.        Past Medical History:  Diagnosis Date  . Anxiety   . Asthma   . Contraceptive management 03/07/2014  . Depression   . Essential hypertension    related to anxiety and stress per patient  . Fibroids 07/22/2017  . Heart palpitations   . Migraine headache   . Osteoarthritis of both knees     Patient Active Problem List   Diagnosis Date Noted  . Menorrhagia with regular cycle 11/07/2017  . Fibroid uterus 10/14/2017  . Fibroids 07/22/2017  . Heart palpitations 10/25/2016  . Essential hypertension 10/25/2016  . Encounter for gynecological examination with Papanicolaou smear of cervix 07/24/2016  . Weight gain 07/24/2016  . Body mass index 37.0-37.9, adult 07/24/2016  . Weight loss counseling, encounter for 07/24/2016  . Encounter for sterilization 07/24/2016  . Pregnancy examination or test, negative result 05/29/2016  . Irregular intermenstrual bleeding 05/29/2016  . Contraceptive management 03/07/2014    Past Surgical History:  Procedure Laterality Date  . HYSTEROSCOPY N/A 11/07/2017   Procedure: HYSTEROSCOPY WITH HYDROTHERMAL ABLATION;  Surgeon: Jonnie Kind, MD;  Location: [facility];  Service: Gynecology;  Laterality: N/A;  .  LAPAROSCOPIC BILATERAL SALPINGECTOMY Bilateral 11/07/2017   Procedure: LAPAROSCOPIC BILATERAL SALPINGECTOMY;  Surgeon: Jonnie Kind, MD;  Location: [facility];  Service: Gynecology;  Laterality: Bilateral;  . LEEP  1995  . WISDOM TOOTH EXTRACTION      OB History    Gravida  1   Para  1   Term  1   Preterm      AB      Living  1     SAB      TAB      Ectopic      Multiple      Live Births  1            Home Medications    Prior to Admission medications   Medication Sig Start Date End Date Taking? Authorizing Provider  cetirizine-pseudoephedrine (ZYRTEC-D) 5-120 MG tablet Take 1 tablet by mouth daily.    [provider]  fluticasone (FLONASE) 50 MCG/ACT nasal spray Place 1 spray into both nostrils daily as needed for allergies or rhinitis.    [provider]  hydrochlorothiazide (HYDRODIURIL) 25 MG tablet Take 25 mg by mouth daily.    [provider]  ibuprofen (ADVIL,MOTRIN) 800 MG tablet TAKE ONE TABLET UP TO 3 TIMES A DAY PRN FOR PAIN AND INFLAMMATION 11/15/17   [provider]  KLOR-CON M20 20 MEQ tablet Take 1 tablet by mouth daily. 10/30/18   [provider]  LINZESS 145 MCG CAPS capsule Take 1 capsule by  mouth daily as needed. 07/11/18   [provider]  metaxalone (SKELAXIN) 800 MG tablet Take 1 tablet (800 mg total) by mouth 3 (three) times daily. 03/01/19   Lorin Picket, PA-C  metoprolol succinate (TOPROL-XL) 50 MG 24 hr tablet Take 1 tablet (50 mg total) by mouth at bedtime. 12/16/18   Leonie Man, MD  naproxen (NAPROSYN) 500 MG tablet Take 1 tablet (500 mg total) by mouth 2 (two) times daily. 03/01/19   Lorin Picket, PA-C  PROAIR HFA [phone] Base) MCG/ACT inhaler Inhale 2 puffs into the lungs every 4 (four) hours as needed for wheezing or shortness of breath.  09/18/16   [provider]  SUMAtriptan (IMITREX) 100 MG tablet Take 100 mg by mouth every 2 (two) hours as needed for migraine.   08/23/16   [provider]  temazepam (RESTORIL) 30 MG capsule Take 30 mg by mouth at bedtime as needed for sleep.  10/04/16   [provider]    Family History Family History  Problem Relation Age of Onset  . Healthy Mother   . Healthy Father   . Healthy Brother   . Cancer Maternal Grandfather        lung  . Breast cancer Neg Hx     Social History Social History   Tobacco Use  . Smoking status: Never Smoker  . Smokeless tobacco: Never Used  Substance Use Topics  . Alcohol use: Yes    Comment: occ beer or wine  . Drug use: No     Allergies   Patient has no known allergies.   Review of Systems Review of Systems  Constitutional: Positive for activity change. Negative for appetite change, chills, diaphoresis, fatigue and fever.  Musculoskeletal: Positive for back pain and myalgias.  All other systems reviewed and are negative.    Physical Exam Triage Vital Signs ED Triage Vitals  Enc Vitals Group     BP 03/01/19 0828 133/88     Pulse Rate 03/01/19 0828 81     Resp 03/01/19 0828 17     Temp 03/01/19 0828 98.3 F (36.8 C)     Temp Source 03/01/19 0828 Oral     SpO2 03/01/19 0828 100 %     Weight 03/01/19 0830 238 lb (108 kg)     Height 03/01/19 0830 5' 5.5" (1.664 m)     Head Circumference --      Peak Flow --      Pain Score 03/01/19 0830 6     Pain Loc --      Pain Edu? --      Excl. in [place]? --    No data found.  Updated Vital Signs BP 133/88 (BP Location: Left Arm)   Pulse 81   Temp 98.3 F (36.8 C) (Oral)   Resp 17   Ht 5' 5.5" (1.664 m)   Wt 238 lb (108 kg)   SpO2 100%   BMI 39.00 kg/m   Visual Acuity Right Eye Distance:   Left Eye Distance:   Bilateral Distance:    Right Eye Near:   Left Eye Near:    Bilateral Near:     Physical Exam Vitals signs and nursing note reviewed.  Constitutional:      General: She is not in acute distress.    Appearance: Normal appearance. She is not ill-appearing, toxic-appearing or  diaphoretic.  HENT:     Head: Normocephalic and atraumatic.  Eyes:     Conjunctiva/sclera: Conjunctivae normal.  Pupils: Pupils are equal, round, and reactive to light.  Neck:     Musculoskeletal: Normal range of motion and neck supple.  Musculoskeletal: Normal range of motion.        General: Tenderness and signs of injury present. No swelling or deformity.     Comments: Lamination of the lumbar spine shows a level pelvis in stance.  Able to forward flex with her hands to the level of her ankles.  Lateral flexion is comfortable bilaterally.  Thoracic rotation is painful at the extremes of right rotation.  She does have tenderness over the sciatic notch on the right as well as the right sacroiliac area.  Will toe and heel walk normally.  EHL peroneal and anterior tibialis muscles are strong to clinical testing.  Patient has a negative straight leg raise test in the seated position to 90 degrees bilaterally.  Sensation is intact the lower extremities  Skin:    General: Skin is warm and dry.  Neurological:     General: No focal deficit present.     Mental Status: She is alert and oriented to person, place, and time.  Psychiatric:        Mood and Affect: Mood normal.        Behavior: Behavior normal.        Thought Content: Thought content normal.        Judgment: Judgment normal.      UC Treatments / Results  Labs (all labs ordered are listed, but only abnormal results are displayed) Labs Reviewed - No data to display  EKG   Radiology No results found.  Procedures Procedures (including critical care time)  Medications Ordered in UC Medications - No data to display  Initial Impression / Assessment and Plan / UC Course  I have reviewed the triage vital signs and the nursing notes.  Pertinent labs & imaging results that were available during my care of the patient were reviewed by me and considered in my medical decision making (see chart for details).   The patient is  recovering from a acute lumbar strain.  Is a radicular component which is also improving but shows no neurological deficits..I will prescribe Skelaxin for muscle relaxation and the Naprosyn.  Should avoid symptoms much as possible.  Use heat or ice every 2 hours as necessary for discomfort.  Given cautions of use of Skelaxin and also of Naprosyn.  She will take Naprosyn with meals.  She has been working and may continue to work with certain restrictions of no frequent bending prolonged standing.  She is not improving she should follow-up with her primary care physician.   Final Clinical Impressions(s) / UC Diagnoses   Final diagnoses:  Lumbar radiculitis     Discharge Instructions     Avoid symptoms as much as possible.  Heat or ice minutes out of every 2 hours as necessary for discomfort.Use  Caution while taking muscle relaxers.  Do not perform activities requiring concentration or judgment and do not drive.    ED Prescriptions    Medication Sig Dispense Auth. Provider   metaxalone (SKELAXIN) 800 MG tablet Take 1 tablet (800 mg total) by mouth 3 (three) times daily. 21 tablet Crecencio Mc P, PA-C   naproxen (NAPROSYN) 500 MG tablet Take 1 tablet (500 mg total) by mouth 2 (two) times daily. 30 tablet Lorin Picket, PA-C     Controlled Substance Prescriptions [place] Controlled Substance Registry consulted? Not Applicable   Lorin Picket, PA-C 03/01/19 2000

## 2019-03-01 NOTE — ED Triage Notes (Signed)
Pt states she strained her back at work on Wednesday. Felt a "twinge" and has pain to right lower back and running down her right leg. 6/10

## 2019-03-01 NOTE — Discharge Instructions (Addendum)
Avoid symptoms as much as possible.  Heat or ice minutes out of every 2 hours as necessary for discomfort.Use  Caution while taking muscle relaxers.  Do not perform activities requiring concentration or judgment and do not drive.

## 2019-04-07 ENCOUNTER — Encounter: Payer: BC Managed Care – PPO | Admitting: Obstetrics and Gynecology

## 2019-05-03 DIAGNOSIS — M5416 Radiculopathy, lumbar region: Secondary | ICD-10-CM | POA: Insufficient documentation

## 2019-05-03 DIAGNOSIS — M545 Low back pain, unspecified: Secondary | ICD-10-CM | POA: Insufficient documentation

## 2019-05-03 DIAGNOSIS — M5136 Other intervertebral disc degeneration, lumbar region: Secondary | ICD-10-CM | POA: Insufficient documentation

## 2019-05-03 DIAGNOSIS — M51369 Other intervertebral disc degeneration, lumbar region without mention of lumbar back pain or lower extremity pain: Secondary | ICD-10-CM | POA: Insufficient documentation

## 2019-05-03 DIAGNOSIS — Y990 Civilian activity done for income or pay: Secondary | ICD-10-CM | POA: Insufficient documentation

## 2019-05-04 ENCOUNTER — Other Ambulatory Visit: Payer: Self-pay | Admitting: Family Medicine

## 2019-05-04 DIAGNOSIS — Z1231 Encounter for screening mammogram for malignant neoplasm of breast: Secondary | ICD-10-CM

## 2019-05-25 DIAGNOSIS — M419 Scoliosis, unspecified: Secondary | ICD-10-CM | POA: Insufficient documentation

## 2019-06-22 ENCOUNTER — Ambulatory Visit
Admission: RE | Admit: 2019-06-22 | Discharge: 2019-06-22 | Disposition: A | Discharge status: Home / Self Care | Payer: 59 | Source: Ambulatory Visit | Attending: Family Medicine | Admitting: Family Medicine

## 2019-06-22 ENCOUNTER — Other Ambulatory Visit: Payer: Self-pay

## 2019-06-22 DIAGNOSIS — Z1231 Encounter for screening mammogram for malignant neoplasm of breast: Secondary | ICD-10-CM

## 2019-07-17 ENCOUNTER — Ambulatory Visit
Admission: EM | Admit: 2019-07-17 | Discharge: 2019-07-17 | Disposition: A | Discharge status: Home / Self Care | Payer: 59 | Attending: Emergency Medicine | Admitting: Emergency Medicine

## 2019-07-17 ENCOUNTER — Other Ambulatory Visit: Payer: Self-pay

## 2019-07-17 DIAGNOSIS — K029 Dental caries, unspecified: Secondary | ICD-10-CM

## 2019-07-17 DIAGNOSIS — K047 Periapical abscess without sinus: Secondary | ICD-10-CM

## 2019-07-17 MED ORDER — FLUTICASONE PROPIONATE 50 MCG/ACT NA SUSP: 1.0000 | Freq: Every day | NASAL | 0 refills | Status: DC

## 2019-07-17 MED ORDER — AMOXICILLIN-POT CLAVULANATE 875-125 MG PO TABS: 1.0000 | ORAL_TABLET | Freq: Two times a day (BID) | ORAL | 0 refills | Status: DC

## 2019-07-17 MED ORDER — IBUPROFEN 800 MG PO TABS: 800.0000 mg | ORAL_TABLET | Freq: Three times a day (TID) | ORAL | 0 refills | Status: DC

## 2019-07-17 NOTE — ED Provider Notes (Signed)
[facility]    CSN: [id number] Arrival date & time: 07/17/19  1322      History   Chief Complaint Chief Complaint  Patient presents with  . Appointment  . Headache  . Otalgia    HPI Natalie Cordova is a 44 y.o. female.   Natalie Cordova is a 44 y.o. female who presents with left-sided neck and ear pain for the past 3 days.  Denies a precipitating event or trauma.  Localizes pain to left neck and ear pain area.  Has tried OTC analgesics without relief.  Worse with chewing.  Plan to see her dentist in the next 3 days.   Denies similar symptoms in the past.  Denies fever, chills, dysphagia, odynophagia, oral or neck swelling, nausea, vomiting, chest pain, SOB.    The history is provided by the patient. No language interpreter was used.  Headache Associated symptoms: ear pain   Otalgia Associated symptoms: headaches     Past Medical History:  Diagnosis Date  . Anxiety   . Asthma   . Contraceptive management 03/07/2014  . Depression   . Essential hypertension    related to anxiety and stress per patient  . Fibroids 07/22/2017  . Heart palpitations   . Migraine headache   . Osteoarthritis of both knees     Patient Active Problem List   Diagnosis Date Noted  . Menorrhagia with regular cycle 11/07/2017  . Fibroid uterus 10/14/2017  . Fibroids 07/22/2017  . Heart palpitations 10/25/2016  . Essential hypertension 10/25/2016  . Encounter for gynecological examination with Papanicolaou smear of cervix 07/24/2016  . Weight gain 07/24/2016  . Body mass index 37.0-37.9, adult 07/24/2016  . Weight loss counseling, encounter for 07/24/2016  . Encounter for sterilization 07/24/2016  . Pregnancy examination or test, negative result 05/29/2016  . Irregular intermenstrual bleeding 05/29/2016  . Contraceptive management 03/07/2014    Past Surgical History:  Procedure Laterality Date  . HYSTEROSCOPY N/A 11/07/2017   Procedure: HYSTEROSCOPY WITH HYDROTHERMAL  ABLATION;  Surgeon: Jonnie Kind, MD;  Location: [facility];  Service: Gynecology;  Laterality: N/A;  . LAPAROSCOPIC BILATERAL SALPINGECTOMY Bilateral 11/07/2017   Procedure: LAPAROSCOPIC BILATERAL SALPINGECTOMY;  Surgeon: Jonnie Kind, MD;  Location: [facility];  Service: Gynecology;  Laterality: Bilateral;  . LEEP  1995  . WISDOM TOOTH EXTRACTION      OB History    Gravida  1   Para  1   Term  1   Preterm      AB      Living  1     SAB      TAB      Ectopic      Multiple      Live Births  1            Home Medications    Prior to Admission medications   Medication Sig Start Date End Date Taking? Authorizing Provider  amoxicillin-clavulanate (AUGMENTIN) 875-125 MG tablet Take 1 tablet by mouth every 12 (twelve) hours. 07/17/19   Salif Tay, Darrelyn Hillock, FNP  cetirizine-pseudoephedrine (ZYRTEC-D) 5-120 MG tablet Take 1 tablet by mouth daily.    [provider]  fluticasone (FLONASE) 50 MCG/ACT nasal spray Place 1 spray into both nostrils daily for 14 days. 07/17/19 07/31/19  Branko Steeves, Darrelyn Hillock, FNP  hydrochlorothiazide (HYDRODIURIL) 25 MG tablet Take 25 mg by mouth daily.    [provider]  ibuprofen (ADVIL,MOTRIN) 800 MG tablet TAKE ONE TABLET UP TO 3 TIMES A  DAY PRN FOR PAIN AND INFLAMMATION 11/15/17   [provider]  KLOR-CON M20 20 MEQ tablet Take 1 tablet by mouth daily. 10/30/18   [provider]  LINZESS 145 MCG CAPS capsule Take 1 capsule by mouth daily as needed. 07/11/18   [provider]  metaxalone (SKELAXIN) 800 MG tablet Take 1 tablet (800 mg total) by mouth 3 (three) times daily. 03/01/19   Lorin Picket, PA-C  metoprolol succinate (TOPROL-XL) 50 MG 24 hr tablet Take 1 tablet (50 mg total) by mouth at bedtime. 12/16/18   Leonie Man, MD  naproxen (NAPROSYN) 500 MG tablet Take 1 tablet (500 mg total) by mouth 2 (two) times daily. 03/01/19   Lorin Picket, PA-C  PROAIR HFA 108 [phone] Base) MCG/ACT inhaler Inhale 2  puffs into the lungs every 4 (four) hours as needed for wheezing or shortness of breath.  09/18/16   [provider]  SUMAtriptan (IMITREX) 100 MG tablet Take 100 mg by mouth every 2 (two) hours as needed for migraine.  08/23/16   [provider]  temazepam (RESTORIL) 30 MG capsule Take 30 mg by mouth at bedtime as needed for sleep.  10/04/16   [provider]    Family History Family History  Problem Relation Age of Onset  . Healthy Mother   . Healthy Father   . Healthy Brother   . Cancer Maternal Grandfather        lung  . Breast cancer Neg Hx     Social History Social History   Tobacco Use  . Smoking status: Never Smoker  . Smokeless tobacco: Never Used  Substance Use Topics  . Alcohol use: Yes    Comment: occ beer or wine  . Drug use: No     Allergies   Patient has no known allergies.   Review of Systems Review of Systems  Constitutional: Negative.   HENT: Positive for dental problem and ear pain.   Respiratory: Negative.   Cardiovascular: Negative.   Neurological: Positive for headaches.     Physical Exam Triage Vital Signs ED Triage Vitals  Enc Vitals Group     BP 07/17/19 1413 (!) 144/90     Pulse Rate 07/17/19 1413 87     Resp 07/17/19 1413 16     Temp 07/17/19 1413 98.2 F (36.8 C)     Temp Source 07/17/19 1413 Oral     SpO2 07/17/19 1413 98 %     Weight --      Height --      Head Circumference --      Peak Flow --      Pain Score 07/17/19 1419 5     Pain Loc --      Pain Edu? --      Excl. in [place]? --    No data found.  Updated Vital Signs BP (!) 144/90 (BP Location: Right Arm)   Pulse 87   Temp 98.2 F (36.8 C) (Oral)   Resp 16   SpO2 98%   Visual Acuity Right Eye Distance:   Left Eye Distance:   Bilateral Distance:    Right Eye Near:   Left Eye Near:    Bilateral Near:     Physical Exam Vitals and nursing note reviewed.  Constitutional:      General: She is not in acute distress.    Appearance:  Normal appearance. She is well-developed and normal weight. She is not ill-appearing or toxic-appearing.  HENT:  Right Ear: Tympanic membrane, ear canal and external ear normal. There is no impacted cerumen.     Left Ear: Tympanic membrane, ear canal and external ear normal. There is no impacted cerumen.     Mouth/Throat:     Mouth: Mucous membranes are moist.     Dentition: Abnormal dentition. Dental tenderness, dental caries and dental abscesses present.     Pharynx: Oropharynx is clear. No oropharyngeal exudate or posterior oropharyngeal erythema.   Cardiovascular:     Rate and Rhythm: Normal rate and regular rhythm.     Pulses: Normal pulses.     Heart sounds: Normal heart sounds.  Pulmonary:     Effort: Pulmonary effort is normal. No respiratory distress.     Breath sounds: Normal breath sounds. No stridor.  Neurological:     Mental Status: She is alert and oriented to person, place, and time.      UC Treatments / Results  Labs (all labs ordered are listed, but only abnormal results are displayed) Labs Reviewed - No data to display  EKG   Radiology No results found.  Procedures Procedures (including critical care time)  Medications Ordered in UC Medications - No data to display  Initial Impression / Assessment and Plan / UC Course  I have reviewed the triage vital signs and the nursing notes.  Pertinent labs & imaging results that were available during my care of the patient were reviewed by me and considered in my medical decision making (see chart for details).    Patient stable for discharge.  Benign physical exam.  Will prescribe Augmentin, and ibuprofen.  Advised patient to follow-up with a dentist as soon as possible.  To return for worsening of symptoms.  Patient verbalized understanding of the plan of care.  Final Clinical Impressions(s) / UC Diagnoses   Final diagnoses:  Dental abscess  Dental caries     Discharge Instructions     Augmentin  and ibuprofen prescribed.  Use as directed for pain relief Recommend soft diet until evaluated by dentist Maintain oral hygiene care Follow up with dentist as soon as possible for further evaluation and treatment  Return or go to the ED if you have any new or worsening symptoms such as fever, chills, difficulty swallowing, painful swallowing, oral or neck swelling, nausea, vomiting, chest pain, SOB, etc...    ED Prescriptions    Medication Sig Dispense Auth. Provider   amoxicillin-clavulanate (AUGMENTIN) 875-125 MG tablet Take 1 tablet by mouth every 12 (twelve) hours. 14 tablet Aswad Wandrey S, FNP   fluticasone (FLONASE) 50 MCG/ACT nasal spray Place 1 spray into both nostrils daily for 14 days. 16 g Emerson Monte, FNP     PDMP not reviewed this encounter.   Emerson Monte, [place] 07/17/19 [phone]

## 2019-07-17 NOTE — ED Triage Notes (Addendum)
Pt presents to UC w/ c/o left side neck and ear pain x 3 days, left ear reddness. Headache. Pt believes it may be more sinus related since she has a hx of sinus infections.

## 2019-07-17 NOTE — Discharge Instructions (Signed)
Augmentin and ibuprofen prescribed.  Use as directed for pain relief Recommend soft diet until evaluated by dentist Maintain oral hygiene care Follow up with dentist as soon as possible for further evaluation and treatment  Return or go to the ED if you have any new or worsening symptoms such as fever, chills, difficulty swallowing, painful swallowing, oral or neck swelling, nausea, vomiting, chest pain, SOB, etc..Marland Kitchen

## 2019-09-01 ENCOUNTER — Other Ambulatory Visit: Payer: Self-pay

## 2019-10-19 ENCOUNTER — Other Ambulatory Visit: Payer: Self-pay | Admitting: Cardiology

## 2019-10-19 MED ORDER — METOPROLOL SUCCINATE ER 50 MG PO TB24: 50.0000 mg | ORAL_TABLET | Freq: Every day | ORAL | 3 refills | Status: DC

## 2019-10-19 NOTE — Telephone Encounter (Signed)
New Message:   Please call, she need to talk to you about how she needs her Metoprolol prescription written.

## 2019-10-19 NOTE — Telephone Encounter (Signed)
Spoke to patient . Medication was sent by primary  - incorrectly  Patient did not inform primary that  metoprolol tartrate was changed to metoprolol succinate    sent prescription for metoprolol succinate 50 mg  bedtime into the pharmacy .  Patient aware.

## 2019-12-16 ENCOUNTER — Encounter: Payer: Self-pay | Admitting: Cardiology

## 2019-12-16 ENCOUNTER — Other Ambulatory Visit: Payer: Self-pay

## 2019-12-16 ENCOUNTER — Ambulatory Visit: Payer: 59 | Admitting: Cardiology

## 2019-12-16 VITALS — BP 122/82 | HR 92 | Ht 66.0 in | Wt 248.0 lb

## 2019-12-16 DIAGNOSIS — I10 Essential (primary) hypertension: Secondary | ICD-10-CM

## 2019-12-16 DIAGNOSIS — R002 Palpitations: Secondary | ICD-10-CM | POA: Diagnosis not present

## 2019-12-16 DIAGNOSIS — Z6837 Body mass index (BMI) 37.0-37.9, adult: Secondary | ICD-10-CM | POA: Diagnosis not present

## 2019-12-16 NOTE — Progress Notes (Signed)
Primary Care Provider: Lemmie Evens, MD Cardiologist: Glenetta Hew, MD Electrophysiologist: None  Clinic Note: Chief Complaint  Patient presents with   Follow-up    Annual   Palpitations    Well-controlled     HPI:    Natalie Cordova is a 44 y.o. female with a PMH notable for hypertension and palpitations who presents today for annual follow-up.  [name] was last seen on December 16, 2018 via telemedicine visit.  She is doing well.  Palpitations were controlled unless she misses a dose of metoprolol.  Somehow metoprolol succinate 25 mg tartrate twice daily as opposed to succinate 50 mg daily.  Preferred long-acting. --> CONVERTED TO METOPROLOL SUCCINATE 50 MG DAILY  Recent Hospitalizations:   July 17, 2019-dental abscess.  Reviewed  CV studies:    The following studies were reviewed today: (if available, images/films reviewed: From Epic Chart or Care Everywhere)  N/A:  Interval History:   LYLAH Cordova, this is doing much better since we converted her Toprol from the twice daily Lopressor.  Her palpitations have been totally controlled.  Biggest issue for her over the last years it back in August she had a fall and had a significant injury to her back.  She has been on light duty and has not been back to work.  She is now hoping to get back into work with restrictions, but is still dealing with rehab and seeing if he she can figure out a way to do her work with limitations.  She is now finally at least 3 minutes a day on the treadmill somewhat limited because of her back.  She is not noticing any untoward cardiac symptoms.  No dyspnea besides expected exertional dyspnea. CV Review of Symptoms (Summary) Cardiovascular ROS: no chest pain or dyspnea on exertion positive for - Deconditioning related to back injury. negative for - chest pain, edema, irregular heartbeat, orthopnea, palpitations, paroxysmal nocturnal dyspnea, rapid heart rate, shortness of  breath or Syncope/near syncope particularly since amaurosis fugax, claudication  The patient does not have symptoms concerning for COVID-19 infection (fever, chills, cough, or new shortness of breath).  The patient is practicing social distancing & Masking.  Immunization History  Administered Date(s) Administered   PFIZER SARS-COV-2 Vaccination 10/08/2019, 10/29/2019    REVIEWED OF SYSTEMS   Review of Systems  Constitutional: Negative for malaise/fatigue and weight loss (Mainly because of back injury and lack of exercise.).  HENT: Negative for congestion and nosebleeds.   Respiratory: Negative for shortness of breath.   Cardiovascular: Negative for claudication and leg swelling.  Gastrointestinal: Negative for blood in stool, constipation and melena.  Genitourinary: Negative for hematuria.  Musculoskeletal: Positive for back pain. Negative for joint pain.  Neurological: Negative for dizziness, focal weakness and weakness.  Psychiatric/Behavioral: Negative.     I have reviewed and (if needed) personally updated the patient's problem list, medications, allergies, past medical and surgical history, social and family history.   PAST MEDICAL HISTORY   Past Medical History:  Diagnosis Date   Anxiety    Asthma    Contraceptive management 03/07/2014   Depression    Essential hypertension    related to anxiety and stress per patient   Fibroids 07/22/2017   Heart palpitations    Migraine headache    Osteoarthritis of both knees     PAST SURGICAL HISTORY   Past Surgical History:  Procedure Laterality Date   HYSTEROSCOPY N/A 11/07/2017   Procedure: HYSTEROSCOPY WITH HYDROTHERMAL ABLATION;  Surgeon: Glo Herring,  Angelyn Punt, MD;  Location: [facility];  Service: Gynecology;  Laterality: N/A;   LAPAROSCOPIC BILATERAL SALPINGECTOMY Bilateral 11/07/2017   Procedure: LAPAROSCOPIC BILATERAL SALPINGECTOMY;  Surgeon: Jonnie Kind, MD;  Location: [facility];  Service: Gynecology;  Laterality:  Bilateral;   LEEP  1995   WISDOM TOOTH EXTRACTION      MEDICATIONS/ALLERGIES   Current Meds  Medication Sig   cetirizine-pseudoephedrine (ZYRTEC-D) 5-120 MG tablet Take 1 tablet by mouth daily.   fluticasone (FLONASE) 50 MCG/ACT nasal spray Place 1 spray into both nostrils daily for 14 days.   hydrochlorothiazide (HYDRODIURIL) 25 MG tablet Take 25 mg by mouth daily.   ibuprofen (ADVIL) 800 MG tablet Take 1 tablet (800 mg total) by mouth 3 (three) times daily.   KLOR-CON M20 20 MEQ tablet Take 1 tablet by mouth daily.   LINZESS 145 MCG CAPS capsule Take 1 capsule by mouth daily as needed.   metoprolol succinate (TOPROL-XL) 50 MG 24 hr tablet Take 1 tablet (50 mg total) by mouth at bedtime.   PROAIR HFA 108 (90 Base) MCG/ACT inhaler Inhale 2 puffs into the lungs every 4 (four) hours as needed for wheezing or shortness of breath.    SUMAtriptan (IMITREX) 100 MG tablet Take 100 mg by mouth every 2 (two) hours as needed for migraine.    temazepam (RESTORIL) 30 MG capsule Take 30 mg by mouth at bedtime as needed for sleep.     No Known Allergies  SOCIAL HISTORY/FAMILY HISTORY   Reviewed in Epic:  Pertinent findings: N/A  OBJCTIVE -PE, EKG, labs    Wt Readings from Last 3 Encounters:  12/16/19 248 lb (112.5 kg)  03/01/19 238 lb (108 kg)  12/16/18 240 lb (108.9 kg)    Physical Exam: BP 122/82    Pulse 92    Ht 5\' 6"  (1.676 m)    Wt 248 lb (112.5 kg)    BMI 40.03 kg/m  Physical Exam  Constitutional: She is oriented to person, place, and time. She appears well-developed. No distress.  HENT:  Head: Normocephalic and atraumatic.  Neck: No hepatojugular reflux and no JVD present. Carotid bruit is not present.  Cardiovascular: Normal rate, regular rhythm, normal heart sounds and normal pulses.  No extrasystoles are present. PMI is not displaced. Exam reveals no gallop and no friction rub.  No murmur heard. Pulmonary/Chest: Effort normal and breath sounds normal. No  respiratory distress. She has no wheezes. She has no rales.  Musculoskeletal:        General: No edema. Normal range of motion.     Cervical back: Normal range of motion and neck supple.  Neurological: She is alert and oriented to person, place, and time.  Psychiatric: She has a normal mood and affect. Her behavior is normal. Judgment and thought content normal.  Vitals reviewed.    Adult ECG Report Not checked  Recent Labs: Not available No results found for: CHOL, HDL, LDLCALC, LDLDIRECT, TRIG, CHOLHDL Lab Results  Component Value Date   CREATININE 0.69 11/03/2017   BUN 11 11/03/2017   NA 140 11/03/2017   K 3.7 11/03/2017   CL 109 11/03/2017   CO2 22 11/03/2017   Lab Results  Component Value Date   TSH 1.290 07/04/2017    ASSESSMENT/PLAN    Problem List Items Addressed This Visit    Heart palpitations - Primary (Chronic)    Much better controlled she is on long-acting Toprol.  Would not convert from Toprol back to Lopressor.  Ensure that she  is taking metoprolol succinate.      Relevant Orders   EKG 12-Lead   Essential hypertension (Chronic)    BP well-controlled.  No issues.  Continue current medications.      Body mass index 37.0-37.9, adult    The patient understands the need to lose weight with diet and exercise. We have discussed specific strategies for this.          COVID-19 Education: The signs and symptoms of COVID-19 were discussed with the patient and how to seek care for testing (follow up with PCP or arrange E-visit).   The importance of social distancing and COVID-19 vaccination was discussed today.  I spent a total of 18 minutes with the patient. >  50% of the time was spent in direct patient consultation.  Additional time spent with chart review  / charting (studies, outside notes, etc): 6 Total Time: 24 min   Current medicines are reviewed at length with the patient today.  (+/- concerns) none  Notice: This dictation was prepared with  Dragon dictation along with smaller phrase technology. Any transcriptional errors that result from this process are unintentional and may not be corrected upon review.  Patient Instructions / Medication Changes & Studies & Tests Ordered   Patient Instructions  Medication Instructions:  No changes *If you need a refill on your cardiac medications before your next appointment, please call your pharmacy*   Lab Work: Not  needed    Testing/Procedures: No needed   Follow-Up: At [facility], you and your health needs are our priority.  As part of our continuing mission to provide you with exceptional heart care, we have created designated Provider Care Teams.  These Care Teams include your primary Cardiologist (physician) and Advanced Practice Providers (APPs -  Physician Assistants and Nurse Practitioners) who all work together to provide you with the care you need, when you need it.     Your next appointment:   1 year(s)  The format for your next appointment:   In Person  Provider:   Glenetta Hew, MD   Other Instructions     Studies Ordered:   Orders Placed This Encounter  Procedures   EKG 12-Lead     Glenetta Hew, M.D., M.S. Interventional Cardiologist   Pager # [phone] Phone # [phone] [address] [place], [place] [address]   Thank you for choosing Heartcare at [facility]!!

## 2019-12-16 NOTE — Patient Instructions (Signed)
Medication Instructions:  No changes *If you need a refill on your cardiac medications before your next appointment, please call your pharmacy*   Lab Work: Not  needed    Testing/Procedures: No needed   Follow-Up: At [facility], you and your health needs are our priority.  As part of our continuing mission to provide you with exceptional heart care, we have created designated Provider Care Teams.  These Care Teams include your primary Cardiologist (physician) and Advanced Practice Providers (APPs -  Physician Assistants and Nurse Practitioners) who all work together to provide you with the care you need, when you need it.     Your next appointment:   1 year(s)  The format for your next appointment:   In Person  Provider:   Glenetta Hew, MD   Other Instructions

## 2019-12-19 ENCOUNTER — Encounter: Payer: Self-pay | Admitting: Cardiology

## 2019-12-19 NOTE — Assessment & Plan Note (Signed)
The patient understands the need to lose weight with diet and exercise. We have discussed specific strategies for this.  

## 2019-12-19 NOTE — Assessment & Plan Note (Signed)
BP well-controlled.  No issues.  Continue current medications.

## 2019-12-19 NOTE — Assessment & Plan Note (Signed)
Much better controlled she is on long-acting Toprol.  Would not convert from Toprol back to Lopressor.  Ensure that she is taking metoprolol succinate.

## 2020-05-10 ENCOUNTER — Other Ambulatory Visit: Payer: Self-pay | Admitting: Family Medicine

## 2020-05-10 DIAGNOSIS — Z1231 Encounter for screening mammogram for malignant neoplasm of breast: Secondary | ICD-10-CM

## 2020-06-15 ENCOUNTER — Ambulatory Visit: Payer: 59 | Admitting: Adult Health

## 2020-06-15 ENCOUNTER — Other Ambulatory Visit: Payer: Self-pay

## 2020-06-15 ENCOUNTER — Other Ambulatory Visit (HOSPITAL_COMMUNITY)
Admission: RE | Admit: 2020-06-15 | Discharge: 2020-06-15 | Disposition: A | Discharge status: Home / Self Care | Payer: 59 | Source: Ambulatory Visit | Attending: Adult Health | Admitting: Adult Health

## 2020-06-15 ENCOUNTER — Encounter: Payer: Self-pay | Admitting: Adult Health

## 2020-06-15 VITALS — BP 133/87 | HR 90 | Ht 65.5 in | Wt 232.0 lb

## 2020-06-15 DIAGNOSIS — N898 Other specified noninflammatory disorders of vagina: Secondary | ICD-10-CM | POA: Insufficient documentation

## 2020-06-15 DIAGNOSIS — B9689 Other specified bacterial agents as the cause of diseases classified elsewhere: Secondary | ICD-10-CM | POA: Insufficient documentation

## 2020-06-15 DIAGNOSIS — N760 Acute vaginitis: Secondary | ICD-10-CM | POA: Diagnosis not present

## 2020-06-15 DIAGNOSIS — B373 Candidiasis of vulva and vagina: Secondary | ICD-10-CM | POA: Diagnosis not present

## 2020-06-15 DIAGNOSIS — Z113 Encounter for screening for infections with a predominantly sexual mode of transmission: Secondary | ICD-10-CM | POA: Insufficient documentation

## 2020-06-15 MED ORDER — METRONIDAZOLE 0.75 % VA GEL: 1.0000 | Freq: Every day | VAGINAL | 0 refills | Status: DC

## 2020-06-15 NOTE — Progress Notes (Signed)
°  Subjective:     Patient ID: Natalie Cordova, female   DOB: 25-Sep-1975, 44 y.o.   MRN: [id number]  HPI Natalie Cordova is a 44 year old black female, married, G1P1 in complaining of vaginal odor esp after sex and period and vaginal irritation. PCP is Dr Karie Kirks  Review of Systems Has vaginal odor esp after period or sex Has vaginal irritation Still has periods, had ablation Reviewed past medical,surgical, social and family history. Reviewed medications and allergies.     Objective:   Physical Exam BP 133/87 (BP Location: Left Arm, Patient Position: Sitting, Cuff Size: Large)    Pulse 90    Ht 5' 5.5" (1.664 m)    Wt 232 lb (105.2 kg)    LMP 06/05/2020    BMI 38.02 kg/m  Skin warm and dry.Pelvic: external genitalia is normal in appearance no lesions, vagina: white discharge without odor,urethra has no lesions or masses noted, cervix:smooth and bulbous, uterus: normal size, shape and contour, non tender, no masses felt, adnexa: no masses or tenderness noted. Bladder is non tender and no masses felt. CV swab obtained.  Fall risk is low  Upstream - 06/15/20 1521      Pregnancy Intention Screening   Does the patient want to become pregnant in the next year? No    Does the patient's partner want to become pregnant in the next year? No    Would the patient like to discuss contraceptive options today? No      Contraception Wrap Up   Current Method Female Sterilization    End Method Female Sterilization    Contraception Counseling Provided No         Examination chaperoned by Diona Fanti CMA.    Assessment:     1. Vaginal odor Will rx Metrogel Meds ordered this encounter  Medications   metroNIDAZOLE (METROGEL) 0.75 % vaginal gel    Sig: Place 1 Applicatorful vaginally at bedtime.    Dispense:  70 g    Refill:  0    Order Specific Question:   Supervising Provider    Answer:   Elonda Husky, LUTHER H [[id number]]   CV swab sent  2. Vaginal discharge CV swab sent  3. Vaginal irritation CV  swab sent    Plan:     Follow up prn

## 2020-06-19 LAB — CERVICOVAGINAL ANCILLARY ONLY
Bacterial Vaginitis (gardnerella): POSITIVE — AB
Candida Glabrata: NEGATIVE
Candida Vaginitis: POSITIVE — AB
Chlamydia: NEGATIVE
Comment: NEGATIVE
Comment: NEGATIVE
Comment: NEGATIVE
Comment: NEGATIVE
Comment: NEGATIVE
Comment: NORMAL
Neisseria Gonorrhea: NEGATIVE
Trichomonas: NEGATIVE

## 2020-06-20 ENCOUNTER — Other Ambulatory Visit: Payer: Self-pay | Admitting: Adult Health

## 2020-06-20 MED ORDER — FLUCONAZOLE 150 MG PO TABS: ORAL_TABLET | ORAL | 1 refills | Status: DC

## 2020-06-20 NOTE — Progress Notes (Signed)
Will rx diflucan  

## 2020-06-22 ENCOUNTER — Ambulatory Visit
Admission: RE | Admit: 2020-06-22 | Discharge: 2020-06-22 | Disposition: A | Discharge status: Home / Self Care | Payer: 59 | Source: Ambulatory Visit | Attending: Family Medicine | Admitting: Family Medicine

## 2020-06-22 ENCOUNTER — Other Ambulatory Visit: Payer: Self-pay

## 2020-06-22 DIAGNOSIS — Z1231 Encounter for screening mammogram for malignant neoplasm of breast: Secondary | ICD-10-CM

## 2020-07-19 ENCOUNTER — Telehealth: Payer: Self-pay | Admitting: Adult Health

## 2020-07-19 NOTE — Telephone Encounter (Signed)
Patient wants to know if another antibiotic( or a stronger one )for yeast infection can be sent to her Pharmacy. Clinical staff will follow up with patient.

## 2020-07-19 NOTE — Telephone Encounter (Signed)
Pt feels like she has yeast. Pt took diflucan the beginning of December, 2 tabs. I spoke with Jenn. Pt was advised can try Monistat 7. Pt voiced understanding. JSY

## 2020-08-11 ENCOUNTER — Other Ambulatory Visit (INDEPENDENT_AMBULATORY_CARE_PROVIDER_SITE_OTHER): Payer: 59 | Admitting: *Deleted

## 2020-08-11 ENCOUNTER — Other Ambulatory Visit: Payer: Self-pay

## 2020-08-11 DIAGNOSIS — R319 Hematuria, unspecified: Secondary | ICD-10-CM

## 2020-08-11 DIAGNOSIS — R300 Dysuria: Secondary | ICD-10-CM

## 2020-08-11 LAB — POCT URINALYSIS DIPSTICK
Glucose, UA: NEGATIVE
Ketones, UA: NEGATIVE
Nitrite, UA: NEGATIVE
Protein, UA: POSITIVE — AB

## 2020-08-11 MED ORDER — PHENAZOPYRIDINE HCL 200 MG PO TABS: 200.0000 mg | ORAL_TABLET | Freq: Three times a day (TID) | ORAL | 0 refills | Status: DC | PRN

## 2020-08-11 MED ORDER — SULFAMETHOXAZOLE-TRIMETHOPRIM 800-160 MG PO TABS: 1.0000 | ORAL_TABLET | Freq: Two times a day (BID) | ORAL | 0 refills | Status: DC

## 2020-08-11 NOTE — Progress Notes (Signed)
   NURSE VISIT- UTI SYMPTOMS   SUBJECTIVE:  Natalie Cordova is a 45 y.o. G57P1001 female here for UTI symptoms. She is a GYN patient. She reports burning with urination and blood in urine.  OBJECTIVE:  There were no vitals taken for this visit.  Appears well, in no apparent distress  Results for orders placed or performed in visit on 08/11/20 (from the past 24 hour(s))  POCT Urinalysis Dipstick   Collection Time: 08/11/20 10:09 AM  Result Value Ref Range   Color, UA     Clarity, UA     Glucose, UA Negative Negative   Bilirubin, UA     Ketones, UA neg    Spec Grav, UA     Blood, UA 3+    pH, UA     Protein, UA Positive (A) Negative   Urobilinogen, UA     Nitrite, UA neg    Leukocytes, UA Moderate (2+) (A) Negative   Appearance     Odor      ASSESSMENT: GYN patient with UTI symptoms and negative nitrites  PLAN: Discussed with Derrek Monaco, AGNP   Rx sent by provider today: Yes Urine culture sent Call or return to clinic prn if these symptoms worsen or fail to improve as anticipated. Follow-up: as needed   Levy Pupa  08/11/2020 10:13 AM

## 2020-08-11 NOTE — Progress Notes (Signed)
Chart reviewed for nurse visit. Agree with plan of care. Will rx septra ds 1 bid x 7 days and pyridium  Estill Dooms, NP 08/11/2020 11:40 AM

## 2020-08-11 NOTE — Addendum Note (Signed)
Addended by: Derrek Monaco A on: 08/11/2020 11:41 AM   Modules accepted: Orders

## 2020-08-12 LAB — URINALYSIS, ROUTINE W REFLEX MICROSCOPIC
Bilirubin, UA: NEGATIVE
Glucose, UA: NEGATIVE
Ketones, UA: NEGATIVE
Nitrite, UA: NEGATIVE
Specific Gravity, UA: 1.011 (ref 1.005–1.030)
Urobilinogen, Ur: 0.2 mg/dL (ref 0.2–1.0)
pH, UA: 8 — ABNORMAL HIGH (ref 5.0–7.5)

## 2020-08-12 LAB — MICROSCOPIC EXAMINATION
Casts: NONE SEEN /lpf
RBC: >30 /hpf — AB (ref 0–2)
WBC, UA: >30 /hpf — AB (ref 0–5)

## 2020-08-15 LAB — URINE CULTURE

## 2020-08-16 LAB — URINE CULTURE

## 2020-08-29 ENCOUNTER — Other Ambulatory Visit (HOSPITAL_COMMUNITY): Payer: Self-pay | Admitting: Nurse Practitioner

## 2020-08-29 ENCOUNTER — Other Ambulatory Visit (HOSPITAL_COMMUNITY): Payer: Self-pay | Admitting: Otolaryngology

## 2020-08-29 DIAGNOSIS — N63 Unspecified lump in unspecified breast: Secondary | ICD-10-CM

## 2020-10-03 ENCOUNTER — Other Ambulatory Visit (HOSPITAL_COMMUNITY): Payer: Self-pay | Admitting: Nurse Practitioner

## 2020-10-03 ENCOUNTER — Ambulatory Visit (HOSPITAL_COMMUNITY)
Admission: RE | Admit: 2020-10-03 | Discharge: 2020-10-03 | Disposition: A | Discharge status: Home / Self Care | Payer: 59 | Source: Ambulatory Visit | Attending: Nurse Practitioner | Admitting: Nurse Practitioner

## 2020-10-03 ENCOUNTER — Other Ambulatory Visit: Payer: Self-pay

## 2020-10-03 DIAGNOSIS — N63 Unspecified lump in unspecified breast: Secondary | ICD-10-CM

## 2020-11-24 ENCOUNTER — Other Ambulatory Visit: Payer: Self-pay

## 2020-11-24 ENCOUNTER — Ambulatory Visit: Payer: 59 | Admitting: Obstetrics & Gynecology

## 2020-11-24 ENCOUNTER — Encounter: Payer: Self-pay | Admitting: Obstetrics & Gynecology

## 2020-11-24 VITALS — BP 126/80 | HR 69 | Ht 65.5 in | Wt 230.0 lb

## 2020-11-24 DIAGNOSIS — N946 Dysmenorrhea, unspecified: Secondary | ICD-10-CM | POA: Diagnosis not present

## 2020-11-24 DIAGNOSIS — D250 Submucous leiomyoma of uterus: Secondary | ICD-10-CM

## 2020-11-24 MED ORDER — MEGESTROL ACETATE 20 MG PO TABS: 20.0000 mg | ORAL_TABLET | Freq: Every day | ORAL | 3 refills | Status: DC

## 2020-11-24 NOTE — Progress Notes (Signed)
Chief Complaint  Patient presents with  . discuss getting a hyst      45 y.o. G1P1001 Patient's last menstrual period was 11/13/2020. The current method of family planning is tubal ligation.  Outpatient Encounter Medications as of 11/24/2020  Medication Sig  . cetirizine-pseudoephedrine (ZYRTEC-D) 5-120 MG tablet Take 1 tablet by mouth as needed.  . hydrochlorothiazide (HYDRODIURIL) 25 MG tablet Take 25 mg by mouth daily.  Marland Kitchen ibuprofen (ADVIL) 800 MG tablet Take 1 tablet (800 mg total) by mouth 3 (three) times daily. (Patient taking differently: Take 800 mg by mouth as needed.)  . KLOR-CON M20 20 MEQ tablet Take 1 tablet by mouth daily.  Marland Kitchen LINZESS 145 MCG CAPS capsule Take 1 capsule by mouth daily as needed.  . megestrol (MEGACE) 20 MG tablet Take 1 tablet (20 mg total) by mouth daily.  . metoprolol succinate (TOPROL-XL) 50 MG 24 hr tablet Take 1 tablet (50 mg total) by mouth at bedtime. (Patient taking differently: Take 50 mg by mouth as needed.)  . PROAIR HFA 108 (90 Base) MCG/ACT inhaler Inhale 2 puffs into the lungs every 4 (four) hours as needed for wheezing or shortness of breath.   . SUMAtriptan (IMITREX) 100 MG tablet Take 100 mg by mouth every 2 (two) hours as needed for migraine.   . temazepam (RESTORIL) 30 MG capsule Take 30 mg by mouth at bedtime as needed for sleep.   . fluticasone (FLONASE) 50 MCG/ACT nasal spray Place 1 spray into both nostrils daily for 14 days.  . [DISCONTINUED] phenazopyridine (PYRIDIUM) 200 MG tablet Take 1 tablet (200 mg total) by mouth 3 (three) times daily as needed for pain.  . [DISCONTINUED] sulfamethoxazole-trimethoprim (BACTRIM DS) 800-160 MG tablet Take 1 tablet by mouth 2 (two) times daily. Take 1 bid   No facility-administered encounter medications on file as of 11/24/2020.    Subjective Pt was doing well post ablation/tubal until 3-4 months ago Her periods were less painful but the bleeding was still pretty significant Then about a  year ago the periods started getting lighter Pain was not an issue Then about 3-4 months ago with continued improved bleeding her pain cramps began worsening Starts 3-4 days prior to menstruation and while bleeding When stops bleeding her pain resolves No pain with intercourse Bleeding red then tapers to dark brown  Past Medical History:  Diagnosis Date  . Anxiety   . Asthma   . Contraceptive management 03/07/2014  . Depression   . Essential hypertension    related to anxiety and stress per patient  . Fibroids 07/22/2017  . Heart palpitations   . Migraine headache   . Osteoarthritis of both knees     Past Surgical History:  Procedure Laterality Date  . HYSTEROSCOPY N/A 11/07/2017   Procedure: HYSTEROSCOPY WITH HYDROTHERMAL ABLATION;  Surgeon: Jonnie Kind, MD;  Location: [facility];  Service: Gynecology;  Laterality: N/A;  . LAPAROSCOPIC BILATERAL SALPINGECTOMY Bilateral 11/07/2017   Procedure: LAPAROSCOPIC BILATERAL SALPINGECTOMY;  Surgeon: Jonnie Kind, MD;  Location: [facility];  Service: Gynecology;  Laterality: Bilateral;  . LEEP  1995  . WISDOM TOOTH EXTRACTION      OB History    Gravida  1   Para  1   Term  1   Preterm      AB      Living  1     SAB      IAB      Ectopic  Multiple      Live Births  1           No Known Allergies  Social History   Socioeconomic History  . Marital status: Married    Spouse name: Not on file  . Number of children: 1  . Years of education: 67  . Highest education level: Not on file  Occupational History  . Not on file  Tobacco Use  . Smoking status: Never Smoker  . Smokeless tobacco: Never Used  Vaping Use  . Vaping Use: Never used  Substance and Sexual Activity  . Alcohol use: Yes    Comment: occ beer or wine  . Drug use: No  . Sexual activity: Yes    Birth control/protection: Surgical    Comment: tubal and ablation  Other Topics Concern  . Not on file  Social History Narrative   She has been  married for 3-1/2 years. She lives with her husband and daughter (8 year old). She has a high school education, but is not currently working. She does not exercise because of lack of motivation. She does not drink alcohol or smoke.   Social Determinants of Health   Financial Resource Strain: Not on file  Food Insecurity: Not on file  Transportation Needs: Not on file  Physical Activity: Not on file  Stress: Not on file  Social Connections: Not on file    Family History  Problem Relation Age of Onset  . Healthy Mother   . Healthy Father   . Healthy Brother   . Cancer Maternal Grandfather        lung  . Breast cancer Maternal Aunt     Medications:       Current Outpatient Medications:  .  cetirizine-pseudoephedrine (ZYRTEC-D) 5-120 MG tablet, Take 1 tablet by mouth as needed., Disp: , Rfl:  .  hydrochlorothiazide (HYDRODIURIL) 25 MG tablet, Take 25 mg by mouth daily., Disp: , Rfl:  .  ibuprofen (ADVIL) 800 MG tablet, Take 1 tablet (800 mg total) by mouth 3 (three) times daily. (Patient taking differently: Take 800 mg by mouth as needed.), Disp: 21 tablet, Rfl: 0 .  KLOR-CON M20 20 MEQ tablet, Take 1 tablet by mouth daily., Disp: , Rfl:  .  LINZESS 145 MCG CAPS capsule, Take 1 capsule by mouth daily as needed., Disp: , Rfl:  .  megestrol (MEGACE) 20 MG tablet, Take 1 tablet (20 mg total) by mouth daily., Disp: 30 tablet, Rfl: 3 .  metoprolol succinate (TOPROL-XL) 50 MG 24 hr tablet, Take 1 tablet (50 mg total) by mouth at bedtime. (Patient taking differently: Take 50 mg by mouth as needed.), Disp: 90 tablet, Rfl: 3 .  PROAIR HFA 108 (90 Base) MCG/ACT inhaler, Inhale 2 puffs into the lungs every 4 (four) hours as needed for wheezing or shortness of breath. , Disp: , Rfl:  .  SUMAtriptan (IMITREX) 100 MG tablet, Take 100 mg by mouth every 2 (two) hours as needed for migraine. , Disp: , Rfl:  .  temazepam (RESTORIL) 30 MG capsule, Take 30 mg by mouth at bedtime as needed for sleep. ,  Disp: , Rfl: 5 .  fluticasone (FLONASE) 50 MCG/ACT nasal spray, Place 1 spray into both nostrils daily for 14 days., Disp: 16 g, Rfl: 0  Objective Blood pressure 126/80, pulse 69, height 5' 5.5" (1.664 m), weight 230 lb (104.3 kg), last menstrual period 11/13/2020.  Gen WDWN NAD  Pertinent ROS No burning with urination, frequency or urgency No nausea, vomiting  or diarrhea Nor fever chills or other constitutional symptoms   Labs or studies No new    Impression Diagnoses this Encounter::   ICD-10-CM   1. Dysmenorrhea: new acute  N94.6 US PELVIS (TRANSABDOMINAL ONLY)    US PELVIS TRANSVAGINAL NON-OB (TV ONLY)  2. Submucous uterine fibroid  D25.0 US PELVIS (TRANSABDOMINAL ONLY)    US PELVIS TRANSVAGINAL NON-OB (TV ONLY)    Established relevant diagnosis(es): S/P endometrial ablation 11/07/2017  Plan/Recommendations: Meds ordered this encounter  Medications  . megestrol (MEGACE) 20 MG tablet    Sig: Take 1 tablet (20 mg total) by mouth daily.    Dispense:  30 tablet    Refill:  3    Labs or Scans Ordered: Orders Placed This Encounter  Procedures  . US PELVIS (TRANSABDOMINAL ONLY)  . US PELVIS TRANSVAGINAL NON-OB (TV ONLY)    Management:: Will trial progesterone only megestrol lower dose since bleeding is not heavy  Sonogram to evaluate the submucosal myoma and endometrial cavity, pelvic anatomy  Follow up Return in about 1 month (around 12/25/2020) for GYN sono, Follow up, with Dr Elonda Husky.      All questions were answered.

## 2020-12-28 ENCOUNTER — Ambulatory Visit (INDEPENDENT_AMBULATORY_CARE_PROVIDER_SITE_OTHER): Payer: 59

## 2020-12-28 ENCOUNTER — Ambulatory Visit: Payer: 59 | Admitting: Obstetrics & Gynecology

## 2020-12-28 ENCOUNTER — Encounter: Payer: Self-pay | Admitting: Obstetrics & Gynecology

## 2020-12-28 ENCOUNTER — Other Ambulatory Visit: Payer: Self-pay

## 2020-12-28 VITALS — BP 136/85 | HR 70 | Ht 65.5 in | Wt 230.4 lb

## 2020-12-28 DIAGNOSIS — N946 Dysmenorrhea, unspecified: Secondary | ICD-10-CM | POA: Diagnosis not present

## 2020-12-28 DIAGNOSIS — D250 Submucous leiomyoma of uterus: Secondary | ICD-10-CM

## 2020-12-28 MED ORDER — NORETHINDRONE 0.35 MG PO TABS: 1.0000 | ORAL_TABLET | Freq: Every day | ORAL | 11 refills | Status: DC

## 2020-12-28 MED ORDER — NAPROXEN SODIUM 550 MG PO TABS: 550.0000 mg | ORAL_TABLET | Freq: Two times a day (BID) | ORAL | 2 refills | Status: DC

## 2020-12-28 NOTE — Progress Notes (Signed)
PELVIC US TA/TV: enlarged heterogeneous anteverted uterus with mult fibroids,(#1) anterior submucosal vs intramural fibroid 3.8 x 3 x 3.6 cm,(#2) anterior right subserosal fibroid 2.5 x 3.1 x 2.7 cm,(#3) anterior mid intramural fibroid 1.4 x 1.3 x 1.6 cm,EEC 7.5 mm,normal left ovary,hemorrhagic right ovarian cyst 2.6 x 2.3 x 1.6 cm,ovaries appear mobile,no free fluid,no pain during ultrasound  Chaperone Peggy

## 2020-12-28 NOTE — Progress Notes (Signed)
Follow up appointment for results  Chief Complaint  Patient presents with   Follow-up    U/S today    Blood pressure 136/85, pulse 70, height 5' 5.5" (1.664 m), weight 230 lb 6.4 oz (104.5 kg).  US PELVIS TRANSVAGINAL NON-OB (TV ONLY)  Result Date: 12/28/2020 Images from the original result were not included.  Center for [facility] @ [facility] [address] [place] [address]                                                                                                  GYNECOLOGIC SONOGRAM Natalie Cordova is a 46 y.o. G1P1001 No LMP recorded. Patient has had an ablation. She is here for a pelvic sonogram for dysmenorrhea. Uterus                      9.2 x 5.8 x 7.4 cm, Total uterine volume 205 cc,enlarged heterogeneous anteverted uterus with mult fibroids Endometrium          7.5 mm, symmetrical, wnl Right ovary             3.2 x 3.2 x 3 cm, hemorrhagic right ovarian cyst 2.6 x 2.3 x 1.6 cm Left ovary                2.9 x 1.8 x 3.1 cm, wnl Fibroids (#1) anterior submucosal vs intramural fibroid 3.8 x 3 x 3.6 cm,(#2) anterior right subserosal fibroid 2.5 x 3.1 x 2.7 cm,(#3) anterior mid intramural fibroid 1.4 x 1.3 x 1.6 cm Technician Comments: PELVIC US TA/TV: enlarged heterogeneous anteverted uterus with mult fibroids,(#1) anterior submucosal vs intramural fibroid 3.8 x 3 x 3.6 cm,(#2) anterior right subserosal fibroid 2.5 x 3.1 x 2.7 cm,(#3) anterior mid intramural fibroid 1.4 x 1.3 x 1.6 cm,EEC 7.5 mm,normal left ovary,hemorrhagic right ovarian cyst 2.6 x 2.3 x 1.6 cm,ovaries appear mobile,no free fluid,no pain during ultrasound Chaperone [address] Heide Guile 12/28/2020 10:18 AM Clinical Impression and recommendations: I have reviewed the sonogram results above, combined with the patient's current clinical course, below are my impressions and any appropriate recommendations for management based on the sonographic findings. Uterus is slightly large for age and parity but slightly  smaller than in past Endometrium normal with no evidence of hematometra Both ovaries are normal size shape, right with hemorrhagic corpus luteum Florian Buff 12/28/2020 11:01 AM  US PELVIS (TRANSABDOMINAL ONLY)  Result Date: 12/28/2020 Images from the original result were not included.  Center for [facility] @ [facility] [address] [place] ,[place] [address]  GYNECOLOGIC SONOGRAM Natalie Cordova is a 45 y.o. G1P1001 No LMP recorded. Patient has had an ablation. She is here for a pelvic sonogram for dysmenorrhea. Uterus                      9.2 x 5.8 x 7.4 cm, Total uterine volume 205 cc,enlarged heterogeneous anteverted uterus with mult fibroids Endometrium          7.5 mm, symmetrical, wnl Right ovary             3.2 x 3.2 x 3 cm, hemorrhagic right ovarian cyst 2.6 x 2.3 x 1.6 cm Left ovary                2.9 x 1.8 x 3.1 cm, wnl Fibroids (#1) anterior submucosal vs intramural fibroid 3.8 x 3 x 3.6 cm,(#2) anterior right subserosal fibroid 2.5 x 3.1 x 2.7 cm,(#3) anterior mid intramural fibroid 1.4 x 1.3 x 1.6 cm Technician Comments: PELVIC US TA/TV: enlarged heterogeneous anteverted uterus with mult fibroids,(#1) anterior submucosal vs intramural fibroid 3.8 x 3 x 3.6 cm,(#2) anterior right subserosal fibroid 2.5 x 3.1 x 2.7 cm,(#3) anterior mid intramural fibroid 1.4 x 1.3 x 1.6 cm,EEC 7.5 mm,normal left ovary,hemorrhagic right ovarian cyst 2.6 x 2.3 x 1.6 cm,ovaries appear mobile,no free fluid,no pain during ultrasound Chaperone [address] Heide Guile 12/28/2020 10:18 AM Clinical Impression and recommendations: I have reviewed the sonogram results above, combined with the patient's current clinical course, below are my impressions and any appropriate recommendations for management based on the sonographic findings. Uterus is slightly large for age and parity but slightly smaller than in past Endometrium  normal with no evidence of hematometra Both ovaries are normal size shape, right with hemorrhagic corpus luteum Florian Buff 12/28/2020 11:01 AM     MEDS ordered this encounter: Meds ordered this encounter  Medications   DISCONTD: norethindrone (MICRONOR) 0.35 MG tablet    Sig: Take 1 tablet (0.35 mg total) by mouth daily.    Dispense:  28 tablet    Refill:  11   norethindrone (MICRONOR) 0.35 MG tablet    Sig: Take 1 tablet (0.35 mg total) by mouth daily.    Dispense:  28 tablet    Refill:  11    Orders for this encounter: No orders of the defined types were placed in this encounter.   Impression:   ICD-10-CM   1. Dysmenorrhea: becoming chronic  N94.6    trial of micronor for suppression of right corpus luteum and to see if pain is improved       Plan: Micronor for suppression Anaprox DS for pain management  Follow Up: No follow-ups on file.      All questions were answered.  Past Medical History:  Diagnosis Date   Anxiety    Asthma    Contraceptive management 03/07/2014   Depression    Essential hypertension    related to anxiety and stress per patient   Fibroids 07/22/2017   Heart palpitations    Migraine headache    Osteoarthritis of both knees     Past Surgical History:  Procedure Laterality Date   HYSTEROSCOPY N/A 11/07/2017   Procedure: HYSTEROSCOPY WITH HYDROTHERMAL ABLATION;  Surgeon: Jonnie Kind, MD;  Location: [facility];  Service: Gynecology;  Laterality: N/A;   LAPAROSCOPIC BILATERAL SALPINGECTOMY Bilateral 11/07/2017   Procedure: LAPAROSCOPIC BILATERAL SALPINGECTOMY;  Surgeon: Jonnie Kind, MD;  Location: [facility];  Service: Gynecology;  Laterality: Bilateral;  LEEP  1995   WISDOM TOOTH EXTRACTION      OB History     Gravida  1   Para  1   Term  1   Preterm      AB      Living  1      SAB      IAB      Ectopic      Multiple      Live Births  1           No Known Allergies  Social History   Socioeconomic  History   Marital status: Married    Spouse name: Not on file   Number of children: 1   Years of education: 12   Highest education level: Not on file  Occupational History   Not on file  Tobacco Use   Smoking status: Never   Smokeless tobacco: Never  Vaping Use   Vaping Use: Never used  Substance and Sexual Activity   Alcohol use: Yes    Comment: occ beer or wine   Drug use: No   Sexual activity: Yes    Birth control/protection: Surgical    Comment: tubal and ablation  Other Topics Concern   Not on file  Social History Narrative   She has been married for 3-1/2 years. She lives with her husband and daughter (78 year old). She has a high school education, but is not currently working. She does not exercise because of lack of motivation. She does not drink alcohol or smoke.   Social Determinants of Health   Financial Resource Strain: Not on file  Food Insecurity: Not on file  Transportation Needs: Not on file  Physical Activity: Not on file  Stress: Not on file  Social Connections: Not on file    Family History  Problem Relation Age of Onset   Healthy Mother    Healthy Father    Healthy Brother    Cancer Maternal Grandfather        lung   Breast cancer Maternal Aunt

## 2021-03-23 ENCOUNTER — Ambulatory Visit: Payer: 59 | Admitting: Cardiology

## 2021-05-14 ENCOUNTER — Other Ambulatory Visit: Payer: Self-pay | Admitting: Family Medicine

## 2021-05-14 DIAGNOSIS — Z1231 Encounter for screening mammogram for malignant neoplasm of breast: Secondary | ICD-10-CM

## 2021-05-25 ENCOUNTER — Other Ambulatory Visit: Payer: Self-pay

## 2021-05-25 ENCOUNTER — Ambulatory Visit: Payer: 59 | Admitting: Medical

## 2021-05-25 ENCOUNTER — Encounter: Payer: Self-pay | Admitting: Medical

## 2021-05-25 VITALS — BP 116/72 | HR 70 | Ht 65.0 in | Wt 242.0 lb

## 2021-05-25 DIAGNOSIS — Z6841 Body Mass Index (BMI) 40.0 and over, adult: Secondary | ICD-10-CM

## 2021-05-25 DIAGNOSIS — R002 Palpitations: Secondary | ICD-10-CM | POA: Diagnosis not present

## 2021-05-25 DIAGNOSIS — I10 Essential (primary) hypertension: Secondary | ICD-10-CM

## 2021-05-25 NOTE — Progress Notes (Signed)
Cardiology Office Note   Date:  05/25/2021   ID:  Natalie Cordova, DOB 1976/05/27, MRN [id number]  PCP:  Lemmie Evens, MD  Cardiologist:  Glenetta Hew, MD EP: None  Chief Complaint  Patient presents with   Follow-up    Palpitations hypertension       History of Present Illness: Natalie Cordova is a 45 y.o. female a PMH of HTN, palpitations, and obesity who presents for annual follow-up.  She was last evaluated by cardiology and an outpatient visit with Dr. Ellyn Hack 12/2019 for follow-up of her hypertension and palpitations.  She reported good control of her palpitations on metoprolol succinate.  Her primary complaint at that time was back pain after a fall almost a year prior.  No medication changes occurred and she was recommended to follow-up in 1 year.  Previous cardiac work-up included Holter monitor which revealed rare PACs/PVCs but no significant arrhythmias.  She presents today for annual follow-up.  She has been doing pretty well over the past year.  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  She reports good control of her palpitations on metoprolol.  Blood pressures have been well controlled.  She denies trouble with chest pain, shortness of breath, lower extremity edema, orthopnea, PND, dizziness, lightheadedness, syncope.   Past Medical History:  Diagnosis Date   Anxiety    Asthma    Contraceptive management 03/07/2014   Depression    Essential hypertension    related to anxiety and stress per patient   Fibroids 07/22/2017   Heart palpitations    Migraine headache    Osteoarthritis of both knees     Past Surgical History:  Procedure Laterality Date   HYSTEROSCOPY N/A 11/07/2017   Procedure: HYSTEROSCOPY WITH HYDROTHERMAL ABLATION;  Surgeon: Jonnie Kind, MD;  Location: [facility];  Service: Gynecology;  Laterality: N/A;   LAPAROSCOPIC BILATERAL SALPINGECTOMY Bilateral 11/07/2017   Procedure: LAPAROSCOPIC BILATERAL  SALPINGECTOMY;  Surgeon: Jonnie Kind, MD;  Location: [facility];  Service: Gynecology;  Laterality: Bilateral;   LEEP  1995   WISDOM TOOTH EXTRACTION       Current Outpatient Medications  Medication Sig Dispense Refill   cetirizine-pseudoephedrine (ZYRTEC-D) 5-120 MG tablet Take 1 tablet by mouth as needed.     clonazePAM (KLONOPIN) 1 MG tablet SMARTSIG:1 Tablet(s) By Mouth 1-2 Times Daily     fluticasone (FLONASE) 50 MCG/ACT nasal spray Place 1 spray into both nostrils daily for 14 days. 16 g 0   hydrochlorothiazide (HYDRODIURIL) 25 MG tablet Take 25 mg by mouth daily.     ibuprofen (ADVIL) 800 MG tablet Take 1 tablet (800 mg total) by mouth 3 (three) times daily. (Patient taking differently: Take 800 mg by mouth as needed.) 21 tablet 0   KLOR-CON M20 20 MEQ tablet Take 1 tablet by mouth daily.     LINZESS 145 MCG CAPS capsule Take 1 capsule by mouth daily as needed.     metoprolol succinate (TOPROL-XL) 50 MG 24 hr tablet Take 1 tablet (50 mg total) by mouth at bedtime. (Patient taking differently: Take 50 mg by mouth as needed.) 90 tablet 3   PROAIR HFA 108 (90 Base) MCG/ACT inhaler Inhale 2 puffs into the lungs every 4 (four) hours as needed for wheezing or shortness of breath.      SUMAtriptan (IMITREX) 100 MG tablet Take 100 mg by mouth every 2 (two) hours as needed for migraine.     temazepam (  RESTORIL) 30 MG capsule Take 30 mg by mouth at bedtime as needed for sleep.  5   No current facility-administered medications for this visit.    Allergies:   Patient has no known allergies.    Social History:  The patient  reports that she has never smoked. She has never used smokeless tobacco. She reports current alcohol use. She reports that she does not use drugs.   Family History:  The patient's family history includes Breast cancer in her maternal aunt; Cancer in her maternal grandfather; Healthy in her brother, father, and mother.    ROS:  Please see the history of present illness.    Otherwise, review of systems are positive for none.   All other systems are reviewed and negative.    PHYSICAL EXAM: VS:  BP 116/72 (BP Location: Right Arm, Patient Position: Sitting, Cuff Size: Large)   Pulse 70   Ht 5\' 5"  (1.651 m)   Wt 242 lb (109.8 kg)   SpO2 98%   BMI 40.27 kg/m  , BMI Body mass index is 40.27 kg/m. GEN: Well nourished, well developed, in no acute distress HEENT: Clear anicteric Neck: no JVD, carotid bruits, or masses Cardiac: RRR; no murmurs, rubs, or gallops,no edema  Respiratory:  clear to auscultation bilaterally, normal work of breathing GI: soft, obese nontender, nondistended, + BS MS: no deformity or atrophy Skin: warm and dry, no rash Neuro:  Strength and sensation are intact Psych: euthymic mood, full affect   EKG:  EKG is ordered today. The ekg ordered today demonstrates sinus rhythm, rate 70 bpm, no ST E/D   Recent Labs: No results found for requested labs within last 8760 hours.    Lipid Panel No results found for: CHOL, TRIG, HDL, CHOLHDL, VLDL, LDLCALC, LDLDIRECT    Wt Readings from Last 3 Encounters:  05/25/21 242 lb (109.8 kg)  12/28/20 230 lb 6.4 oz (104.5 kg)  11/24/20 230 lb (104.3 kg)      Other studies Reviewed: Additional studies/ records that were reviewed today include:   48-hour Holter monitor 2019: Predominant rhythm is normal sinus rhythm. There is some sinus bradycardia and some sinus tachycardia with sinus arrhythmia. Heart rate ranged from 57-125 bpm. Fastest rate was 155 bpm Rare isolated PACs and PVCs. Occasional dropped due to arrhythmia. No arrhythmia noted.   Essentially normal monitor. Patient did not notice a rapid sinus tachycardia Long external sinus tachycardia was on January 22 12:39 PM-1:46 PM (rate ranged from 120-155 bpm.    ASSESSMENT AND PLAN:  1.  Palpitations: Well-controlled on metoprolol succinate. -Continue metoprolol succinate  2.  HTN: BP 116/72 -Continue metoprolol succinate  and HCTZ  3.  Obesity: BMI 40 -Continue to encourage healthy dietary/lifestyle modifications to promote weight loss.  Now that back pain has improved hopeful she will be able to increase her activity   Current medicines are reviewed at length with the patient today.  The patient does not have concerns regarding medicines.  The following changes have been made: As above  Labs/ tests ordered today include:   Orders Placed This Encounter  Procedures   EKG 12-Lead     Disposition:   FU with Dr. Ellyn Hack in 1 year  Signed, Abigail Butts, PA-C  05/25/2021 4:54 PM

## 2021-05-25 NOTE — Patient Instructions (Addendum)
Medication Instructions:  No medication changes today. Please continue your medications as listed.  *If you need a refill on your cardiac medications before your next appointment, please call your pharmacy*  Lab Work: None  Testing/Procedures: None  Follow-Up: At [facility], you and your health needs are our priority.  As part of our continuing mission to provide you with exceptional heart care, we have created designated Provider Care Teams.  These Care Teams include your primary Cardiologist (physician) and Advanced Practice Providers (APPs -  Physician Assistants and Nurse Practitioners) who all work together to provide you with the care you need, when you need it.  We recommend signing up for the patient portal called "MyChart".  Sign up information is provided on this After Visit Summary.  MyChart is used to connect with patients for Virtual Visits (Telemedicine).  Patients are able to view lab/test results, encounter notes, upcoming appointments, etc.  Non-urgent messages can be sent to your provider as well.   To learn more about what you can do with MyChart, go to [email or web address].    Your next appointment:   1 year(s)  The format for your next appointment:   In Person  Provider:   Glenetta Hew, MD     Other Instructions  Have a great holiday season and start to 2023!

## 2021-06-25 ENCOUNTER — Ambulatory Visit
Admission: RE | Admit: 2021-06-25 | Discharge: 2021-06-25 | Disposition: A | Discharge status: Home / Self Care | Payer: 59 | Source: Ambulatory Visit | Attending: Family Medicine | Admitting: Family Medicine

## 2021-06-25 DIAGNOSIS — Z1231 Encounter for screening mammogram for malignant neoplasm of breast: Secondary | ICD-10-CM

## 2021-10-17 ENCOUNTER — Other Ambulatory Visit: Payer: Self-pay | Admitting: Adult Health

## 2021-11-06 ENCOUNTER — Ambulatory Visit: Payer: 59 | Admitting: Obstetrics & Gynecology

## 2021-11-06 ENCOUNTER — Encounter: Payer: Self-pay | Admitting: Obstetrics & Gynecology

## 2021-11-06 VITALS — BP 130/81 | HR 68 | Ht 65.5 in | Wt 251.0 lb

## 2021-11-06 DIAGNOSIS — N920 Excessive and frequent menstruation with regular cycle: Secondary | ICD-10-CM | POA: Diagnosis not present

## 2021-11-06 DIAGNOSIS — N946 Dysmenorrhea, unspecified: Secondary | ICD-10-CM

## 2021-11-06 DIAGNOSIS — D250 Submucous leiomyoma of uterus: Secondary | ICD-10-CM

## 2021-11-06 NOTE — Progress Notes (Signed)
Follow up appointment for response: ?Pt is s/p endometrial ablation 2019 ?Began having increased amount of pain with menses and now menses becoming heavier ?Sonogram reveals a few small fibroids, submucosal ?No dyspareunia ?No response to megestrol or micronor ? ?Chief Complaint  ?Patient presents with  ? Follow-up  ?  Discuss hysterectomy   ? ? ?Blood pressure 130/81, pulse 68, height 5' 5.5" (1.664 m), weight 251 lb (113.9 kg), last menstrual period 10/26/2021. ? ? ? ? ? ?MEDS ordered this encounter: ?No orders of the defined types were placed in this encounter. ? ? ?Orders for this encounter: ?No orders of the defined types were placed in this encounter. ? ? ?Impression + Management Plan ?  ICD-10-CM   ?1. Dysmenorrhea, chronic/worsening  N94.6   ? Plan abdominal supracervical hysterectomy 01/02/22 [facility]  ?  ?2. Submucous uterine fibroid  D25.0   ?  ?3. Menorrhagia with regular cycle  N92.0   ?  ? ? ?Follow Up: ?Return in about 2 months (around 01/11/2022) for [place], with Dr Elonda Husky. ? ? ? ? All questions were answered. ? ?Past Medical History:  ?Diagnosis Date  ? Anxiety   ? Asthma   ? Contraceptive management 03/07/2014  ? Depression   ? Essential hypertension   ? related to anxiety and stress per patient  ? Fibroids 07/22/2017  ? Heart palpitations   ? Migraine headache   ? Osteoarthritis of both knees   ? ? ?Past Surgical History:  ?Procedure Laterality Date  ? HYSTEROSCOPY N/A 11/07/2017  ? Procedure: HYSTEROSCOPY WITH HYDROTHERMAL ABLATION;  Surgeon: Jonnie Kind, MD;  Location: [facility];  Service: Gynecology;  Laterality: N/A;  ? LAPAROSCOPIC BILATERAL SALPINGECTOMY Bilateral 11/07/2017  ? Procedure: LAPAROSCOPIC BILATERAL SALPINGECTOMY;  Surgeon: Jonnie Kind, MD;  Location: [facility];  Service: Gynecology;  Laterality: Bilateral;  ? LEEP  1995  ? WISDOM TOOTH EXTRACTION    ? ? ?OB History   ? ? Gravida  ?1  ? Para  ?1  ? Term  ?1  ? Preterm  ?   ? AB  ?   ? Living  ?1  ?  ? ? SAB  ?   ? IAB  ?   ? Ectopic  ?    ? Multiple  ?   ? Live Births  ?1  ?   ?  ?  ? ? ?No Known Allergies ? ?Social History  ? ?Socioeconomic History  ? Marital status: Married  ?  Spouse name: Not on file  ? Number of children: 1  ? Years of education: 57  ? Highest education level: Not on file  ?Occupational History  ? Not on file  ?Tobacco Use  ? Smoking status: Never  ? Smokeless tobacco: Never  ?Vaping Use  ? Vaping Use: Never used  ?Substance and Sexual Activity  ? Alcohol use: Yes  ?  Comment: occ beer or wine  ? Drug use: No  ? Sexual activity: Yes  ?  Birth control/protection: Surgical  ?  Comment: tubal and ablation  ?Other Topics Concern  ? Not on file  ?Social History Narrative  ? She has been married for 3-1/2 years. She lives with her husband and daughter (67 year old). She has a high school education, but is not currently working. She does not exercise because of lack of motivation. She does not drink alcohol or smoke.  ? ?Social Determinants of Health  ? ?Financial Resource Strain: Not on file  ?Food Insecurity: Not on file  ?  Transportation Needs: Not on file  ?Physical Activity: Not on file  ?Stress: Not on file  ?Social Connections: Not on file  ? ? ?Family History  ?Problem Relation Age of Onset  ? Breast cancer Mother   ? Healthy Mother   ? Healthy Father   ? Breast cancer Maternal Aunt   ? Breast cancer Maternal Aunt   ? Cancer Maternal Grandfather   ?     lung  ? Healthy Brother   ? ? ?

## 2021-11-14 ENCOUNTER — Telehealth: Payer: Self-pay

## 2021-11-14 NOTE — Telephone Encounter (Signed)
CLOSE

## 2021-12-13 ENCOUNTER — Encounter: Payer: Self-pay | Admitting: Obstetrics & Gynecology

## 2021-12-18 ENCOUNTER — Encounter: Payer: Self-pay | Admitting: Internal Medicine

## 2021-12-19 ENCOUNTER — Encounter: Payer: Self-pay | Admitting: Obstetrics & Gynecology

## 2021-12-31 ENCOUNTER — Other Ambulatory Visit (HOSPITAL_COMMUNITY): Payer: 59

## 2022-01-01 DIAGNOSIS — Z029 Encounter for administrative examinations, unspecified: Secondary | ICD-10-CM

## 2022-01-02 NOTE — Patient Instructions (Signed)
Your procedure is scheduled on: 01/09/2022  Report to [facility] at    9:30 AM.  Call this number if you have problems the morning of surgery: [phone]   Remember:   Do not Eat or Drink after midnight         No Smoking the morning of surgery  :  Take these medicines the morning of surgery with A SIP OF WATER: klonopin and imitrex if needed   Do not wear jewelry, make-up or nail polish.  Do not wear lotions, powders, or perfumes. You may wear deodorant.  Do not shave 48 hours prior to surgery. Men may shave face and neck.  Do not bring valuables to the hospital.  Contacts, dentures or bridgework may not be worn into surgery.  Leave suitcase in the car. After surgery it may be brought to your room.  For patients admitted to the hospital, checkout time is 11:00 AM the day of discharge.   Patients discharged the day of surgery will not be allowed to drive home.    Special Instructions: Shower using CHG night before surgery and shower the day of surgery use CHG.  Use special wash - you have one bottle of CHG for all showers.  You should use approximately 1/2 of the bottle for each shower.  How to Use Chlorhexidine for Bathing Chlorhexidine gluconate (CHG) is a germ-killing (antiseptic) solution that is used to clean the skin. It can get rid of the bacteria that normally live on the skin and can keep them away for about 24 hours. To clean your skin with CHG, you may be given: A CHG solution to use in the shower or as part of a sponge bath. A prepackaged cloth that contains CHG. Cleaning your skin with CHG may help lower the risk for infection: While you are staying in the intensive care unit of the hospital. If you have a vascular access, such as a central line, to provide short-term or long-term access to your veins. If you have a catheter to drain urine from your bladder. If you are on a ventilator. A ventilator is a machine that helps you breathe by moving air in and  out of your lungs. After surgery. What are the risks? Risks of using CHG include: A skin reaction. Hearing loss, if CHG gets in your ears and you have a perforated eardrum. Eye injury, if CHG gets in your eyes and is not rinsed out. The CHG product catching fire. Make sure that you avoid smoking and flames after applying CHG to your skin. Do not use CHG: If you have a chlorhexidine allergy or have previously reacted to chlorhexidine. On babies younger than 50 months of age. How to use CHG solution Use CHG only as told by your health care provider, and follow the instructions on the label. Use the full amount of CHG as directed. Usually, this is one bottle. During a shower Follow these steps when using CHG solution during a shower (unless your health care provider gives you different instructions): Start the shower. Use your normal soap and shampoo to wash your face and hair. Turn off the shower or move out of the shower stream. Pour the CHG onto a clean washcloth. Do not use any type of brush or rough-edged sponge. Starting at your neck, lather your body down to your toes. Make sure you follow these instructions: If you will be having surgery, pay special attention to the part of your body where you will  be having surgery. Scrub this area for at least 1 minute. Do not use CHG on your head or face. If the solution gets into your ears or eyes, rinse them well with water. Avoid your genital area. Avoid any areas of skin that have broken skin, cuts, or scrapes. Scrub your back and under your arms. Make sure to wash skin folds. Let the lather sit on your skin for 1-2 minutes or as long as told by your health care provider. Thoroughly rinse your entire body in the shower. Make sure that all body creases and crevices are rinsed well. Dry off with a clean towel. Do not put any substances on your body afterward--such as powder, lotion, or perfume--unless you are told to do so by your health care  provider. Only use lotions that are recommended by the manufacturer. Put on clean clothes or pajamas. If it is the night before your surgery, sleep in clean sheets.  During a sponge bath Follow these steps when using CHG solution during a sponge bath (unless your health care provider gives you different instructions): Use your normal soap and shampoo to wash your face and hair. Pour the CHG onto a clean washcloth. Starting at your neck, lather your body down to your toes. Make sure you follow these instructions: If you will be having surgery, pay special attention to the part of your body where you will be having surgery. Scrub this area for at least 1 minute. Do not use CHG on your head or face. If the solution gets into your ears or eyes, rinse them well with water. Avoid your genital area. Avoid any areas of skin that have broken skin, cuts, or scrapes. Scrub your back and under your arms. Make sure to wash skin folds. Let the lather sit on your skin for 1-2 minutes or as long as told by your health care provider. Using a different clean, wet washcloth, thoroughly rinse your entire body. Make sure that all body creases and crevices are rinsed well. Dry off with a clean towel. Do not put any substances on your body afterward--such as powder, lotion, or perfume--unless you are told to do so by your health care provider. Only use lotions that are recommended by the manufacturer. Put on clean clothes or pajamas. If it is the night before your surgery, sleep in clean sheets. How to use CHG prepackaged cloths Only use CHG cloths as told by your health care provider, and follow the instructions on the label. Use the CHG cloth on clean, dry skin. Do not use the CHG cloth on your head or face unless your health care provider tells you to. When washing with the CHG cloth: Avoid your genital area. Avoid any areas of skin that have broken skin, cuts, or scrapes. Before surgery Follow these steps  when using a CHG cloth to clean before surgery (unless your health care provider gives you different instructions): Using the CHG cloth, vigorously scrub the part of your body where you will be having surgery. Scrub using a back-and-forth motion for 3 minutes. The area on your body should be completely wet with CHG when you are done scrubbing. Do not rinse. Discard the cloth and let the area air-dry. Do not put any substances on the area afterward, such as powder, lotion, or perfume. Put on clean clothes or pajamas. If it is the night before your surgery, sleep in clean sheets.  For general bathing Follow these steps when using CHG cloths for general bathing (unless  your health care provider gives you different instructions). Use a separate CHG cloth for each area of your body. Make sure you wash between any folds of skin and between your fingers and toes. Wash your body in the following order, switching to a new cloth after each step: The front of your neck, shoulders, and chest. Both of your arms, under your arms, and your hands. Your stomach and groin area, avoiding the genitals. Your right leg and foot. Your left leg and foot. The back of your neck, your back, and your buttocks. Do not rinse. Discard the cloth and let the area air-dry. Do not put any substances on your body afterward--such as powder, lotion, or perfume--unless you are told to do so by your health care provider. Only use lotions that are recommended by the manufacturer. Put on clean clothes or pajamas. Contact a health care provider if: Your skin gets irritated after scrubbing. You have questions about using your solution or cloth. You swallow any chlorhexidine. Call your local poison control center (1-5393517825 in the [place] Get help right away if: Your eyes itch badly, or they become very red or swollen. Your skin itches badly and is red or swollen. Your hearing changes. You have trouble seeing. You have swelling or  tingling in your mouth or throat. You have trouble breathing. These symptoms may represent a serious problem that is an emergency. Do not wait to see if the symptoms will go away. Get medical help right away. Call your local emergency services (911 in the [place] Do not drive yourself to the hospital. Summary Chlorhexidine gluconate (CHG) is a germ-killing (antiseptic) solution that is used to clean the skin. Cleaning your skin with CHG may help to lower your risk for infection. You may be given CHG to use for bathing. It may be in a bottle or in a prepackaged cloth to use on your skin. Carefully follow your health care provider's instructions and the instructions on the product label. Do not use CHG if you have a chlorhexidine allergy. Contact your health care provider if your skin gets irritated after scrubbing. This information is not intended to replace advice given to you by your health care provider. Make sure you discuss any questions you have with your health care provider. Document Revised: 09/11/2020 Document Reviewed: 09/11/2020 Elsevier Patient Education  [place]. Abdominal Hysterectomy, Care After The following information offers guidance on how to care for yourself after your procedure. Your doctor may also give you more specific instructions. If you have problems or questions, contact your doctor. What can I expect after the procedure? After the procedure, it is common to have: Pain. Tiredness. No desire to eat. Less interest in sex. Bleeding and fluid (discharge) from your vagina. You may need to use a pad after this procedure. Trouble pooping (constipation). Feelings of sadness or other emotions. Follow these instructions at home: Medicines Take over-the-counter and prescription medicines only as told by your doctor. If you were prescribed an antibiotic medicine, take it as told by your doctor. Do not stop using the antibiotic even if you start to feel better. If  told, take steps to prevent problems with pooping (constipation). You may need to: Drink enough fluid to keep your pee (urine) pale yellow. Take medicines. You will be told what medicines to take. Eat foods that are high in fiber. These include beans, whole grains, and fresh fruits and vegetables. Limit foods that are high in fat and sugar. These include fried or sweet  foods. Ask your doctor if you should avoid driving or using machines while you are taking your medicine. Surgical cut care  Follow instructions from your doctor about how to take care of your cut from surgery (incision). Make sure you: Wash your hands with soap and water for at least 20 seconds before and after you change your bandage. If you cannot use soap and water, use hand sanitizer. Change your bandage. Leave stitches or skin glue in place for at least two weeks. Leave tape strips alone unless you are told to take them off. You may trim the edges of the tape strips if they curl up. Keep the bandage dry until your doctor says it can be taken off. Check your incision every day for signs of infection. Check for: More redness, swelling, or pain. Fluid or blood. Warmth. Pus or a bad smell. Activity  Rest as told by your doctor. Get up to take short walks every 1 to 2 hours. Ask for help if you feel weak or unsteady. Do not lift anything that is heavier than 10 lb (4.5 kg), or the limit that you are told. Follow your doctor's advice about exercise, driving, and general activities. Return to your normal activities when your doctor says that it is safe. Lifestyle Do not douche, use tampons, or have sex for at least 6 weeks or as told by your doctor. Do not drink alcohol until your doctor says it is okay. Do not smoke or use any products that contain nicotine or tobacco. These can delay healing after surgery. If you need help quitting, ask your doctor. General instructions Do not take baths, swim, or use a hot tub. Ask your  doctor about taking showers or sponge baths. Try to have a responsible adult at home with you for the first 1-2 weeks to help with your daily chores. Wear tight-fitting (compression) stockings as told by your doctor. Keep all follow-up visits. Contact a doctor if: You have chills or a fever. You have any of these signs of infection around your cut: More redness, swelling, or pain. Fluid or blood. Warmth. Pus or a bad smell. Your cut breaks open. You feel dizzy or light-headed. You have pain or bleeding when you pee. You keep having watery poop (diarrhea). You keep feeling like you may vomit or you keep vomiting. You have fluid coming from your vagina that is not normal. You have any type of reaction to your medicine that is not normal, like a rash, or you develop an allergy to your medicine. Your pain medicine does not help. Get help right away if: You have a fever and your symptoms get worse suddenly. You have very bad pain in your belly (abdomen). You are short of breath. You faint. You have pain, swelling, or redness of your leg. You bleed a lot from your vagina and you see blood clots. Summary It is normal to have some pain, tiredness, and fluid that comes from your vagina. Do not take baths, swim, or use a hot tub. Ask your doctor about taking showers or sponge baths. Do not lift anything that is heavier than 10 lb (4.5 kg), or the limit that you are told. Follow your doctor's advice about exercise, driving, and general activities. Try to have a responsible adult at home with you for the first 1-2 weeks to help with your daily chores. This information is not intended to replace advice given to you by your health care provider. Make sure you discuss any questions  you have with your health care provider. Document Revised: 09/08/2020 Document Reviewed: 03/02/2020 Elsevier Patient Education  [place] Anesthesia, Adult, Care After This sheet gives you  information about how to care for yourself after your procedure. Your health care provider may also give you more specific instructions. If you have problems or questions, contact your health care provider. What can I expect after the procedure? After the procedure, the following side effects are common: Pain or discomfort at the IV site. Nausea. Vomiting. Sore throat. Trouble concentrating. Feeling cold or chills. Feeling weak or tired. Sleepiness and fatigue. Soreness and body aches. These side effects can affect parts of the body that were not involved in surgery. Follow these instructions at home: For the time period you were told by your health care provider:  Rest. Do not participate in activities where you could fall or become injured. Do not drive or use machinery. Do not drink alcohol. Do not take sleeping pills or medicines that cause drowsiness. Do not make important decisions or sign legal documents. Do not take care of children on your own. Eating and drinking Follow any instructions from your health care provider about eating or drinking restrictions. When you feel hungry, start by eating small amounts of foods that are soft and easy to digest (bland), such as toast. Gradually return to your regular diet. Drink enough fluid to keep your urine pale yellow. If you vomit, rehydrate by drinking water, juice, or clear broth. General instructions If you have sleep apnea, surgery and certain medicines can increase your risk for breathing problems. Follow instructions from your health care provider about wearing your sleep device: Anytime you are sleeping, including during daytime naps. While taking prescription pain medicines, sleeping medicines, or medicines that make you drowsy. Have a responsible adult stay with you for the time you are told. It is important to have someone help care for you until you are awake and alert. Return to your normal activities as told by your  health care provider. Ask your health care provider what activities are safe for you. Take over-the-counter and prescription medicines only as told by your health care provider. If you smoke, do not smoke without supervision. Keep all follow-up visits as told by your health care provider. This is important. Contact a health care provider if: You have nausea or vomiting that does not get better with medicine. You cannot eat or drink without vomiting. You have pain that does not get better with medicine. You are unable to pass urine. You develop a skin rash. You have a fever. You have redness around your IV site that gets worse. Get help right away if: You have difficulty breathing. You have chest pain. You have blood in your urine or stool, or you vomit blood. Summary After the procedure, it is common to have a sore throat or nausea. It is also common to feel tired. Have a responsible adult stay with you for the time you are told. It is important to have someone help care for you until you are awake and alert. When you feel hungry, start by eating small amounts of foods that are soft and easy to digest (bland), such as toast. Gradually return to your regular diet. Drink enough fluid to keep your urine pale yellow. Return to your normal activities as told by your health care provider. Ask your health care provider what activities are safe for you. This information is not intended to replace advice given to you by  your health care provider. Make sure you discuss any questions you have with your health care provider. Document Revised: 03/16/2020 Document Reviewed: 10/14/2019 Elsevier Patient Education  [place].

## 2022-01-07 ENCOUNTER — Encounter (HOSPITAL_COMMUNITY)
Admission: RE | Admit: 2022-01-07 | Discharge: 2022-01-07 | Disposition: A | Discharge status: Home / Self Care | Payer: 59 | Source: Ambulatory Visit | Attending: Obstetrics & Gynecology | Admitting: Obstetrics & Gynecology

## 2022-01-07 ENCOUNTER — Other Ambulatory Visit: Payer: Self-pay | Admitting: Obstetrics & Gynecology

## 2022-01-07 ENCOUNTER — Other Ambulatory Visit: Payer: Self-pay

## 2022-01-07 ENCOUNTER — Other Ambulatory Visit (HOSPITAL_COMMUNITY)
Admission: RE | Admit: 2022-01-07 | Discharge: 2022-01-07 | Disposition: A | Discharge status: Home / Self Care | Payer: 59 | Source: Ambulatory Visit | Attending: Obstetrics & Gynecology | Admitting: Obstetrics & Gynecology

## 2022-01-07 ENCOUNTER — Encounter (HOSPITAL_COMMUNITY): Payer: Self-pay

## 2022-01-07 VITALS — BP 130/94 | HR 77 | Temp 98.2°F | Resp 18 | Ht 65.5 in | Wt 247.0 lb

## 2022-01-07 DIAGNOSIS — N921 Excessive and frequent menstruation with irregular cycle: Secondary | ICD-10-CM | POA: Diagnosis not present

## 2022-01-07 DIAGNOSIS — Z01812 Encounter for preprocedural laboratory examination: Secondary | ICD-10-CM | POA: Insufficient documentation

## 2022-01-07 DIAGNOSIS — Z01818 Encounter for other preprocedural examination: Secondary | ICD-10-CM

## 2022-01-07 LAB — RAPID HIV SCREEN (HIV 1/2 AB+AG)
HIV 1/2 Antibodies: NONREACTIVE
HIV-1 P24 Antigen - HIV24: NONREACTIVE

## 2022-01-07 LAB — CBC WITH DIFFERENTIAL/PLATELET
Abs Immature Granulocytes: 0.02 10*3/uL (ref 0.00–0.07)
Basophils Absolute: 0 10*3/uL (ref 0.0–0.1)
Basophils Relative: 1 %
Eosinophils Absolute: 0.2 10*3/uL (ref 0.0–0.5)
Eosinophils Relative: 4 %
HCT: 42.6 % (ref 36.0–46.0)
Hemoglobin: 13.7 g/dL (ref 12.0–15.0)
Immature Granulocytes: 0 %
Lymphocytes Relative: 48 %
Lymphs Abs: 2.8 10*3/uL (ref 0.7–4.0)
MCH: 28.5 pg (ref 26.0–34.0)
MCHC: 32.2 g/dL (ref 30.0–36.0)
MCV: 88.8 fL (ref 80.0–100.0)
Monocytes Absolute: 0.4 10*3/uL (ref 0.1–1.0)
Monocytes Relative: 8 %
Neutro Abs: 2.2 10*3/uL (ref 1.7–7.7)
Neutrophils Relative %: 39 %
Platelets: 318 10*3/uL (ref 150–400)
RBC: 4.8 MIL/uL (ref 3.87–5.11)
RDW: 14.2 % (ref 11.5–15.5)
WBC: 5.7 10*3/uL (ref 4.0–10.5)
nRBC: 0 % (ref 0.0–0.2)

## 2022-01-07 LAB — COMPREHENSIVE METABOLIC PANEL
ALT: 16 U/L (ref 0–44)
AST: 14 U/L — ABNORMAL LOW (ref 15–41)
Albumin: 3.9 g/dL (ref 3.5–5.0)
Alkaline Phosphatase: 55 U/L (ref 38–126)
Anion gap: 9 (ref 5–15)
BUN: 13 mg/dL (ref 6–20)
CO2: 23 mmol/L (ref 22–32)
Calcium: 9 mg/dL (ref 8.9–10.3)
Chloride: 106 mmol/L (ref 98–111)
Creatinine, Ser: 0.75 mg/dL (ref 0.44–1.00)
GFR, Estimated: >60 mL/min (ref >60)
Glucose, Bld: 96 mg/dL (ref 70–99)
Potassium: 3.6 mmol/L (ref 3.5–5.1)
Sodium: 138 mmol/L (ref 135–145)
Total Bilirubin: 0.6 mg/dL (ref 0.3–1.2)
Total Protein: 7.6 g/dL (ref 6.5–8.1)

## 2022-01-07 LAB — URINALYSIS, ROUTINE W REFLEX MICROSCOPIC
Bilirubin Urine: NEGATIVE
Glucose, UA: NEGATIVE mg/dL
Hgb urine dipstick: NEGATIVE
Ketones, ur: NEGATIVE mg/dL
Leukocytes,Ua: NEGATIVE
Nitrite: NEGATIVE
Protein, ur: NEGATIVE mg/dL
Specific Gravity, Urine: 1.017 (ref 1.005–1.030)
pH: 7 (ref 5.0–8.0)

## 2022-01-07 LAB — SURGICAL PCR SCREEN
MRSA, PCR: NEGATIVE
Staphylococcus aureus: NEGATIVE

## 2022-01-07 LAB — TYPE AND SCREEN
ABO/RH(D): O POS
Antibody Screen: NEGATIVE

## 2022-01-07 LAB — POCT PREGNANCY, URINE: Preg Test, Ur: NEGATIVE

## 2022-01-09 ENCOUNTER — Other Ambulatory Visit: Payer: Self-pay | Admitting: Obstetrics & Gynecology

## 2022-01-09 ENCOUNTER — Other Ambulatory Visit: Payer: Self-pay

## 2022-01-09 ENCOUNTER — Encounter (HOSPITAL_COMMUNITY): Payer: Self-pay | Admitting: Obstetrics & Gynecology

## 2022-01-09 ENCOUNTER — Encounter (HOSPITAL_COMMUNITY)
Admission: AD | Disposition: A | Discharge status: Home / Self Care | Payer: Self-pay | Source: Home / Self Care | Attending: Obstetrics & Gynecology

## 2022-01-09 ENCOUNTER — Ambulatory Visit (HOSPITAL_COMMUNITY): Payer: 59 | Admitting: Anesthesiology

## 2022-01-09 ENCOUNTER — Ambulatory Visit (HOSPITAL_BASED_OUTPATIENT_CLINIC_OR_DEPARTMENT_OTHER): Payer: 59 | Admitting: Anesthesiology

## 2022-01-09 ENCOUNTER — Ambulatory Visit (HOSPITAL_COMMUNITY)
Admission: AD | Admit: 2022-01-09 | Discharge: 2022-01-09 | Disposition: A | Discharge status: Home / Self Care | Payer: 59 | Attending: Obstetrics & Gynecology | Admitting: Obstetrics & Gynecology

## 2022-01-09 ENCOUNTER — Telehealth: Payer: Self-pay

## 2022-01-09 DIAGNOSIS — N920 Excessive and frequent menstruation with regular cycle: Secondary | ICD-10-CM | POA: Diagnosis not present

## 2022-01-09 DIAGNOSIS — Z6841 Body Mass Index (BMI) 40.0 and over, adult: Secondary | ICD-10-CM | POA: Diagnosis not present

## 2022-01-09 DIAGNOSIS — R519 Headache, unspecified: Secondary | ICD-10-CM | POA: Insufficient documentation

## 2022-01-09 DIAGNOSIS — D251 Intramural leiomyoma of uterus: Secondary | ICD-10-CM | POA: Diagnosis not present

## 2022-01-09 DIAGNOSIS — Z9079 Acquired absence of other genital organ(s): Secondary | ICD-10-CM | POA: Insufficient documentation

## 2022-01-09 DIAGNOSIS — J45909 Unspecified asthma, uncomplicated: Secondary | ICD-10-CM | POA: Insufficient documentation

## 2022-01-09 DIAGNOSIS — F419 Anxiety disorder, unspecified: Secondary | ICD-10-CM | POA: Insufficient documentation

## 2022-01-09 DIAGNOSIS — F32A Depression, unspecified: Secondary | ICD-10-CM | POA: Diagnosis not present

## 2022-01-09 DIAGNOSIS — D250 Submucous leiomyoma of uterus: Secondary | ICD-10-CM | POA: Diagnosis not present

## 2022-01-09 DIAGNOSIS — N8003 Adenomyosis of the uterus: Secondary | ICD-10-CM | POA: Insufficient documentation

## 2022-01-09 DIAGNOSIS — N946 Dysmenorrhea, unspecified: Secondary | ICD-10-CM

## 2022-01-09 DIAGNOSIS — F418 Other specified anxiety disorders: Secondary | ICD-10-CM | POA: Diagnosis not present

## 2022-01-09 DIAGNOSIS — I10 Essential (primary) hypertension: Secondary | ICD-10-CM | POA: Insufficient documentation

## 2022-01-09 DIAGNOSIS — D252 Subserosal leiomyoma of uterus: Secondary | ICD-10-CM

## 2022-01-09 HISTORY — PX: SUPRACERVICAL ABDOMINAL HYSTERECTOMY: SHX5393

## 2022-01-09 SURGERY — HYSTERECTOMY, SUPRACERVICAL, ABDOMINAL
Anesthesia: General | Site: Abdomen

## 2022-01-09 MED ORDER — LACTATED RINGERS IV SOLN: INTRAVENOUS | Status: DC | PRN

## 2022-01-09 MED ORDER — HYDROMORPHONE HCL 1 MG/ML IJ SOLN
INTRAMUSCULAR | Status: AC
  Filled 2022-01-09: qty 0.5

## 2022-01-09 MED ORDER — GABAPENTIN 300 MG PO CAPS: 300.0000 mg | ORAL_CAPSULE | Freq: Three times a day (TID) | ORAL | 0 refills | Status: DC

## 2022-01-09 MED ORDER — KETOROLAC TROMETHAMINE 30 MG/ML IJ SOLN: 30.0000 mg | Freq: Once | INTRAMUSCULAR | Status: AC

## 2022-01-09 MED ORDER — ONDANSETRON HCL 4 MG/2ML IJ SOLN
INTRAMUSCULAR | Status: AC
  Filled 2022-01-09: qty 4

## 2022-01-09 MED ORDER — FENTANYL CITRATE (PF) 100 MCG/2ML IJ SOLN
INTRAMUSCULAR | Status: AC
  Filled 2022-01-09: qty 2

## 2022-01-09 MED ORDER — CEFAZOLIN SODIUM-DEXTROSE 2-4 GM/100ML-% IV SOLN
INTRAVENOUS | Status: AC
  Filled 2022-01-09: qty 100

## 2022-01-09 MED ORDER — OXYCODONE-ACETAMINOPHEN 5-325 MG PO TABS: 1.0000 | ORAL_TABLET | ORAL | 0 refills | Status: DC | PRN

## 2022-01-09 MED ORDER — PROPOFOL 10 MG/ML IV BOLUS
INTRAVENOUS | Status: DC | PRN
  Administered 2022-01-09: 120 mg via INTRAVENOUS

## 2022-01-09 MED ORDER — LIDOCAINE HCL (CARDIAC) PF 50 MG/5ML IV SOSY
PREFILLED_SYRINGE | INTRAVENOUS | Status: DC | PRN
  Administered 2022-01-09: 100 mg via INTRAVENOUS

## 2022-01-09 MED ORDER — FENTANYL CITRATE PF 50 MCG/ML IJ SOSY: 50.0000 ug | PREFILLED_SYRINGE | INTRAMUSCULAR | Status: DC | PRN

## 2022-01-09 MED ORDER — CEFAZOLIN SODIUM-DEXTROSE 2-4 GM/100ML-% IV SOLN
2.0000 g | INTRAVENOUS | Status: AC
  Administered 2022-01-09: 2 g via INTRAVENOUS

## 2022-01-09 MED ORDER — KETOROLAC TROMETHAMINE 30 MG/ML IJ SOLN
INTRAMUSCULAR | Status: AC
  Administered 2022-01-09: 30 mg via INTRAVENOUS
  Filled 2022-01-09: qty 1

## 2022-01-09 MED ORDER — KETOROLAC TROMETHAMINE 10 MG PO TABS: 10.0000 mg | ORAL_TABLET | Freq: Three times a day (TID) | ORAL | 0 refills | Status: DC | PRN

## 2022-01-09 MED ORDER — PROPOFOL 10 MG/ML IV BOLUS
INTRAVENOUS | Status: AC
  Filled 2022-01-09: qty 20

## 2022-01-09 MED ORDER — ALBUTEROL SULFATE HFA 108 (90 BASE) MCG/ACT IN AERS
INHALATION_SPRAY | RESPIRATORY_TRACT | Status: AC
  Filled 2022-01-09: qty 6.7

## 2022-01-09 MED ORDER — PROMETHAZINE HCL 25 MG PO TABS: 25.0000 mg | ORAL_TABLET | Freq: Four times a day (QID) | ORAL | 1 refills | Status: DC | PRN

## 2022-01-09 MED ORDER — ONDANSETRON HCL 4 MG/2ML IJ SOLN
INTRAMUSCULAR | Status: DC | PRN
  Administered 2022-01-09: 4 mg via INTRAVENOUS

## 2022-01-09 MED ORDER — ONDANSETRON 8 MG PO TBDP: 8.0000 mg | ORAL_TABLET | Freq: Three times a day (TID) | ORAL | 0 refills | Status: DC | PRN

## 2022-01-09 MED ORDER — ONDANSETRON HCL 4 MG/2ML IJ SOLN: 4.0000 mg | Freq: Once | INTRAMUSCULAR | Status: DC | PRN

## 2022-01-09 MED ORDER — METOCLOPRAMIDE HCL 5 MG/ML IJ SOLN
INTRAMUSCULAR | Status: DC | PRN
  Administered 2022-01-09: 10 mg via INTRAVENOUS

## 2022-01-09 MED ORDER — 0.9 % SODIUM CHLORIDE (POUR BTL) OPTIME
TOPICAL | Status: DC | PRN
  Administered 2022-01-09: 1000 mL

## 2022-01-09 MED ORDER — HEMOSTATIC AGENTS (NO CHARGE) OPTIME
TOPICAL | Status: DC | PRN
  Administered 2022-01-09: 1 via TOPICAL

## 2022-01-09 MED ORDER — KETOROLAC TROMETHAMINE 30 MG/ML IJ SOLN
30.0000 mg | Freq: Once | INTRAMUSCULAR | Status: AC
  Administered 2022-01-09: 30 mg via INTRAMUSCULAR
  Filled 2022-01-09: qty 1

## 2022-01-09 MED ORDER — FENTANYL CITRATE (PF) 100 MCG/2ML IJ SOLN
INTRAMUSCULAR | Status: DC | PRN
Start: 2022-01-09 — End: 2022-01-09
  Administered 2022-01-09 (×2): 75 ug via INTRAVENOUS
  Administered 2022-01-09: 100 ug via INTRAVENOUS

## 2022-01-09 MED ORDER — ROCURONIUM BROMIDE 10 MG/ML (PF) SYRINGE
PREFILLED_SYRINGE | INTRAVENOUS | Status: AC
  Filled 2022-01-09: qty 20

## 2022-01-09 MED ORDER — MIDAZOLAM HCL 2 MG/2ML IJ SOLN
INTRAMUSCULAR | Status: AC
  Filled 2022-01-09: qty 2

## 2022-01-09 MED ORDER — FENTANYL CITRATE (PF) 250 MCG/5ML IJ SOLN
INTRAMUSCULAR | Status: AC
  Filled 2022-01-09: qty 5

## 2022-01-09 MED ORDER — SUGAMMADEX SODIUM 200 MG/2ML IV SOLN
INTRAVENOUS | Status: DC | PRN
  Administered 2022-01-09: 200 mg via INTRAVENOUS

## 2022-01-09 MED ORDER — ROCURONIUM 10MG/ML (10ML) SYRINGE FOR MEDFUSION PUMP - OPTIME
INTRAVENOUS | Status: DC | PRN
  Administered 2022-01-09: 50 mg via INTRAVENOUS
  Administered 2022-01-09: 10 mg via INTRAVENOUS

## 2022-01-09 MED ORDER — DEXAMETHASONE SODIUM PHOSPHATE 10 MG/ML IJ SOLN
INTRAMUSCULAR | Status: AC
  Filled 2022-01-09: qty 2

## 2022-01-09 MED ORDER — ALBUTEROL SULFATE HFA 108 (90 BASE) MCG/ACT IN AERS
INHALATION_SPRAY | RESPIRATORY_TRACT | Status: DC | PRN
  Administered 2022-01-09: 2 via RESPIRATORY_TRACT

## 2022-01-09 MED ORDER — FENTANYL CITRATE PF 50 MCG/ML IJ SOSY: 25.0000 ug | PREFILLED_SYRINGE | INTRAMUSCULAR | Status: DC | PRN

## 2022-01-09 MED ORDER — BUPIVACAINE-MELOXICAM ER 200-6 MG/7ML IJ SOLN
200.0000 mg | Freq: Once | INTRAMUSCULAR | Status: DC
  Filled 2022-01-09: qty 1

## 2022-01-09 MED ORDER — METOCLOPRAMIDE HCL 5 MG/ML IJ SOLN
INTRAMUSCULAR | Status: AC
  Filled 2022-01-09: qty 2

## 2022-01-09 MED ORDER — HYDROMORPHONE HCL 1 MG/ML IJ SOLN
0.5000 mg | INTRAMUSCULAR | Status: DC | PRN
  Administered 2022-01-09: 0.5 mg via INTRAVENOUS

## 2022-01-09 MED ORDER — POVIDONE-IODINE 10 % EX SWAB
2.0000 | Freq: Once | CUTANEOUS | Status: AC
  Administered 2022-01-09: 2 via TOPICAL

## 2022-01-09 MED ORDER — BUPIVACAINE-MELOXICAM ER 200-6 MG/7ML IJ SOLN
INTRAMUSCULAR | Status: DC | PRN
  Administered 2022-01-09: 200 mg

## 2022-01-09 MED ORDER — MIDAZOLAM HCL 5 MG/5ML IJ SOLN
INTRAMUSCULAR | Status: DC | PRN
  Administered 2022-01-09: 2 mg via INTRAVENOUS

## 2022-01-09 MED ORDER — OXYCODONE-ACETAMINOPHEN 7.5-325 MG PO TABS: 1.0000 | ORAL_TABLET | ORAL | Status: DC | PRN

## 2022-01-09 MED ORDER — METOCLOPRAMIDE HCL 5 MG/ML IJ SOLN: 10.0000 mg | Freq: Once | INTRAMUSCULAR | Status: AC

## 2022-01-09 SURGICAL SUPPLY — 58 items
ADH SKN CLS APL DERMABOND .7 (GAUZE/BANDAGES/DRESSINGS) ×1
APL SWBSTK 6 STRL LF DISP (MISCELLANEOUS) ×1
APPLICATOR COTTON TIP 6 STRL (MISCELLANEOUS) ×2 IMPLANT
APPLICATOR COTTON TIP 6IN STRL (MISCELLANEOUS) ×2
BAG HAMPER (MISCELLANEOUS) ×3 IMPLANT
BLADE SURG SZ10 CARB STEEL (BLADE) ×3 IMPLANT
CLOTH BEACON ORANGE TIMEOUT ST (SAFETY) ×3 IMPLANT
COVER LIGHT HANDLE STERIS (MISCELLANEOUS) ×6 IMPLANT
DERMABOND ADVANCED (GAUZE/BANDAGES/DRESSINGS) ×1
DERMABOND ADVANCED .7 DNX12 (GAUZE/BANDAGES/DRESSINGS) ×2 IMPLANT
DRAPE LAPAROTOMY TRNSV 102X78 (DRAPES) ×1 IMPLANT
DRAPE WARM FLUID 44X44 (DRAPES) ×3 IMPLANT
DRSG OPSITE POSTOP 4X8 (GAUZE/BANDAGES/DRESSINGS) ×3 IMPLANT
ELECT REM PT RETURN 9FT ADLT (ELECTROSURGICAL) ×2
ELECTRODE REM PT RTRN 9FT ADLT (ELECTROSURGICAL) ×2 IMPLANT
GAUZE 4X4 16PLY ~~LOC~~+RFID DBL (SPONGE) ×4 IMPLANT
GLOVE BIO SURGEON STRL SZ7.5 (GLOVE) ×1 IMPLANT
GLOVE BIOGEL PI IND STRL 7.0 (GLOVE) ×4 IMPLANT
GLOVE BIOGEL PI IND STRL 7.5 (GLOVE) IMPLANT
GLOVE BIOGEL PI IND STRL 8 (GLOVE) ×2 IMPLANT
GLOVE BIOGEL PI INDICATOR 7.0 (GLOVE) ×5
GLOVE BIOGEL PI INDICATOR 7.5 (GLOVE) ×1
GLOVE BIOGEL PI INDICATOR 8 (GLOVE) ×2
GLOVE ECLIPSE 6.5 STRL STRAW (GLOVE) ×1 IMPLANT
GLOVE ECLIPSE 8.0 STRL XLNG CF (GLOVE) ×6 IMPLANT
GLOVE SRG 8 PF TXTR STRL LF DI (GLOVE) ×2 IMPLANT
GLOVE SURG SS PI 6.5 STRL IVOR (GLOVE) ×1 IMPLANT
GLOVE SURG UNDER POLY LF SZ8 (GLOVE) ×2
GOWN STRL REUS W/TWL LRG LVL3 (GOWN DISPOSABLE) ×8 IMPLANT
GOWN STRL REUS W/TWL XL LVL3 (GOWN DISPOSABLE) ×3 IMPLANT
INST SET MAJOR GENERAL (KITS) ×3 IMPLANT
KIT BLADEGUARD II DBL (SET/KITS/TRAYS/PACK) ×3 IMPLANT
KIT TURNOVER KIT A (KITS) ×3 IMPLANT
MANIFOLD NEPTUNE II (INSTRUMENTS) ×3 IMPLANT
NDL HYPO 18GX1.5 BLUNT FILL (NEEDLE) ×2 IMPLANT
NDL HYPO 21X1.5 SAFETY (NEEDLE) ×2 IMPLANT
NEEDLE HYPO 18GX1.5 BLUNT FILL (NEEDLE) ×2 IMPLANT
NEEDLE HYPO 21X1.5 SAFETY (NEEDLE) ×2 IMPLANT
NS IRRIG 1000ML POUR BTL (IV SOLUTION) ×6 IMPLANT
PACK MAJOR ABDOMINAL (CUSTOM PROCEDURE TRAY) ×3 IMPLANT
PAD ARMBOARD 7.5X6 YLW CONV (MISCELLANEOUS) ×3 IMPLANT
POWDER SURGICEL 3.0 GRAM (HEMOSTASIS) ×1 IMPLANT
RETRACTOR WOUND ALXS 18CM MED (MISCELLANEOUS) IMPLANT
RTRCTR WOUND ALEXIS O 18CM MED (MISCELLANEOUS) ×2
SET BASIN LINEN APH (SET/KITS/TRAYS/PACK) ×3 IMPLANT
SHEARS HARMONIC 9CM CVD (BLADE) ×1 IMPLANT
SPONGE T-LAP 18X18 ~~LOC~~+RFID (SPONGE) ×7 IMPLANT
SUT CHROMIC 0 CT 1 (SUTURE) ×3 IMPLANT
SUT MON AB 3-0 SH 27 (SUTURE) ×6 IMPLANT
SUT VIC AB 0 CT1 27 (SUTURE) ×6
SUT VIC AB 0 CT1 27XCR 8 STRN (SUTURE) ×6 IMPLANT
SUT VIC AB 0 CTX 36 (SUTURE) ×2
SUT VIC AB 0 CTX36XBRD ANTBCTR (SUTURE) ×2 IMPLANT
SUT VICRYL 3 0 (SUTURE) ×3 IMPLANT
SYR 20ML LL LF (SYRINGE) ×6 IMPLANT
TOWEL ~~LOC~~+RFID 17X26 BLUE (SPONGE) ×3 IMPLANT
TRAY FOLEY W/BAG SLVR 16FR (SET/KITS/TRAYS/PACK) ×2
TRAY FOLEY W/BAG SLVR 16FR ST (SET/KITS/TRAYS/PACK) IMPLANT

## 2022-01-09 NOTE — Telephone Encounter (Signed)
Please call [facility] [phone] The patients name is Natalie Cordova MRN [id number]

## 2022-01-09 NOTE — Discharge Instructions (Signed)
Post Operative Pain Med Plan:  >Take gabapentin 300 mg three times per day, as prescribed for 4 days, try to space them evenly  >Take the oxycodone 1 tablet, on a schedule, around the clock, every 6 hours(set your phone alarm) for the first 2 days, there may     Be times when you will need 2 tablets but taking them on a schedule will decrease this need  >Then take the oxycodone on an as needed basis per the prescription instructions  >Take the ketorolac or toradol every 8 hours for the first 3 days then the remainder to supplement the oxycodone  >After the toradol is gone then use the ibuprofen as ordered as needed to help supplement the oxycodone  >Use a heating pad as well as needed  >Of course the zofran is available to take as prescribed when needed for nausea  >Be gentle with your diet the first few days, liquids and soft non spicy food, fruits are great  >Get up and move, no lifting or straining  Dr Elonda Husky

## 2022-01-09 NOTE — Transfer of Care (Signed)
Immediate Anesthesia Transfer of Care Note  Patient: Natalie Cordova  Procedure(s) Performed: HYSTERECTOMY SUPRACERVICAL ABDOMINAL (Abdomen)  Patient Location: PACU  Anesthesia Type:General  Level of Consciousness: awake  Airway & Oxygen Therapy: Patient Spontanous Breathing  Post-op Assessment: Report given to RN  Post vital signs: Reviewed and stable  Last Vitals:  Vitals Value Taken Time  BP 132/94 01/09/22 1401  Temp    Pulse 75 01/09/22 1403  Resp 21 01/09/22 1403  SpO2 96 % 01/09/22 1403  Vitals shown include unvalidated device data.  Last Pain:  Vitals:   01/09/22 1007  TempSrc: Oral  PainSc: 0-No pain      Patients Stated Pain Goal: 4 (82/50/53 [id number])  Complications: No notable events documented.

## 2022-01-09 NOTE — Anesthesia Preprocedure Evaluation (Signed)
Anesthesia Evaluation  Patient identified by MRN, date of birth, ID band Patient awake    Reviewed: Allergy & Precautions, H&P , NPO status , Patient's Chart, lab work & pertinent test results, reviewed documented beta blocker date and time   Airway Mallampati: I  TM Distance: >3 FB Neck ROM: full    Dental no notable dental hx.    Pulmonary asthma ,    Pulmonary exam normal breath sounds clear to auscultation       Cardiovascular Exercise Tolerance: Good hypertension, negative cardio ROS   Rhythm:regular Rate:Normal     Neuro/Psych  Headaches, PSYCHIATRIC DISORDERS Anxiety Depression    GI/Hepatic negative GI ROS, Neg liver ROS,   Endo/Other  Morbid obesity  Renal/GU negative Renal ROS  negative genitourinary   Musculoskeletal   Abdominal   Peds  Hematology negative hematology ROS (+)   Anesthesia Other Findings   Reproductive/Obstetrics negative OB ROS                             Anesthesia Physical Anesthesia Plan  ASA: 3  Anesthesia Plan: General and General ETT   Post-op Pain Management:    Induction:   PONV Risk Score and Plan: Ondansetron  Airway Management Planned:   Additional Equipment:   Intra-op Plan:   Post-operative Plan:   Informed Consent: I have reviewed the patients History and Physical, chart, labs and discussed the procedure including the risks, benefits and alternatives for the proposed anesthesia with the patient or authorized representative who has indicated his/her understanding and acceptance.     Dental Advisory Given  Plan Discussed with: CRNA  Anesthesia Plan Comments:         Anesthesia Quick Evaluation

## 2022-01-09 NOTE — H&P (Signed)
Preoperative History and Physical  Natalie Cordova is a 46 y.o. G1P1001 with No LMP recorded. admitted for a abdominal supracervical hysterectomy.  Pt is s/p endometrial ablation 2019 Began having increased amount of pain with menses and now menses becoming heavier Sonogram reveals a few small fibroids, submucosal No dyspareunia No response to megestrol or micronor    PMH:    Past Medical History:  Diagnosis Date   Anxiety    Asthma    Contraceptive management 03/07/2014   Depression    Essential hypertension    related to anxiety and stress per patient   Fibroids 07/22/2017   Heart palpitations    Migraine headache    Osteoarthritis of both knees     PSH:     Past Surgical History:  Procedure Laterality Date   HYSTEROSCOPY N/A 11/07/2017   Procedure: HYSTEROSCOPY WITH HYDROTHERMAL ABLATION;  Surgeon: Jonnie Kind, MD;  Location: [facility];  Service: Gynecology;  Laterality: N/A;   LAPAROSCOPIC BILATERAL SALPINGECTOMY Bilateral 11/07/2017   Procedure: LAPAROSCOPIC BILATERAL SALPINGECTOMY;  Surgeon: Jonnie Kind, MD;  Location: [facility];  Service: Gynecology;  Laterality: Bilateral;   LEEP  1995   WISDOM TOOTH EXTRACTION      POb/GynH:      OB History     Gravida  1   Para  1   Term  1   Preterm      AB      Living  1      SAB      IAB      Ectopic      Multiple      Live Births  1           SH:   Social History   Tobacco Use   Smoking status: Never   Smokeless tobacco: Never  Vaping Use   Vaping Use: Never used  Substance Use Topics   Alcohol use: Yes    Comment: occ beer or wine   Drug use: No    FH:    Family History  Problem Relation Age of Onset   Breast cancer Mother    Healthy Mother    Healthy Father    Breast cancer Maternal Aunt    Breast cancer Maternal Aunt    Cancer Maternal Grandfather        lung   Healthy Brother      Allergies: No Known Allergies  Medications:       Current Facility-Administered  Medications:    bupivacaine-meloxicam ER (ZYNRELEF) injection 200 mg, 200 mg, Infiltration, Once, Florian Buff, MD   ceFAZolin (ANCEF) 2-4 GM/100ML-% IVPB, , , ,    ceFAZolin (ANCEF) IVPB 2g/100 mL premix, 2 g, Intravenous, On Call to OR, Florian Buff, MD   povidone-iodine 10 % swab 2 Application, 2 Application, Topical, Once, Florian Buff, MD  Review of Systems:   Review of Systems  Constitutional: Negative for fever, chills, weight loss, malaise/fatigue and diaphoresis.  HENT: Negative for hearing loss, ear pain, nosebleeds, congestion, sore throat, neck pain, tinnitus and ear discharge.   Eyes: Negative for blurred vision, double vision, photophobia, pain, discharge and redness.  Respiratory: Negative for cough, hemoptysis, sputum production, shortness of breath, wheezing and stridor.   Cardiovascular: Negative for chest pain, palpitations, orthopnea, claudication, leg swelling and PND.  Gastrointestinal: Positive for abdominal pain. Negative for heartburn, nausea, vomiting, diarrhea, constipation, blood in stool and melena.  Genitourinary: Negative for dysuria, urgency, frequency, hematuria and flank pain.  Musculoskeletal: Negative for myalgias, back pain, joint pain and falls.  Skin: Negative for itching and rash.  Neurological: Negative for dizziness, tingling, tremors, sensory change, speech change, focal weakness, seizures, loss of consciousness, weakness and headaches.  Endo/Heme/Allergies: Negative for environmental allergies and polydipsia. Does not bruise/bleed easily.  Psychiatric/Behavioral: Negative for depression, suicidal ideas, hallucinations, memory loss and substance abuse. The patient is not nervous/anxious and does not have insomnia.      PHYSICAL EXAM:  Blood pressure 125/78, pulse 67, temperature 97.7 F (36.5 C), temperature source Oral, resp. rate 17, height 5' 5.5" (1.664 m), weight 111.1 kg, SpO2 100 %.    Vitals reviewed. Constitutional: She is  oriented to person, place, and time. She appears well-developed and well-nourished.  HENT:  Head: Normocephalic and atraumatic.  Right Ear: External ear normal.  Left Ear: External ear normal.  Nose: Nose normal.  Mouth/Throat: Oropharynx is clear and moist.  Eyes: Conjunctivae and EOM are normal. Pupils are equal, round, and reactive to light. Right eye exhibits no discharge. Left eye exhibits no discharge. No scleral icterus.  Neck: Normal range of motion. Neck supple. No tracheal deviation present. No thyromegaly present.  Cardiovascular: Normal rate, regular rhythm, normal heart sounds and intact distal pulses.  Exam reveals no gallop and no friction rub.   No murmur heard. Respiratory: Effort normal and breath sounds normal. No respiratory distress. She has no wheezes. She has no rales. She exhibits no tenderness.  GI: Soft. Bowel sounds are normal. She exhibits no distension and no mass. There is tenderness. There is no rebound and no guarding.  Genitourinary:       Vulva is normal without lesions Vagina is pink moist without discharge Cervix normal in appearance and pap is normal Uterus is normal size, contour, position, consistency, mobility, non-tender, no descent on exam Adnexa is negative with normal sized ovaries by sonogram  Musculoskeletal: Normal range of motion. She exhibits no edema and no tenderness.  Neurological: She is alert and oriented to person, place, and time. She has normal reflexes. She displays normal reflexes. No cranial nerve deficit. She exhibits normal muscle tone. Coordination normal.  Skin: Skin is warm and dry. No rash noted. No erythema. No pallor.  Psychiatric: She has a normal mood and affect. Her behavior is normal. Judgment and thought content normal.    Labs: Results for orders placed or performed during the hospital encounter of 01/07/22 (from the past 336 hour(s))  Surgical pcr screen   Collection Time: 01/07/22  8:25 AM   Specimen: Nasal  Mucosa; Nasal Swab  Result Value Ref Range   MRSA, PCR NEGATIVE NEGATIVE   Staphylococcus aureus NEGATIVE NEGATIVE  CBC with Differential/Platelet   Collection Time: 01/07/22  8:25 AM  Result Value Ref Range   WBC 5.7 4.0 - 10.5 K/uL   RBC 4.80 3.87 - 5.11 MIL/uL   Hemoglobin 13.7 12.0 - 15.0 g/dL   HCT 42.6 36.0 - 46.0 %   MCV 88.8 80.0 - 100.0 fL   MCH 28.5 26.0 - 34.0 pg   MCHC 32.2 30.0 - 36.0 g/dL   RDW 14.2 11.5 - 15.5 %   Platelets 318 150 - 400 K/uL   nRBC 0.0 0.0 - 0.2 %   Neutrophils Relative % 39 %   Neutro Abs 2.2 1.7 - 7.7 K/uL   Lymphocytes Relative 48 %   Lymphs Abs 2.8 0.7 - 4.0 K/uL   Monocytes Relative 8 %   Monocytes Absolute 0.4 0.1 - 1.0 K/uL  Eosinophils Relative 4 %   Eosinophils Absolute 0.2 0.0 - 0.5 K/uL   Basophils Relative 1 %   Basophils Absolute 0.0 0.0 - 0.1 K/uL   Immature Granulocytes 0 %   Abs Immature Granulocytes 0.02 0.00 - 0.07 K/uL  Comprehensive metabolic panel   Collection Time: 01/07/22  8:25 AM  Result Value Ref Range   Sodium 138 135 - 145 mmol/L   Potassium 3.6 3.5 - 5.1 mmol/L   Chloride 106 98 - 111 mmol/L   CO2 23 22 - 32 mmol/L   Glucose, Bld 96 70 - 99 mg/dL   BUN 13 6 - 20 mg/dL   Creatinine, Ser 0.75 0.44 - 1.00 mg/dL   Calcium 9.0 8.9 - 10.3 mg/dL   Total Protein 7.6 6.5 - 8.1 g/dL   Albumin 3.9 3.5 - 5.0 g/dL   AST 14 (L) 15 - 41 U/L   ALT 16 0 - 44 U/L   Alkaline Phosphatase 55 38 - 126 U/L   Total Bilirubin 0.6 0.3 - 1.2 mg/dL   GFR, Estimated >60 >60 mL/min   Anion gap 9 5 - 15  Rapid HIV screen (HIV 1/2 Ab+Ag)   Collection Time: 01/07/22  8:25 AM  Result Value Ref Range   HIV-1 P24 Antigen - HIV24 NON REACTIVE NON REACTIVE   HIV 1/2 Antibodies NON REACTIVE NON REACTIVE   Interpretation (HIV Ag Ab)      A non reactive test result means that HIV 1 or HIV 2 antibodies and HIV 1 p24 antigen were not detected in the specimen.  Urinalysis, Routine w reflex microscopic   Collection Time: 01/07/22  8:25 AM   Result Value Ref Range   Color, Urine YELLOW YELLOW   APPearance CLEAR CLEAR   Specific Gravity, Urine 1.017 1.005 - 1.030   pH 7.0 5.0 - 8.0   Glucose, UA NEGATIVE NEGATIVE mg/dL   Hgb urine dipstick NEGATIVE NEGATIVE   Bilirubin Urine NEGATIVE NEGATIVE   Ketones, ur NEGATIVE NEGATIVE mg/dL   Protein, ur NEGATIVE NEGATIVE mg/dL   Nitrite NEGATIVE NEGATIVE   Leukocytes,Ua NEGATIVE NEGATIVE  Type and screen   Collection Time: 01/07/22  8:25 AM  Result Value Ref Range   ABO/RH(D) O POS    Antibody Screen NEG    Sample Expiration 01/21/2022,2359    Extend sample reason      NO TRANSFUSIONS OR PREGNANCY IN THE PAST 3 MONTHS Performed at [facility], [address], [place], [place] [address]   Pregnancy, urine POC   Collection Time: 01/07/22  8:44 AM  Result Value Ref Range   Preg Test, Ur NEGATIVE NEGATIVE    EKG: Orders placed or performed in visit on 05/25/21   EKG 12-Lead    Imaging Studies: No results found.    Assessment: 1. Dysmenorrhea, chronic/worsening  N94.6      Plan abdominal supracervical hysterectomy 01/02/22 [facility]     2. Submucous uterine fibroid  D25.0       3. Menorrhagia with regular cycle       Plan: Abdominal supracervical hysterectomy  Pt understands the risks of surgery including but not limited t  excessive bleeding requiring transfusion or reoperation, post-operative infection requiring prolonged hospitalization or re-hospitalization and antibiotic therapy, and damage to other organs including bladder, bowel, ureters and major vessels.  The patient also understands the alternative treatment options which were discussed in full.  All questions were answered.  Florian Buff 01/09/2022 10:48 AM   Florian Buff 01/09/2022 10:44 AM

## 2022-01-09 NOTE — Op Note (Signed)
Preoperative diagnosis:  1.  Dysmenorrhea                                          2.  menorrhagia                                         3.  Submucosal myoma                                         4.  S/P endometrial ablation 2019  Postoperative diagnosis:  Same as above  Procedure:  Abdominal hysterectomy, supracervical  Surgeon:  Florian Buff  Assistant:    Anesthesia:  General endotracheal  Intraoperative findings: several fibroids, both ovaries were normal no abnormalities noted  Description of operation:  Patient was taken to the operating room and placed in the supine position where she underwent general endotracheal anesthesia.  She was then prepped and draped in the usual sterile fashion and a Foley catheter was placed for continuous bladder drainage.  A 6 cm low transverse skin incision was made and carried down sharply to the rectus fascia which was scored in the midline and extended laterally.  The fascia was taken off the muscles superiorly and inferiorly without difficulty.  The muscles were divided.  The peritoneal cavity was entered.  An medium Alexis self-retaining retractor was placed.  The upper abdomen was packed away. Both uterine cornu were grasped with Coker clamps.  The left round ligament was suture ligated and coagulated with the electrocautery unit.  The left vesicouterine serosal flap was created.  An avascular window in in the peritoneum was created and the utero-ovarian ligament was cross clamped, cut and suture ligated.  The right round ligament was suture ligated and cut with the electrocautery unit.  The vesicouterine serosal flap on the right was created.  An avascular window in the peritoneum was created and the right utero-ovarian ligament was cross clamped, cut and double suture ligated.  Thus both ovaries were preserved. Bovie and Harmonic scalpel were used to morcellate and amputate the uterus. The uterine vessels were skeletonized bilaterally.  The uterine  vessels were clamped bilaterally,  then cut and suture ligated.  Two more pedicles were taken down the cervix medial to the uterine vessels.  Each pedicle was clamped cut and suture ligated with good resulting hemostasis.  As per the preoperative plan the cervix was then transected sharply and the specimen was removed.  The cervical stump was then closed anterior to posterior for hemostasis and reduce postoperative adhesions.  The pelvis was irrigated vigorously and all pedicles were examined and found to be hemostatic.   All specimens were sent to pathology for routine evaluation.  The Alexis self-retaining retractor was removed and the pelvis was irrigated vigorously.  All packs were removed and all counts were correct at this point x 3.  The muscles and peritoneum were reapproximated loosely.  The fascia was closed with 0 Vicryl running. Zynrelef was placed under and on top of the rectus muscles. The skin was closed using 3-0 Vicryl on a Keith needle in a subcuticular fashion.  Dermabond was then applied for additional wound integrity and to serve as a postoperative bacterial  barrier.  The patient was awakened from anesthesia taken to the recovery room in good stable condition. All sponge instrument and needle counts were correct x 3.  The patient received Ancef and Toradol prophylactically preoperatively.  Estimated blood loss for the procedure was 200  cc.  Florian Buff, MD  01/09/2022 2:03 PM

## 2022-01-09 NOTE — Telephone Encounter (Signed)
Pharmacy needing to verify that patient received IM dose of Toradol.  Informed patient did have medication as she did have surgery today.

## 2022-01-09 NOTE — Anesthesia Procedure Notes (Addendum)
Procedure Name: Intubation Date/Time: 01/09/2022 11:35 AM  Performed by: Ollen Bowl, CRNAPre-anesthesia Checklist: Patient identified, Patient being monitored, Timeout performed, Emergency Drugs available and Suction available Patient Re-evaluated:Patient Re-evaluated prior to induction Oxygen Delivery Method: Circle system utilized Preoxygenation: Pre-oxygenation with 100% oxygen Induction Type: IV induction Ventilation: Mask ventilation without difficulty Laryngoscope Size: Mac and 3 Grade View: Grade I Tube type: Oral Tube size: 7.0 mm Number of attempts: 1 Airway Equipment and Method: Stylet Placement Confirmation: ETT inserted through vocal cords under direct vision, positive ETCO2 and breath sounds checked- equal and bilateral Secured at: 21 cm Tube secured with: Tape Dental Injury: Teeth and Oropharynx as per pre-operative assessment

## 2022-01-10 ENCOUNTER — Encounter: Payer: 59 | Admitting: Obstetrics & Gynecology

## 2022-01-11 ENCOUNTER — Encounter (HOSPITAL_COMMUNITY): Payer: Self-pay | Admitting: Obstetrics & Gynecology

## 2022-01-11 LAB — SURGICAL PATHOLOGY

## 2022-01-11 NOTE — Anesthesia Postprocedure Evaluation (Signed)
Anesthesia Post Note  Patient: Natalie Cordova  Procedure(s) Performed: HYSTERECTOMY SUPRACERVICAL ABDOMINAL (Abdomen)  Patient location during evaluation: Phase II Anesthesia Type: General Level of consciousness: awake Pain management: pain level controlled Vital Signs Assessment: post-procedure vital signs reviewed and stable Respiratory status: spontaneous breathing and respiratory function stable Cardiovascular status: blood pressure returned to baseline and stable Postop Assessment: no headache and no apparent nausea or vomiting Anesthetic complications: no Comments: Late entry   No notable events documented.   Last Vitals:  Vitals:   01/09/22 1450 01/09/22 1458  BP:  129/78  Pulse: 66   Resp: 18   Temp: 36.7 C   SpO2: 93%     Last Pain:  Vitals:   01/10/22 1113  TempSrc:   PainSc: [name]

## 2022-01-14 ENCOUNTER — Ambulatory Visit: Payer: 59 | Admitting: Gastroenterology

## 2022-01-17 ENCOUNTER — Other Ambulatory Visit (INDEPENDENT_AMBULATORY_CARE_PROVIDER_SITE_OTHER): Payer: Self-pay | Admitting: Obstetrics & Gynecology

## 2022-01-21 ENCOUNTER — Ambulatory Visit (INDEPENDENT_AMBULATORY_CARE_PROVIDER_SITE_OTHER): Payer: 59 | Admitting: Obstetrics & Gynecology

## 2022-01-21 ENCOUNTER — Encounter: Payer: Self-pay | Admitting: Obstetrics & Gynecology

## 2022-01-21 VITALS — BP 131/84 | HR 74 | Ht 65.5 in | Wt 248.0 lb

## 2022-01-21 DIAGNOSIS — Z9889 Other specified postprocedural states: Secondary | ICD-10-CM

## 2022-01-21 MED ORDER — OXYCODONE-ACETAMINOPHEN 5-325 MG PO TABS: 1.0000 | ORAL_TABLET | ORAL | 0 refills | Status: DC | PRN

## 2022-01-21 NOTE — Progress Notes (Signed)
  HPI: Patient returns for routine postoperative follow-up having undergone supracervical hysterectomy on 01/09/22.  The patient's immediate postoperative recovery has been unremarkable. Since hospital discharge the patient reports constipation   Current Outpatient Medications: Bacillus Coagulans-Inulin (PROBIOTIC-PREBIOTIC PO), Take 1 tablet by mouth daily., Disp: , Rfl:  cetirizine-pseudoephedrine (ZYRTEC-D) 5-120 MG tablet, Take 1 tablet by mouth daily as needed for allergies., Disp: , Rfl:  clonazePAM (KLONOPIN) 1 MG tablet, Take 1 mg by mouth daily as needed for anxiety (sleep)., Disp: , Rfl:  ENULOSE 10 GM/15ML SOLN, Take 15 mLs by mouth daily as needed for constipation., Disp: , Rfl:  hydrochlorothiazide (HYDRODIURIL) 25 MG tablet, Take 25 mg by mouth daily., Disp: , Rfl:  ketorolac (TORADOL) 10 MG tablet, TAKE 1 TABLET BY MOUTH EVERY 8 HOURS AS NEEDED., Disp: 15 tablet, Rfl: 0 linaclotide (LINZESS) 290 MCG CAPS capsule, Take 290 mcg by mouth daily as needed (constipation)., Disp: , Rfl:  metoprolol succinate (TOPROL-XL) 50 MG 24 hr tablet, Take 1 tablet (50 mg total) by mouth at bedtime., Disp: 90 tablet, Rfl: 3 Multiple Vitamins-Minerals (MULTIVITAMIN WITH MINERALS) tablet, Take 1 tablet by mouth 2 (two) times daily. gummy, Disp: , Rfl:  oxyCODONE-acetaminophen (PERCOCET) 5-325 MG tablet, Take 1-2 tablets by mouth every 4 (four) hours as needed for severe pain., Disp: 30 tablet, Rfl: 0 PROAIR HFA 108 (90 Base) MCG/ACT inhaler, Inhale 2 puffs into the lungs every 4 (four) hours as needed for wheezing or shortness of breath. , Disp: , Rfl:  SUMAtriptan (IMITREX) 100 MG tablet, Take 100 mg by mouth every 2 (two) hours as needed for migraine., Disp: , Rfl:  gabapentin (NEURONTIN) 300 MG capsule, Take 1 capsule (300 mg total) by mouth 3 (three) times daily. (Patient not taking: Reported on 01/21/2022), Disp: 12 capsule, Rfl: 0 metroNIDAZOLE (METROGEL) 0.75 % vaginal gel, PLACE 1 APPLICATORFUL  VAGINALLY AT BEDTIME. (Patient not taking: Reported on 11/06/2021), Disp: 70 g, Rfl: 0 ondansetron (ZOFRAN-ODT) 8 MG disintegrating tablet, Take 1 tablet (8 mg total) by mouth every 8 (eight) hours as needed for nausea or vomiting. (Patient not taking: Reported on 01/21/2022), Disp: 20 tablet, Rfl: 0 promethazine (PHENERGAN) 25 MG tablet, Take 1 tablet (25 mg total) by mouth every 6 (six) hours as needed for nausea. (Patient not taking: Reported on 01/21/2022), Disp: 30 tablet, Rfl: 1  No current facility-administered medications for this visit.    Blood pressure 131/84, pulse 74, height 5' 5.5" (1.664 m), weight 248 lb (112.5 kg), last menstrual period 10/26/2021.  Physical Exam: Incision clean dry intact Abdomen is soft and non tender  Diagnostic Tests:   Pathology: benign  Impression + Management plan:   ICD-10-CM   1. S/P supracervical hysterectomy 01/09/22:  Post-operative state  Z98.890    no post op issues         Medications Prescribed this encounter: No orders of the defined types were placed in this encounter.     Follow up:  Return in about 4 weeks (around 02/18/2022) for Follow up, with Dr Elonda Husky.   Florian Buff, MD Attending Physician for the [facility] 01/21/2022 11:34 AM

## 2022-02-12 NOTE — Progress Notes (Unsigned)
Referring Provider: Dr. Karie Kirks Primary Care Physician:  Carlis Abbott, NP Primary Gastroenterologist:  Dr. Gala Romney  No chief complaint on file.   HPI:   Natalie Cordova is a 46 y.o. female presenting today at the request of Dr. Karie Kirks for consult colonoscopy and constipation.    Past Medical History:  Diagnosis Date   Anxiety    Asthma    Contraceptive management 03/07/2014   Depression    Essential hypertension    related to anxiety and stress per patient   Fibroids 07/22/2017   Heart palpitations    Migraine headache    Osteoarthritis of both knees     Past Surgical History:  Procedure Laterality Date   HYSTEROSCOPY N/A 11/07/2017   Procedure: HYSTEROSCOPY WITH HYDROTHERMAL ABLATION;  Surgeon: Jonnie Kind, MD;  Location: [facility];  Service: Gynecology;  Laterality: N/A;   LAPAROSCOPIC BILATERAL SALPINGECTOMY Bilateral 11/07/2017   Procedure: LAPAROSCOPIC BILATERAL SALPINGECTOMY;  Surgeon: Jonnie Kind, MD;  Location: [facility];  Service: Gynecology;  Laterality: Bilateral;   LEEP  1995   SUPRACERVICAL ABDOMINAL HYSTERECTOMY N/A 01/09/2022   Procedure: HYSTERECTOMY SUPRACERVICAL ABDOMINAL;  Surgeon: Florian Buff, MD;  Location: [facility];  Service: Gynecology;  Laterality: N/A;   WISDOM TOOTH EXTRACTION      Current Outpatient Medications  Medication Sig Dispense Refill   Bacillus Coagulans-Inulin (PROBIOTIC-PREBIOTIC PO) Take 1 tablet by mouth daily.     cetirizine-pseudoephedrine (ZYRTEC-D) 5-120 MG tablet Take 1 tablet by mouth daily as needed for allergies.     clonazePAM (KLONOPIN) 1 MG tablet Take 1 mg by mouth daily as needed for anxiety (sleep).     ENULOSE 10 GM/15ML SOLN Take 15 mLs by mouth daily as needed for constipation.     gabapentin (NEURONTIN) 300 MG capsule Take 1 capsule (300 mg total) by mouth 3 (three) times daily. (Patient not taking: Reported on 01/21/2022) 12 capsule 0   hydrochlorothiazide (HYDRODIURIL) 25 MG tablet Take 25 mg by mouth  daily.     ketorolac (TORADOL) 10 MG tablet TAKE 1 TABLET BY MOUTH EVERY 8 HOURS AS NEEDED. 15 tablet 0   linaclotide (LINZESS) 290 MCG CAPS capsule Take 290 mcg by mouth daily as needed (constipation).     metoprolol succinate (TOPROL-XL) 50 MG 24 hr tablet Take 1 tablet (50 mg total) by mouth at bedtime. 90 tablet 3   metroNIDAZOLE (METROGEL) 0.75 % vaginal gel PLACE 1 APPLICATORFUL VAGINALLY AT BEDTIME. (Patient not taking: Reported on 11/06/2021) 70 g 0   Multiple Vitamins-Minerals (MULTIVITAMIN WITH MINERALS) tablet Take 1 tablet by mouth 2 (two) times daily. gummy     ondansetron (ZOFRAN-ODT) 8 MG disintegrating tablet Take 1 tablet (8 mg total) by mouth every 8 (eight) hours as needed for nausea or vomiting. (Patient not taking: Reported on 01/21/2022) 20 tablet 0   oxyCODONE-acetaminophen (PERCOCET) 5-325 MG tablet Take 1-2 tablets by mouth every 4 (four) hours as needed for severe pain. 30 tablet 0   oxyCODONE-acetaminophen (PERCOCET/ROXICET) 5-325 MG tablet Take 1 tablet by mouth every 4 (four) hours as needed for severe pain. 20 tablet 0   PROAIR HFA 108 (90 Base) MCG/ACT inhaler Inhale 2 puffs into the lungs every 4 (four) hours as needed for wheezing or shortness of breath.      promethazine (PHENERGAN) 25 MG tablet Take 1 tablet (25 mg total) by mouth every 6 (six) hours as needed for nausea. (Patient not taking: Reported on 01/21/2022) 30 tablet 1   SUMAtriptan (IMITREX) 100 MG  tablet Take 100 mg by mouth every 2 (two) hours as needed for migraine.     No current facility-administered medications for this visit.    Allergies as of 02/14/2022   (No Known Allergies)    Family History  Problem Relation Age of Onset   Breast cancer Mother    Healthy Mother    Healthy Father    Breast cancer Maternal Aunt    Breast cancer Maternal Aunt    Cancer Maternal Grandfather        lung   Healthy Brother     Social History   Socioeconomic History   Marital status: Married    Spouse  name: Not on file   Number of children: 1   Years of education: 12   Highest education level: Not on file  Occupational History   Not on file  Tobacco Use   Smoking status: Never   Smokeless tobacco: Never  Vaping Use   Vaping Use: Never used  Substance and Sexual Activity   Alcohol use: Yes    Comment: occ beer or wine   Drug use: No   Sexual activity: Yes    Birth control/protection: Surgical    Comment: tubal and ablation  Other Topics Concern   Not on file  Social History Narrative   She has been married for 3-1/2 years. She lives with her husband and daughter (29 year old). She has a high school education, but is not currently working. She does not exercise because of lack of motivation. She does not drink alcohol or smoke.   Social Determinants of Health   Financial Resource Strain: Not on file  Food Insecurity: Not on file  Transportation Needs: Not on file  Physical Activity: Not on file  Stress: Not on file  Social Connections: Not on file  Intimate Partner Violence: Not on file    Review of Systems: Gen: Denies any fever, chills, cold or flulike symptoms, presyncope, syncope. CV: Denies chest pain, heart palpitations. Resp: Denies shortness of breath, cough. GI: See HPI GU : Denies urinary burning, urinary frequency, urinary hesitancy MS: Denies joint pain. Derm: Denies rash. Psych: Denies depression, anxiety. Heme: See HPI  Physical Exam: LMP 10/26/2021  General:   Alert and oriented. Pleasant and cooperative. Well-nourished and well-developed.  Head:  Normocephalic and atraumatic. Eyes:  Without icterus, sclera clear and conjunctiva pink.  Ears:  Normal auditory acuity. Lungs:  Clear to auscultation bilaterally. No wheezes, rales, or rhonchi. No distress.  Heart:  S1, S2 present without murmurs appreciated.  Abdomen:  +BS, soft, non-tender and non-distended. No HSM noted. No guarding or rebound. No masses appreciated.  Rectal:  Deferred  Msk:   Symmetrical without gross deformities. Normal posture. Extremities:  Without edema. Neurologic:  Alert and  oriented x4;  grossly normal neurologically. Skin:  Intact without significant lesions or rashes. Psych:  Normal mood and affect.    Assessment:     Plan:  ***   Aliene Altes, PA-C [facility] 02/14/2022

## 2022-02-14 ENCOUNTER — Encounter: Payer: Self-pay | Admitting: Gastroenterology

## 2022-02-14 ENCOUNTER — Ambulatory Visit (INDEPENDENT_AMBULATORY_CARE_PROVIDER_SITE_OTHER): Payer: 59 | Admitting: Gastroenterology

## 2022-02-14 VITALS — BP 131/79 | HR 82 | Temp 97.9°F | Ht 65.0 in | Wt 247.4 lb

## 2022-02-14 DIAGNOSIS — K5900 Constipation, unspecified: Secondary | ICD-10-CM

## 2022-02-14 DIAGNOSIS — Z6841 Body Mass Index (BMI) 40.0 and over, adult: Secondary | ICD-10-CM | POA: Diagnosis not present

## 2022-02-14 DIAGNOSIS — Z1211 Encounter for screening for malignant neoplasm of colon: Secondary | ICD-10-CM | POA: Diagnosis not present

## 2022-02-14 NOTE — Patient Instructions (Signed)
We will arrange for you to have a colonoscopy in the near future with Dr. Gala Romney. You will take Linzess 145 mcg daily for 4 days prior to starting your colon prep to ensure that you are cleaned out well for your procedure.  For daily management of constipation, start MiraLAX 1 capful (17 g) daily in 8 ounces of water.  You may increase to twice daily if needed. Drink at least 64 ounces of water daily. Consume plenty of fruits and vegetables on a regular basis to maintain adequate fiber intake. Try to get 15-30 minutes of exercise daily.  We are placing a referral to healthy weight and wellness.  They should reach out to you to schedule an appointment.  We will follow-up with you in the office as needed.  Do not hesitate to call if you have any GI concerns.  It was very nice to meet you today!  I hope you have a great rest of your summer!  Aliene Altes, PA-C [facility]

## 2022-02-15 ENCOUNTER — Ambulatory Visit: Payer: 59 | Admitting: Obstetrics & Gynecology

## 2022-02-15 ENCOUNTER — Encounter: Payer: Self-pay | Admitting: Obstetrics & Gynecology

## 2022-02-15 VITALS — BP 134/89 | HR 85 | Ht 65.5 in | Wt 246.5 lb

## 2022-02-15 DIAGNOSIS — Z9889 Other specified postprocedural states: Secondary | ICD-10-CM

## 2022-02-15 NOTE — Progress Notes (Signed)
  HPI: Patient returns for routine postoperative follow-up having undergone supracervical hysterectomy on 6/28.  The patient's immediate postoperative recovery has been unremarkable. Since hospital discharge the patient reports 3 days of spotting, ? Endocervical origin .   Current Outpatient Medications: hydrochlorothiazide (HYDRODIURIL) 25 MG tablet, Take 25 mg by mouth daily., Disp: , Rfl:  ibuprofen (ADVIL) 800 MG tablet, Take 800 mg by mouth every 8 (eight) hours as needed., Disp: , Rfl:  metoprolol succinate (TOPROL-XL) 50 MG 24 hr tablet, Take 1 tablet (50 mg total) by mouth at bedtime., Disp: 90 tablet, Rfl: 3 PROAIR HFA 108 (90 Base) MCG/ACT inhaler, Inhale 2 puffs into the lungs every 4 (four) hours as needed for wheezing or shortness of breath. , Disp: , Rfl:  SUMAtriptan (IMITREX) 100 MG tablet, Take 100 mg by mouth every 2 (two) hours as needed for migraine., Disp: , Rfl:  clonazePAM (KLONOPIN) 1 MG tablet, Take 1 mg by mouth daily as needed for anxiety (sleep). (Patient not taking: Reported on 02/14/2022), Disp: , Rfl:   No current facility-administered medications for this visit.    Blood pressure 134/89, pulse 85, height 5' 5.5" (1.664 m), weight 246 lb 8 oz (111.8 kg), last menstrual period 10/26/2021.  Physical Exam: Normal post op exam +yeast vaginitis  Diagnostic Tests:   Pathology: benign  Impression + Management plan:   ICD-10-CM   1. S/P supracervical hysterectomy 01/09/22:  Post-operative state  Z98.890          Medications Prescribed this encounter: No orders of the defined types were placed in this encounter.     Follow up: Prn 3 years    Florian Buff, MD Attending Physician for the [facility] 02/15/2022 11:43 AM

## 2022-02-19 ENCOUNTER — Encounter: Payer: Self-pay | Admitting: *Deleted

## 2022-02-19 ENCOUNTER — Telehealth: Payer: Self-pay | Admitting: *Deleted

## 2022-02-19 MED ORDER — PEG 3350-KCL-NA BICARB-NACL 420 G PO SOLR: 4000.0000 mL | Freq: Once | ORAL | 0 refills | Status: AC

## 2022-02-19 NOTE — Telephone Encounter (Signed)
Spoke with pt. She has been scheduled for TCS with Dr. Gala Romney, ASA 3 on 9/20 at 7:30am. Aware will mail prep instructions/pre-op appt. Rx sent to pharmacy

## 2022-02-21 ENCOUNTER — Encounter: Payer: Self-pay | Admitting: *Deleted

## 2022-02-21 ENCOUNTER — Telehealth: Payer: Self-pay | Admitting: Obstetrics & Gynecology

## 2022-02-21 NOTE — Telephone Encounter (Signed)
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 Please advise.

## 2022-02-21 NOTE — Telephone Encounter (Signed)
Pt aware return to work note was sent to her MyChart. [place]

## 2022-03-04 ENCOUNTER — Encounter: Payer: Self-pay | Admitting: Obstetrics & Gynecology

## 2022-04-01 ENCOUNTER — Encounter (HOSPITAL_COMMUNITY)
Admission: RE | Admit: 2022-04-01 | Discharge: 2022-04-01 | Disposition: A | Discharge status: Home / Self Care | Payer: 59 | Source: Ambulatory Visit | Attending: Internal Medicine | Admitting: Internal Medicine

## 2022-04-01 ENCOUNTER — Encounter (HOSPITAL_COMMUNITY): Payer: Self-pay

## 2022-04-01 ENCOUNTER — Other Ambulatory Visit: Payer: Self-pay

## 2022-04-01 NOTE — Patient Instructions (Signed)
Natalie Cordova  04/01/2022     '@PREFPERIOPPHARMACY'$ @   Your procedure is scheduled on  04/03/2022.   Report to [facility] at  0600  A.M.   Call this number if you have problems the morning of surgery:  [phone]   Remember:  Follow the diet and prep instructions given to you by the office.    Take these medicines the morning of surgery with A SIP OF WATER                               metoprolol, imitrex.    Do not wear jewelry, make-up or nail polish.  Do not wear lotions, powders, or perfumes, or deodorant.  Do not shave 48 hours prior to surgery.  Men may shave face and neck.  Do not bring valuables to the hospital.  [facility] is not responsible for any belongings or valuables.  Contacts, dentures or bridgework may not be worn into surgery.  Leave your suitcase in the car.  After surgery it may be brought to your room.  For patients admitted to the hospital, discharge time will be determined by your treatment team.  Patients discharged the day of surgery will not be allowed to drive home and must have someone with you for 24 hours.    Please read over the following fact sheets that you were given. Anesthesia Post-op Instructions and Care and Recovery After Surgery      Colonoscopy, Adult, Care After The following information offers guidance on how to care for yourself after your procedure. Your health care provider may also give you more specific instructions. If you have problems or questions, contact your health care provider. What can I expect after the procedure? After the procedure, it is common to have: A small amount of blood in your stool for 24 hours after the procedure. Some gas. Mild cramping or bloating of your abdomen. Follow these instructions at home: Eating and drinking  Drink enough fluid to keep your urine pale yellow. Follow instructions from your health care provider about eating or drinking restrictions. Resume your normal  diet as told by your health care provider. Avoid heavy or fried foods that are hard to digest. Activity Rest as told by your health care provider. Avoid sitting for a long time without moving. Get up to take short walks every 1-2 hours. This is important to improve blood flow and breathing. Ask for help if you feel weak or unsteady. Return to your normal activities as told by your health care provider. Ask your health care provider what activities are safe for you. Managing cramping and bloating  Try walking around when you have cramps or feel bloated. If directed, apply heat to your abdomen as told by your health care provider. Use the heat source that your health care provider recommends, such as a moist heat pack or a heating pad. Place a towel between your skin and the heat source. Leave the heat on for 20-30 minutes. Remove the heat if your skin turns bright red. This is especially important if you are unable to feel pain, heat, or cold. You have a greater risk of getting burned. General instructions If you were given a sedative during the procedure, it can affect you for several hours. Do not drive or operate machinery until your health care provider says that it is safe. For the first 24 hours after the  procedure: Do not sign important documents. Do not drink alcohol. Do your regular daily activities at a slower pace than normal. Eat soft foods that are easy to digest. Take over-the-counter and prescription medicines only as told by your health care provider. Keep all follow-up visits. This is important. Contact a health care provider if: You have blood in your stool 2-3 days after the procedure. Get help right away if: You have more than a small spotting of blood in your stool. You have large blood clots in your stool. You have swelling of your abdomen. You have nausea or vomiting. You have a fever. You have increasing pain in your abdomen that is not relieved with  medicine. These symptoms may be an emergency. Get help right away. Call 911. Do not wait to see if the symptoms will go away. Do not drive yourself to the hospital. Summary After the procedure, it is common to have a small amount of blood in your stool. You may also have mild cramping and bloating of your abdomen. If you were given a sedative during the procedure, it can affect you for several hours. Do not drive or operate machinery until your health care provider says that it is safe. Get help right away if you have a lot of blood in your stool, nausea or vomiting, a fever, or increased pain in your abdomen. This information is not intended to replace advice given to you by your health care provider. Make sure you discuss any questions you have with your health care provider. Document Revised: 02/21/2021 Document Reviewed: 02/21/2021 Elsevier Patient Education  [place] After This sheet gives you information about how to care for yourself after your procedure. Your health care provider may also give you more specific instructions. If you have problems or questions, contact your health care provider. What can I expect after the procedure? After the procedure, it is common to have: Tiredness. Forgetfulness about what happened after the procedure. Impaired judgment for important decisions. Nausea or vomiting. Some difficulty with balance. Follow these instructions at home: For the time period you were told by your health care provider:     Rest as needed. Do not participate in activities where you could fall or become injured. Do not drive or use machinery. Do not drink alcohol. Do not take sleeping pills or medicines that cause drowsiness. Do not make important decisions or sign legal documents. Do not take care of children on your own. Eating and drinking Follow the diet that is recommended by your health care provider. Drink enough fluid  to keep your urine pale yellow. If you vomit: Drink water, juice, or soup when you can drink without vomiting. Make sure you have little or no nausea before eating solid foods. General instructions Have a responsible adult stay with you for the time you are told. It is important to have someone help care for you until you are awake and alert. Take over-the-counter and prescription medicines only as told by your health care provider. If you have sleep apnea, surgery and certain medicines can increase your risk for breathing problems. Follow instructions from your health care provider about wearing your sleep device: Anytime you are sleeping, including during daytime naps. While taking prescription pain medicines, sleeping medicines, or medicines that make you drowsy. Avoid smoking. Keep all follow-up visits as told by your health care provider. This is important. Contact a health care provider if: You keep feeling nauseous or you keep vomiting.  You feel light-headed. You are still sleepy or having trouble with balance after 24 hours. You develop a rash. You have a fever. You have redness or swelling around the IV site. Get help right away if: You have trouble breathing. You have new-onset confusion at home. Summary For several hours after your procedure, you may feel tired. You may also be forgetful and have poor judgment. Have a responsible adult stay with you for the time you are told. It is important to have someone help care for you until you are awake and alert. Rest as told. Do not drive or operate machinery. Do not drink alcohol or take sleeping pills. Get help right away if you have trouble breathing, or if you suddenly become confused. This information is not intended to replace advice given to you by your health care provider. Make sure you discuss any questions you have with your health care provider. Document Revised: 06/05/2021 Document Reviewed: 06/03/2019 Elsevier Patient  Education  [place].

## 2022-04-03 ENCOUNTER — Other Ambulatory Visit: Payer: Self-pay

## 2022-04-03 ENCOUNTER — Encounter (HOSPITAL_COMMUNITY): Payer: Self-pay | Admitting: Internal Medicine

## 2022-04-03 ENCOUNTER — Ambulatory Visit (HOSPITAL_COMMUNITY): Payer: 59 | Admitting: Anesthesiology

## 2022-04-03 ENCOUNTER — Ambulatory Visit (HOSPITAL_COMMUNITY)
Admission: RE | Admit: 2022-04-03 | Discharge: 2022-04-03 | Disposition: A | Discharge status: Home / Self Care | Payer: 59 | Attending: Internal Medicine | Admitting: Internal Medicine

## 2022-04-03 ENCOUNTER — Encounter (HOSPITAL_COMMUNITY)
Admission: RE | Disposition: A | Discharge status: Home / Self Care | Payer: Self-pay | Source: Home / Self Care | Attending: Internal Medicine

## 2022-04-03 ENCOUNTER — Ambulatory Visit (HOSPITAL_BASED_OUTPATIENT_CLINIC_OR_DEPARTMENT_OTHER): Payer: 59 | Admitting: Anesthesiology

## 2022-04-03 DIAGNOSIS — F418 Other specified anxiety disorders: Secondary | ICD-10-CM

## 2022-04-03 DIAGNOSIS — Z6841 Body Mass Index (BMI) 40.0 and over, adult: Secondary | ICD-10-CM | POA: Insufficient documentation

## 2022-04-03 DIAGNOSIS — K5730 Diverticulosis of large intestine without perforation or abscess without bleeding: Secondary | ICD-10-CM

## 2022-04-03 DIAGNOSIS — J45909 Unspecified asthma, uncomplicated: Secondary | ICD-10-CM | POA: Diagnosis not present

## 2022-04-03 DIAGNOSIS — Z1211 Encounter for screening for malignant neoplasm of colon: Secondary | ICD-10-CM | POA: Diagnosis not present

## 2022-04-03 DIAGNOSIS — I10 Essential (primary) hypertension: Secondary | ICD-10-CM | POA: Insufficient documentation

## 2022-04-03 DIAGNOSIS — Z1212 Encounter for screening for malignant neoplasm of rectum: Secondary | ICD-10-CM | POA: Diagnosis not present

## 2022-04-03 HISTORY — PX: COLONOSCOPY WITH PROPOFOL: SHX5780

## 2022-04-03 SURGERY — COLONOSCOPY WITH PROPOFOL
Anesthesia: General

## 2022-04-03 MED ORDER — LACTATED RINGERS IV SOLN: INTRAVENOUS | Status: DC | PRN

## 2022-04-03 MED ORDER — LIDOCAINE HCL 1 % IJ SOLN
INTRAMUSCULAR | Status: DC | PRN
  Administered 2022-04-03: 50 mg via INTRADERMAL

## 2022-04-03 MED ORDER — SODIUM CHLORIDE FLUSH 0.9 % IV SOLN
INTRAVENOUS | Status: AC
  Filled 2022-04-03: qty 20

## 2022-04-03 MED ORDER — SODIUM CHLORIDE FLUSH 0.9 % IV SOLN
INTRAVENOUS | Status: AC
  Filled 2022-04-03: qty 10

## 2022-04-03 MED ORDER — LIDOCAINE HCL (PF) 2 % IJ SOLN
INTRAMUSCULAR | Status: AC
  Filled 2022-04-03: qty 5

## 2022-04-03 MED ORDER — LIDOCAINE HCL (PF) 2 % IJ SOLN
INTRAMUSCULAR | Status: AC
  Filled 2022-04-03: qty 10

## 2022-04-03 MED ORDER — PROPOFOL 500 MG/50ML IV EMUL
INTRAVENOUS | Status: DC | PRN
  Administered 2022-04-03: 150 ug/kg/min via INTRAVENOUS

## 2022-04-03 MED ORDER — PROPOFOL 10 MG/ML IV BOLUS
INTRAVENOUS | Status: DC | PRN
  Administered 2022-04-03: 60 mg via INTRAVENOUS

## 2022-04-03 NOTE — Op Note (Signed)
[facility] Patient Name: Natalie Cordova Procedure Date: 04/03/2022 7:16 AM MRN: [id number] Date of Birth: Jun 05, 1976 Attending MD: Norvel Richards , MD CSN: [id number] Age: 46 Admit Type: Outpatient Procedure:                Colonoscopy Indications:              Screening for colorectal malignant neoplasm Providers:                Norvel Richards, MD, Charlsie Quest. Theda Sers RN, RN,                            Lambert Mody, Raphael Gibney, Technician Referring MD:              Medicines:                Propofol per Anesthesia Complications:             Estimated Blood Loss:     Estimated blood loss: none. Procedure:                Pre-Anesthesia Assessment:                           - Prior to the procedure, a History and Physical                            was performed, and patient medications and                            allergies were reviewed. The patient's tolerance of                            previous anesthesia was also reviewed. The risks                            and benefits of the procedure and the sedation                            options and risks were discussed with the patient.                            All questions were answered, and informed consent                            was obtained. Prior Anticoagulants: The patient has                            taken no previous anticoagulant or antiplatelet                            agents. ASA Grade Assessment: II - A patient with                            mild systemic disease. After reviewing the risks  and benefits, the patient was deemed in                            satisfactory condition to undergo the procedure.                           After obtaining informed consent, the colonoscope                            was passed under direct vision. Throughout the                            procedure, the patient's blood pressure, pulse, and                             oxygen saturations were monitored continuously. The                            [phone]) scope was introduced through                            the anus and advanced to the the cecum, identified                            by appendiceal orifice and ileocecal valve. The                            colonoscopy was performed without difficulty. The                            patient tolerated the procedure well. The quality                            of the bowel preparation was adequate. Scope In: 9:92:42 AM Scope Out: 7:58:05 AM Scope Withdrawal Time: 0 hours 7 minutes 12 seconds  Total Procedure Duration: 0 hours 11 minutes 26 seconds  Findings:      The perianal and digital rectal examinations were normal.      A few small-mouthed diverticula were found in the sigmoid colon.      The exam was otherwise without abnormality on direct and retroflexion       views. Impression:               - Diverticulosis in the sigmoid colon (rare).                           - The examination was otherwise normal on direct                            and retroflexion views.                           - No specimens collected. Moderate Sedation:      Moderate (conscious) sedation was personally administered by an       anesthesia professional. The following parameters were monitored: oxygen  saturation, heart rate, blood pressure, respiratory rate, EKG, adequacy       of pulmonary ventilation, and response to care. Recommendation:           - Patient has a contact number available for                            emergencies. The signs and symptoms of potential                            delayed complications were discussed with the                            patient. Return to normal activities tomorrow.                            Written discharge instructions were provided to the                            patient.                           - Resume previous diet.                            - Continue present medications.                           - Repeat colonoscopy in 10 years for screening                            purposes.                           - Return to GI office in 6 months. Procedure Code(s):        --- Professional ---                           [phone], Colonoscopy, flexible; diagnostic, including                            collection of specimen(s) by brushing or washing,                            when performed (separate procedure) Diagnosis Code(s):        --- Professional ---                           Z12.11, Encounter for screening for malignant                            neoplasm of colon                           K57.30, Diverticulosis of large intestine without  perforation or abscess without bleeding CPT copyright 2019 American Medical Association. All rights reserved. The codes documented in this report are preliminary and upon coder review may  be revised to meet current compliance requirements. [name]. Ivon Oelkers, MD Norvel Richards, MD 04/03/2022 8:06:25 AM This report has been signed electronically. Number of Addenda: 0

## 2022-04-03 NOTE — Anesthesia Preprocedure Evaluation (Signed)
Anesthesia Evaluation  Patient identified by MRN, date of birth, ID band Patient awake    Reviewed: Allergy & Precautions, H&P , NPO status , Patient's Chart, lab work & pertinent test results, reviewed documented beta blocker date and time   Airway Mallampati: I  TM Distance: >3 FB Neck ROM: full    Dental no notable dental hx.    Pulmonary asthma ,    Pulmonary exam normal breath sounds clear to auscultation       Cardiovascular Exercise Tolerance: Good hypertension, negative cardio ROS   Rhythm:regular Rate:Normal     Neuro/Psych  Headaches, PSYCHIATRIC DISORDERS Anxiety Depression    GI/Hepatic negative GI ROS, Neg liver ROS,   Endo/Other  Morbid obesity  Renal/GU negative Renal ROS  negative genitourinary   Musculoskeletal   Abdominal   Peds  Hematology negative hematology ROS (+)   Anesthesia Other Findings   Reproductive/Obstetrics negative OB ROS                             Anesthesia Physical  Anesthesia Plan  ASA: 3  Anesthesia Plan: General   Post-op Pain Management:    Induction:   PONV Risk Score and Plan: Propofol infusion  Airway Management Planned:   Additional Equipment:   Intra-op Plan:   Post-operative Plan:   Informed Consent: I have reviewed the patients History and Physical, chart, labs and discussed the procedure including the risks, benefits and alternatives for the proposed anesthesia with the patient or authorized representative who has indicated his/her understanding and acceptance.     Dental Advisory Given  Plan Discussed with: CRNA  Anesthesia Plan Comments:         Anesthesia Quick Evaluation

## 2022-04-03 NOTE — Anesthesia Postprocedure Evaluation (Signed)
Anesthesia Post Note  Patient: Natalie Cordova  Procedure(s) Performed: COLONOSCOPY WITH PROPOFOL  Patient location during evaluation: Short Stay Anesthesia Type: General Level of consciousness: awake and alert Pain management: pain level controlled Vital Signs Assessment: post-procedure vital signs reviewed and stable Respiratory status: spontaneous breathing Cardiovascular status: blood pressure returned to baseline and stable Postop Assessment: no apparent nausea or vomiting Anesthetic complications: no   No notable events documented.   Last Vitals:  Vitals:   04/03/22 0646 04/03/22 0801  BP: 126/74 110/60  Pulse: 77 83  Resp: 17 16  Temp: 36.7 C 36.7 C  SpO2: 100% 100%    Last Pain:  Vitals:   04/03/22 0801  TempSrc: Oral  PainSc: 0-No pain                 Jaeleah Smyser

## 2022-04-03 NOTE — Transfer of Care (Signed)
Immediate Anesthesia Transfer of Care Note  Patient: Natalie Cordova  Procedure(s) Performed: COLONOSCOPY WITH PROPOFOL  Patient Location: Short Stay  Anesthesia Type:General  Level of Consciousness: awake  Airway & Oxygen Therapy: Patient Spontanous Breathing  Post-op Assessment: Report given to RN  Post vital signs: Reviewed and stable  Last Vitals:  Vitals Value Taken Time  BP 110/60 04/03/22 0801  Temp 36.7 C 04/03/22 0801  Pulse 83 04/03/22 0801  Resp 16 04/03/22 0801  SpO2 100 % 04/03/22 0801    Last Pain:  Vitals:   04/03/22 0801  TempSrc: Oral  PainSc: 0-No pain      Patients Stated Pain Goal: 5 (59/74/71 8550)  Complications: No notable events documented.

## 2022-04-03 NOTE — Discharge Instructions (Signed)
  Colonoscopy Discharge Instructions  Read the instructions outlined below and refer to this sheet in the next few weeks. These discharge instructions provide you with general information on caring for yourself after you leave the hospital. Your doctor may also give you specific instructions. While your treatment has been planned according to the most current medical practices available, unavoidable complications occasionally occur. If you have any problems or questions after discharge, call Dr. Gala Romney at [phone]. ACTIVITY You may resume your regular activity, but move at a slower pace for the next 24 hours.  Take frequent rest periods for the next 24 hours.  Walking will help get rid of the air and reduce the bloated feeling in your belly (abdomen).  No driving for 24 hours (because of the medicine (anesthesia) used during the test).   Do not sign any important legal documents or operate any machinery for 24 hours (because of the anesthesia used during the test).  NUTRITION Drink plenty of fluids.  You may resume your normal diet as instructed by your doctor.  Begin with a light meal and progress to your normal diet. Heavy or fried foods are harder to digest and may make you feel sick to your stomach (nauseated).  Avoid alcoholic beverages for 24 hours or as instructed.  MEDICATIONS You may resume your normal medications unless your doctor tells you otherwise.  WHAT YOU CAN EXPECT TODAY Some feelings of bloating in the abdomen.  Passage of more gas than usual.  Spotting of blood in your stool or on the toilet paper.  IF YOU HAD POLYPS REMOVED DURING THE COLONOSCOPY: No aspirin products for 7 days or as instructed.  No alcohol for 7 days or as instructed.  Eat a soft diet for the next 24 hours.  FINDING OUT THE RESULTS OF YOUR TEST Not all test results are available during your visit. If your test results are not back during the visit, make an appointment with your caregiver to find out the  results. Do not assume everything is normal if you have not heard from your caregiver or the medical facility. It is important for you to follow up on all of your test results.  SEEK IMMEDIATE MEDICAL ATTENTION IF: You have more than a spotting of blood in your stool.  Your belly is swollen (abdominal distention).  You are nauseated or vomiting.  You have a temperature over 101.  You have abdominal pain or discomfort that is severe or gets worse throughout the day.      No polyps found today  It is recommended you return for repeat colonoscopy in 10 years  Office visit with [place] in 6 months  At patient request, I called Dexter Philipp Ovens at [phone] -reviewed findings and recommendations

## 2022-04-03 NOTE — H&P (Signed)
$'@LOGO'j$ @   Primary Care Physician:  Carlis Abbott, NP Primary Gastroenterologist:  Dr. Gala Romney  Pre-Procedure History & Physical: HPI:  Natalie Cordova is a 46 y.o. female is here for a screening colonoscopy.  Chronic constipation managed on current regimen.  No prior colonoscopy.  Family history colon polyps [place] relative but at advanced age (greater than 70)  Past Medical History:  Diagnosis Date   Anxiety    Asthma    Contraceptive management 03/07/2014   Depression    Essential hypertension    related to anxiety and stress per patient   Fibroids 07/22/2017   Heart palpitations    Migraine headache    Osteoarthritis of both knees     Past Surgical History:  Procedure Laterality Date   HYSTEROSCOPY N/A 11/07/2017   Procedure: HYSTEROSCOPY WITH HYDROTHERMAL ABLATION;  Surgeon: Jonnie Kind, MD;  Location: [facility];  Service: Gynecology;  Laterality: N/A;   LAPAROSCOPIC BILATERAL SALPINGECTOMY Bilateral 11/07/2017   Procedure: LAPAROSCOPIC BILATERAL SALPINGECTOMY;  Surgeon: Jonnie Kind, MD;  Location: [facility];  Service: Gynecology;  Laterality: Bilateral;   LEEP  1995   SUPRACERVICAL ABDOMINAL HYSTERECTOMY N/A 01/09/2022   Procedure: HYSTERECTOMY SUPRACERVICAL ABDOMINAL;  Surgeon: Florian Buff, MD;  Location: [facility];  Service: Gynecology;  Laterality: N/A;   WISDOM TOOTH EXTRACTION      Prior to Admission medications   Medication Sig Start Date End Date Taking? Authorizing Provider  cetirizine (ZYRTEC) 10 MG tablet Take 10 mg by mouth daily as needed for allergies. 03/19/22  Yes [provider]  clonazePAM (KLONOPIN) 1 MG tablet Take 1 mg by mouth daily as needed for anxiety (sleep). 02/06/21  Yes [provider]  [employer] PURELAX 17 GM/SCOOP powder Take 17 g by mouth daily. 02/19/22  Yes [provider]  hydrochlorothiazide (HYDRODIURIL) 25 MG tablet Take 25 mg by mouth daily.   Yes [provider]  ibuprofen (ADVIL) 800 MG tablet Take 800  mg by mouth every 8 (eight) hours as needed for moderate pain or mild pain.   Yes [provider]  linaclotide (LINZESS) 145 MCG CAPS capsule Take 145 mcg by mouth daily. 02/19/22  Yes [provider]  metoprolol succinate (TOPROL-XL) 50 MG 24 hr tablet Take 1 tablet (50 mg total) by mouth at bedtime. 10/19/19  Yes Leonie Man, MD  PROAIR HFA 108 [phone] Base) MCG/ACT inhaler Inhale 2 puffs into the lungs every 4 (four) hours as needed for wheezing or shortness of breath.  09/18/16   [provider]  SUMAtriptan (IMITREX) 100 MG tablet Take 100 mg by mouth every 2 (two) hours as needed for migraine. 08/23/16   [provider]    Allergies as of 02/19/2022   (No Known Allergies)    Family History  Problem Relation Age of Onset   Breast cancer Mother    Healthy Mother    Healthy Father    Colon polyps Father        17s   Healthy Brother    Cancer Maternal Grandfather        lung   Breast cancer Maternal Aunt    Breast cancer Maternal Aunt    Colon cancer Neg Hx     Social History   Socioeconomic History   Marital status: Married    Spouse name: Not on file   Number of children: 1   Years of education: 12   Highest education level: Not on file  Occupational History   Not on  file  Tobacco Use   Smoking status: Never   Smokeless tobacco: Never  Vaping Use   Vaping Use: Never used  Substance and Sexual Activity   Alcohol use: Yes    Comment: occ beer or wine   Drug use: No   Sexual activity: Not Currently    Birth control/protection: Surgical    Comment: [facility]  Other Topics Concern   Not on file  Social History Narrative   She has been married for 3-1/2 years. She lives with her husband and daughter (16 year old). She has a high school education, but is not currently working. She does not exercise because of lack of motivation. She does not drink alcohol or smoke.   Social Determinants of Health   Financial Resource Strain: Not on file   Food Insecurity: Not on file  Transportation Needs: Not on file  Physical Activity: Not on file  Stress: Not on file  Social Connections: Not on file  Intimate Partner Violence: Not on file    Review of Systems: See HPI, otherwise negative ROS  Physical Exam: BP 126/74   Pulse 77   Temp 98.1 F (36.7 C) (Oral)   Resp 17   LMP 10/26/2021   SpO2 100%  General:   Alert,  Well-developed, well-nourished, pleasant and cooperative in NAD Neck:  Supple; no masses or thyromegaly. Lungs:  Clear throughout to auscultation.   No wheezes, crackles, or rhonchi. No acute distress. Heart:  Regular rate and rhythm; no murmurs, clicks, rubs,  or gallops. Abdomen:  Soft, nontender and nondistended. No masses, hepatosplenomegaly or hernias noted. Normal bowel sounds, without guarding, and without rebound.     Impression/Plan: Natalie Cordova is now here to undergo a screening colonoscopy.  First ever average risk screening examination  Risks, benefits, limitations, imponderables and alternatives regarding colonoscopy have been reviewed with the patient. Questions have been answered. All parties agreeable.     Notice:  This dictation was prepared with Dragon dictation along with smaller phrase technology. Any transcriptional errors that result from this process are unintentional and may not be corrected upon review.

## 2022-04-11 ENCOUNTER — Encounter (HOSPITAL_COMMUNITY): Payer: Self-pay | Admitting: Internal Medicine

## 2022-05-13 ENCOUNTER — Other Ambulatory Visit: Payer: Self-pay | Admitting: Family Medicine

## 2022-05-13 DIAGNOSIS — Z1231 Encounter for screening mammogram for malignant neoplasm of breast: Secondary | ICD-10-CM

## 2022-06-20 IMAGING — MG MM DIGITAL SCREENING BILAT W/ TOMO AND CAD
8 series · 8 of 24 positions shown · non-contrast
Comparison: Previous exam(s).

CLINICAL DATA: Screening.

EXAM:
DIGITAL SCREENING BILATERAL MAMMOGRAM WITH TOMOSYNTHESIS AND CAD
TECHNIQUE: Bilateral screening digital craniocaudal and mediolateral oblique
mammograms were obtained. Bilateral screening digital breast
tomosynthesis was performed. The images were evaluated with
computer-aided detection.

[L CC synth-2D]
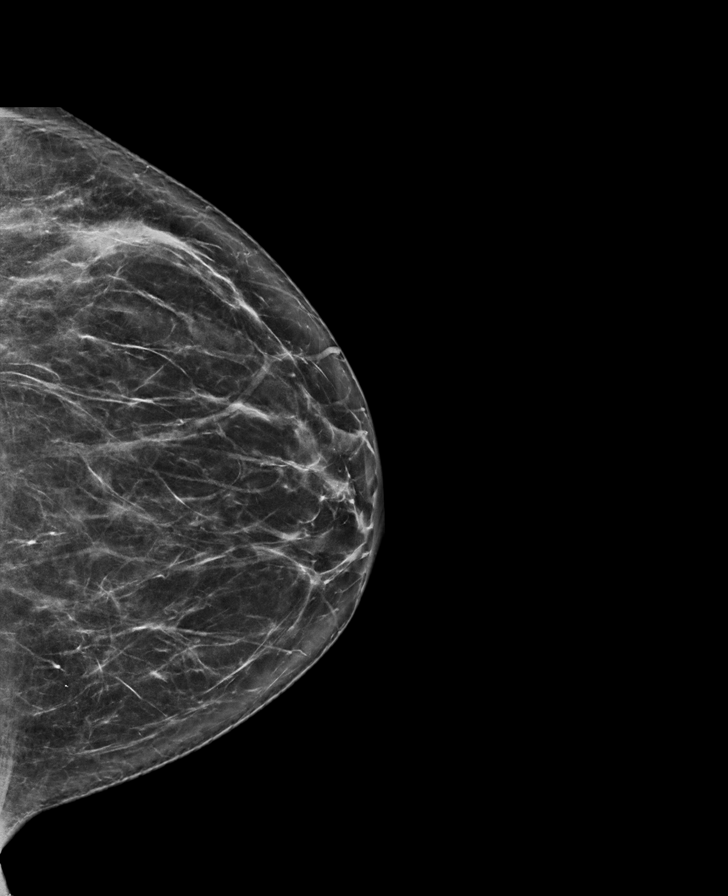

[L MLO synth-2D]
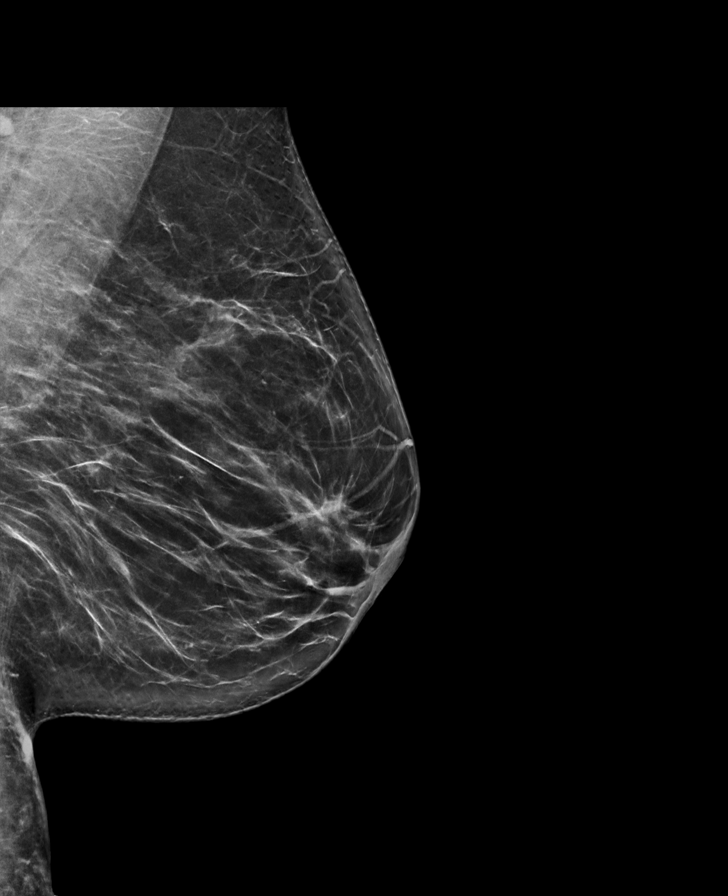

[R MLO synth-2D]
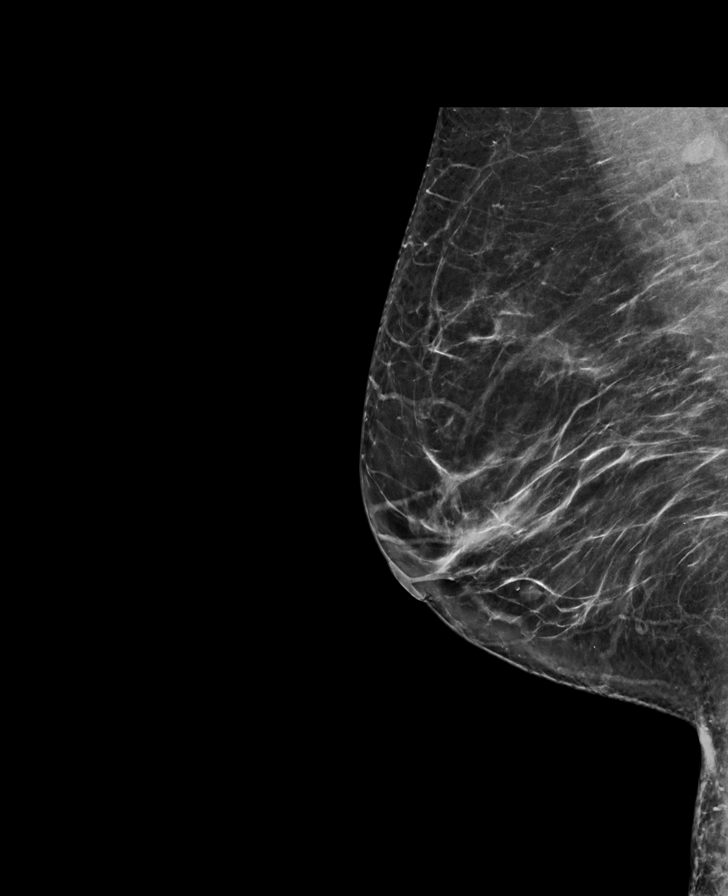

[R CC synth-2D]
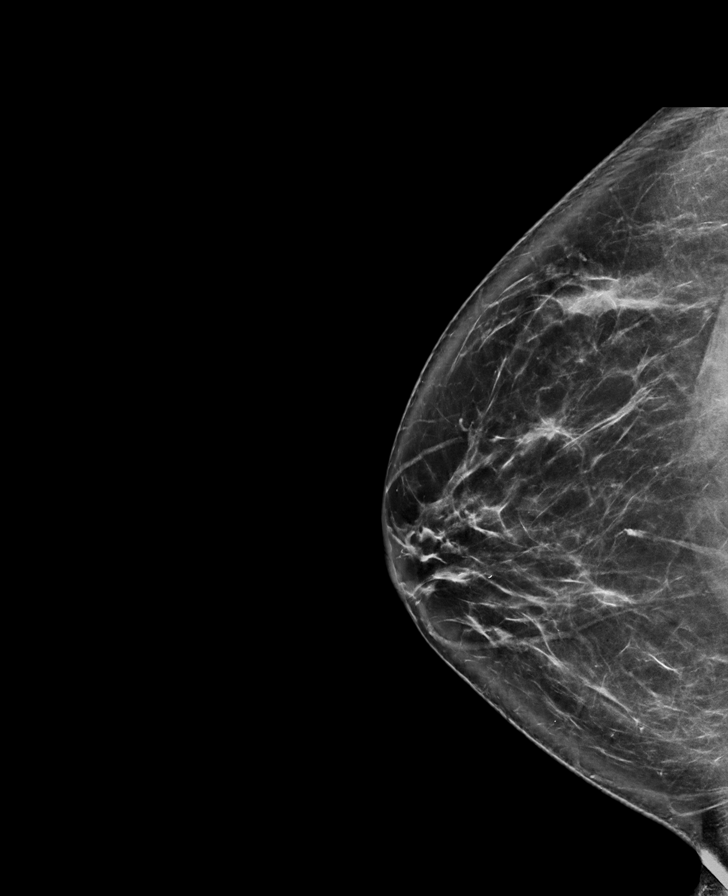

[L CC tomo · tomo slice 39/77.0]
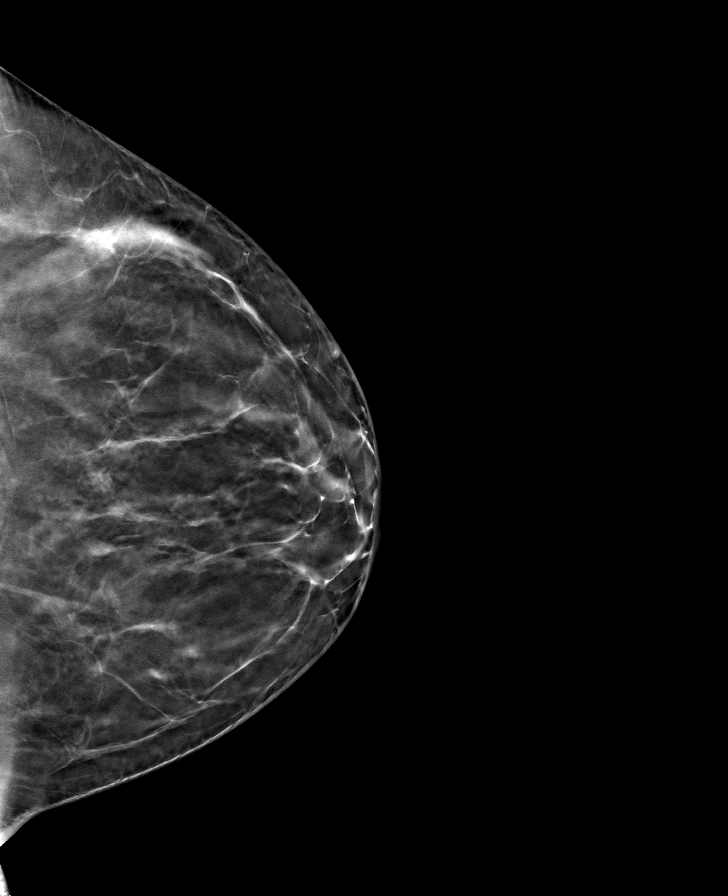

[R CC tomo · tomo slice 44/87.0]
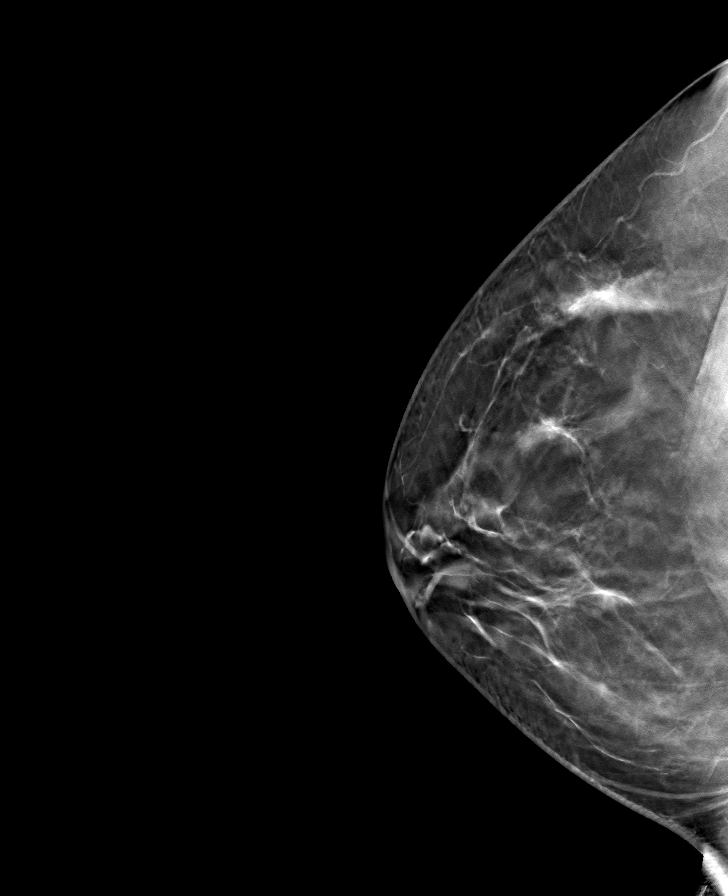

[L MLO tomo · tomo slice 44/87.0]
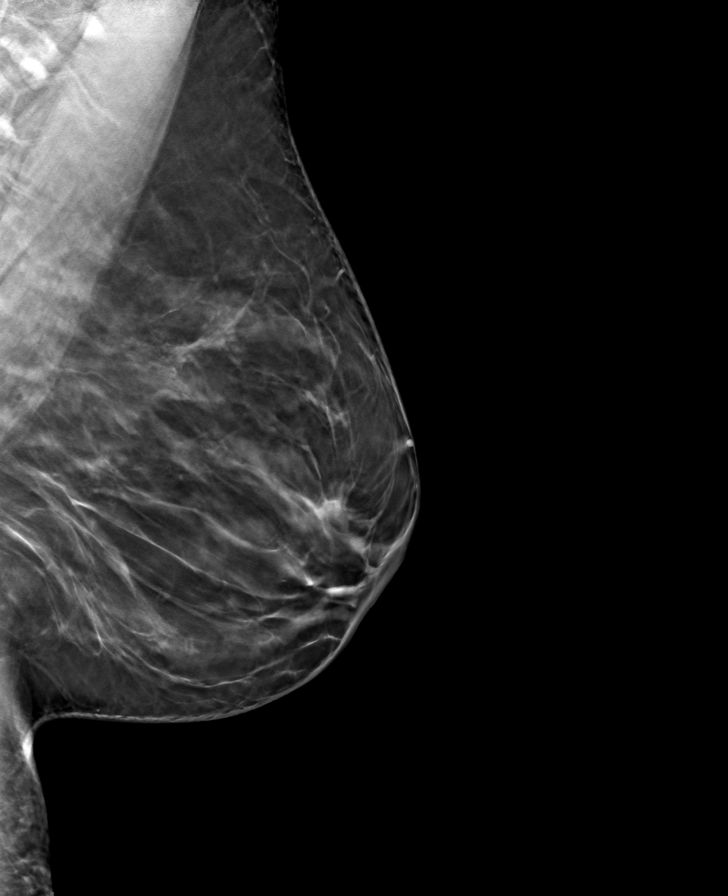

[R MLO tomo · tomo slice 44/87.0]
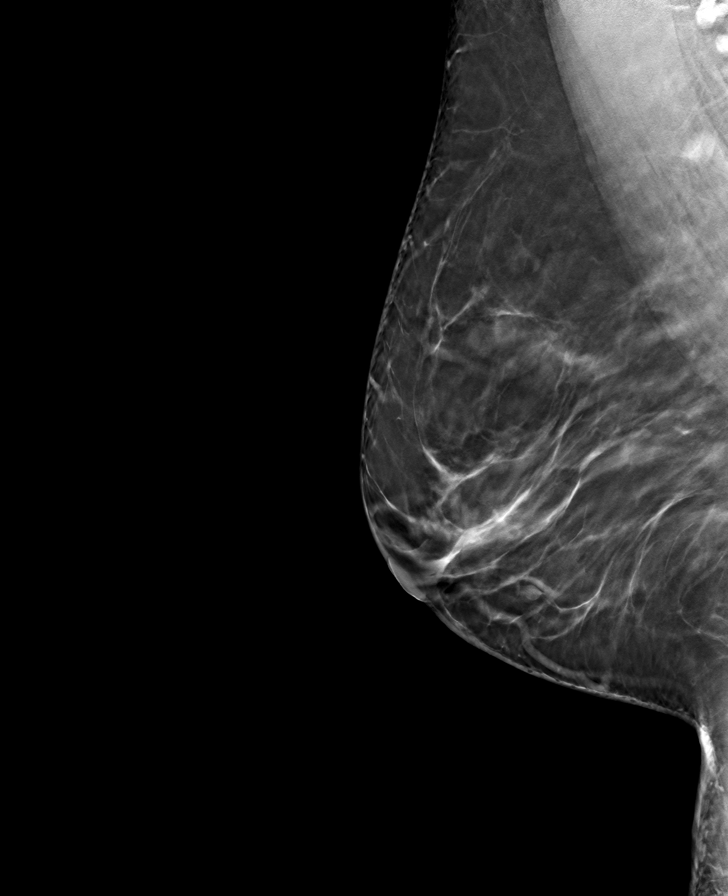

[8 of 24 positions shown; findings below may reference images not displayed]

ACR Breast Density Category b: There are scattered areas of
fibroglandular density.
FINDINGS: There are no findings suspicious for malignancy.
IMPRESSION: No mammographic evidence of malignancy. A result letter of this
screening mammogram will be mailed directly to the patient.

RECOMMENDATION:
Screening mammogram in one year. (Code:51-O-LD2)

BI-RADS CATEGORY  1: Negative.

## 2022-07-10 ENCOUNTER — Ambulatory Visit
Admission: RE | Admit: 2022-07-10 | Discharge: 2022-07-10 | Disposition: A | Discharge status: Home / Self Care | Payer: 59 | Source: Ambulatory Visit | Attending: Family Medicine | Admitting: Family Medicine

## 2022-07-10 DIAGNOSIS — Z1231 Encounter for screening mammogram for malignant neoplasm of breast: Secondary | ICD-10-CM

## 2022-08-15 ENCOUNTER — Encounter: Payer: Self-pay | Admitting: Internal Medicine

## 2022-09-02 ENCOUNTER — Other Ambulatory Visit (HOSPITAL_COMMUNITY)
Admission: RE | Admit: 2022-09-02 | Discharge: 2022-09-02 | Disposition: A | Discharge status: Home / Self Care | Payer: 59 | Source: Ambulatory Visit | Attending: Adult Health | Admitting: Adult Health

## 2022-09-02 ENCOUNTER — Ambulatory Visit: Payer: 59 | Admitting: Adult Health

## 2022-09-02 ENCOUNTER — Encounter: Payer: Self-pay | Admitting: Adult Health

## 2022-09-02 VITALS — BP 133/85 | HR 84 | Ht 65.5 in | Wt 257.0 lb

## 2022-09-02 DIAGNOSIS — N898 Other specified noninflammatory disorders of vagina: Secondary | ICD-10-CM | POA: Diagnosis not present

## 2022-09-02 DIAGNOSIS — Z90711 Acquired absence of uterus with remaining cervical stump: Secondary | ICD-10-CM | POA: Diagnosis not present

## 2022-09-02 DIAGNOSIS — N930 Postcoital and contact bleeding: Secondary | ICD-10-CM

## 2022-09-02 DIAGNOSIS — Z9889 Other specified postprocedural states: Secondary | ICD-10-CM | POA: Insufficient documentation

## 2022-09-02 DIAGNOSIS — Z124 Encounter for screening for malignant neoplasm of cervix: Secondary | ICD-10-CM

## 2022-09-02 DIAGNOSIS — Z6841 Body Mass Index (BMI) 40.0 and over, adult: Secondary | ICD-10-CM

## 2022-09-02 MED ORDER — METRONIDAZOLE 0.75 % VA GEL: 1.0000 | Freq: Every day | VAGINAL | 1 refills | Status: DC

## 2022-09-02 NOTE — Progress Notes (Signed)
  Subjective:     Patient ID: Natalie Cordova, female   DOB: [birth year], 47 y.o.   MRN: [id number]  HPI Natalie Cordova is a 47 year old black female, married, sp [facility], in complaining of spotting after sex and has noticed fishy odor at times. She wants to lose weight too. She needs pap too.  PCP is Natalie Abbott NP  Review of Systems Spotting after sex Has noticed fishy odor Wants to lose weight Reviewed past medical,surgical, social and family history. Reviewed medications and allergies.     Objective:   Physical Exam BP 133/85 (BP Location: Right Arm, Patient Position: Sitting, Cuff Size: Large)   Pulse 84   Ht 5' 5.5" (1.664 m)   Wt 257 lb (116.6 kg)   LMP 10/26/2021   BMI 42.12 kg/m  Skin warm and dry. Lungs: clear to ausculation bilaterally. Cardiovascular: regular rate and rhythm.    Pelvic: external genitalia is normal in appearance no lesions, vagina: white discharge without odor,urethra has no lesions or masses noted, cervix:smooth and bulbous,, and friable with ECC brush, pap with GC/CHL and  HR HPV genotyping performed, uterus: absent, adnexa: no masses or tenderness noted. Bladder is non tender and no masses felt.  Fall risk is low  Upstream - 09/02/22 1031       Pregnancy Intention Screening   Does the patient want to become pregnant in the next year? N/A    Does the patient's partner want to become pregnant in the next year? N/A    Would the patient like to discuss contraceptive options today? N/A      Contraception Wrap Up   Current Method Female Sterilization   [facility]   End Method Female Sterilization   [facility]   Contraception Counseling Provided No            Examination chaperoned by Natalie Pupa LPN   Assessment:     1. Routine Papanicolaou smear Pap sent Pap in 3 years if normal Physical with PCP Labs with PCP Had negative mammogram 07/10/22 Colonoscopy per GI  - Cytology - PAP( [place])  2. Postcoital and contact bleeding +spotting after sex and  had after pap today Will rx Metrogel Meds ordered this encounter  Medications   metroNIDAZOLE (METROGEL) 0.75 % vaginal gel    Sig: Place 1 Applicatorful vaginally at bedtime.    Dispense:  70 g    Refill:  1    Order Specific Question:   Supervising Provider    Answer:   EURE, LUTHER H [[id number]]     3. Vaginal odor Has noticed fishy odor at times None today  4. S/P abdominal supracervical subtotal hysterectomy Had 01/09/22  5. BMI 40.0-44.9, adult ([place]) Discussed phentermine, and injection and she wants injection will see PCP    Plan:     Follow up in 3 years for pap or sooner if needed

## 2022-09-04 LAB — CYTOLOGY - PAP
Chlamydia: NEGATIVE
Comment: NEGATIVE
Comment: NEGATIVE
Comment: NORMAL
Diagnosis: NEGATIVE
High risk HPV: NEGATIVE
Neisseria Gonorrhea: NEGATIVE

## 2022-09-30 ENCOUNTER — Other Ambulatory Visit (HOSPITAL_COMMUNITY): Payer: Self-pay | Admitting: Family Medicine

## 2022-09-30 DIAGNOSIS — M170 Bilateral primary osteoarthritis of knee: Secondary | ICD-10-CM

## 2022-10-10 ENCOUNTER — Encounter: Payer: 59 | Admitting: Nurse Practitioner

## 2022-10-21 ENCOUNTER — Ambulatory Visit (HOSPITAL_COMMUNITY)
Admission: RE | Admit: 2022-10-21 | Discharge: 2022-10-21 | Disposition: A | Discharge status: Home / Self Care | Payer: 59 | Source: Ambulatory Visit | Attending: Family Medicine | Admitting: Family Medicine

## 2022-10-21 DIAGNOSIS — M170 Bilateral primary osteoarthritis of knee: Secondary | ICD-10-CM | POA: Diagnosis not present

## 2022-11-05 ENCOUNTER — Ambulatory Visit: Payer: 59 | Admitting: Internal Medicine

## 2022-11-05 ENCOUNTER — Encounter: Payer: Self-pay | Admitting: Internal Medicine

## 2023-01-21 ENCOUNTER — Other Ambulatory Visit (HOSPITAL_COMMUNITY): Payer: Self-pay | Admitting: Family Medicine

## 2023-01-21 ENCOUNTER — Encounter (HOSPITAL_COMMUNITY): Payer: Self-pay | Admitting: Family Medicine

## 2023-01-21 ENCOUNTER — Ambulatory Visit (HOSPITAL_COMMUNITY)
Admission: RE | Admit: 2023-01-21 | Discharge: 2023-01-21 | Disposition: A | Discharge status: Home / Self Care | Payer: 59 | Source: Ambulatory Visit | Attending: Family Medicine | Admitting: Family Medicine

## 2023-01-21 DIAGNOSIS — M25511 Pain in right shoulder: Secondary | ICD-10-CM

## 2023-04-03 ENCOUNTER — Ambulatory Visit (INDEPENDENT_AMBULATORY_CARE_PROVIDER_SITE_OTHER): Payer: 59

## 2023-04-03 ENCOUNTER — Ambulatory Visit: Payer: Self-pay | Admitting: Podiatry

## 2023-04-03 ENCOUNTER — Encounter: Payer: Self-pay | Admitting: Podiatry

## 2023-04-03 DIAGNOSIS — M722 Plantar fascial fibromatosis: Secondary | ICD-10-CM | POA: Diagnosis not present

## 2023-04-03 MED ORDER — MELOXICAM 15 MG PO TABS: 15.0000 mg | ORAL_TABLET | Freq: Every day | ORAL | 3 refills | Status: DC

## 2023-04-03 MED ORDER — METHYLPREDNISOLONE 4 MG PO TBPK: ORAL_TABLET | ORAL | 0 refills | Status: DC

## 2023-04-03 MED ORDER — TRIAMCINOLONE ACETONIDE 40 MG/ML IJ SUSP
20.0000 mg | Freq: Once | INTRAMUSCULAR | Status: AC
  Administered 2023-04-03: 20 mg

## 2023-04-06 NOTE — Progress Notes (Signed)
Subjective:  Patient ID: Natalie Cordova, female    DOB: 03-07-1976,  MRN: [id number] HPI Chief Complaint  Patient presents with   Foot Pain    Plantar heel right - woke up one morning 2 weeks ago with severe pain in the heel, tried some devices off [employer], iced, ibuprofen   New Patient (Initial Visit)    47 y.o. female presents with the above complaint.   ROS: Denies fever chills nausea vomiting muscle aches pains calf pain back pain chest pain shortness of breath  Past Medical History:  Diagnosis Date   Anxiety    Asthma    Contraceptive management 03/07/2014   Depression    Essential hypertension    related to anxiety and stress per patient   Fibroids 07/22/2017   Heart palpitations    Migraine headache    Osteoarthritis of both knees    Past Surgical History:  Procedure Laterality Date   COLONOSCOPY WITH PROPOFOL N/A 04/03/2022   Procedure: COLONOSCOPY WITH PROPOFOL;  Surgeon: Corbin Ade, MD;  [facility];  Service: Endoscopy;  Laterality: N/A;  7:30am   HYSTEROSCOPY N/A 11/07/2017   Procedure: HYSTEROSCOPY WITH HYDROTHERMAL ABLATION;  Surgeon: Tilda Burrow, MD;  Location: [facility];  Service: Gynecology;  Laterality: N/A;   LAPAROSCOPIC BILATERAL SALPINGECTOMY Bilateral 11/07/2017   Procedure: LAPAROSCOPIC BILATERAL SALPINGECTOMY;  Surgeon: Tilda Burrow, MD;  Location: [facility];  Service: Gynecology;  Laterality: Bilateral;   LEEP  1995   SUPRACERVICAL ABDOMINAL HYSTERECTOMY N/A 01/09/2022   Procedure: HYSTERECTOMY SUPRACERVICAL ABDOMINAL;  Surgeon: Lazaro Arms, MD;  Location: [facility];  Service: Gynecology;  Laterality: N/A;   WISDOM TOOTH EXTRACTION      Current Outpatient Medications:    meloxicam (MOBIC) 15 MG tablet, Take 1 tablet (15 mg total) by mouth daily., Disp: 30 tablet, Rfl: 3   methylPREDNISolone (MEDROL DOSEPAK) 4 MG TBPK tablet, 6 day dose pack - take as directed, Disp: 21 tablet, Rfl: 0   temazepam (RESTORIL) 30 MG capsule, Take 30  mg by mouth at bedtime., Disp: , Rfl:    cetirizine (ZYRTEC) 10 MG tablet, Take 10 mg by mouth daily as needed for allergies., Disp: , Rfl:    clonazePAM (KLONOPIN) 1 MG tablet, Take 1 mg by mouth daily as needed for anxiety (sleep)., Disp: , Rfl:    hydrochlorothiazide (HYDRODIURIL) 25 MG tablet, Take 25 mg by mouth daily., Disp: , Rfl:    ibuprofen (ADVIL) 800 MG tablet, Take 800 mg by mouth every 8 (eight) hours as needed for moderate pain or mild pain., Disp: , Rfl:    linaclotide (LINZESS) 145 MCG CAPS capsule, Take 145 mcg by mouth daily., Disp: , Rfl:    metoprolol succinate (TOPROL-XL) 50 MG 24 hr tablet, Take 1 tablet (50 mg total) by mouth at bedtime., Disp: 90 tablet, Rfl: 3   metroNIDAZOLE (METROGEL) 0.75 % vaginal gel, Place 1 Applicatorful vaginally at bedtime., Disp: 70 g, Rfl: 1   PROAIR HFA 108 (90 Base) MCG/ACT inhaler, Inhale 2 puffs into the lungs every 4 (four) hours as needed for wheezing or shortness of breath. , Disp: , Rfl:    SUMAtriptan (IMITREX) 100 MG tablet, Take 100 mg by mouth every 2 (two) hours as needed for migraine., Disp: , Rfl:   No Known Allergies Review of Systems Objective:  There were no vitals filed for this visit.  General: Well developed, nourished, in no acute distress, alert and oriented x3   Dermatological: Skin is warm,  dry and supple bilateral. Nails x 10 are well maintained; remaining integument appears unremarkable at this time. There are no open sores, no preulcerative lesions, no rash or signs of infection present.  Vascular: Dorsalis Pedis artery and Posterior Tibial artery pedal pulses are 2/4 bilateral with immedate capillary fill time. Pedal hair growth present. No varicosities and no lower extremity edema present bilateral.   Neruologic: Grossly intact via light touch bilateral. Vibratory intact via tuning fork bilateral. Protective threshold with Semmes Wienstein monofilament intact to all pedal sites bilateral. Patellar and Achilles  deep tendon reflexes 2+ bilateral. No Babinski or clonus noted bilateral.   Musculoskeletal: No gross boney pedal deformities bilateral. No pain, crepitus, or limitation noted with foot and ankle range of motion bilateral. Muscular strength 5/5 in all groups tested bilateral.  Pain to palpation medial calcaneal tubercle of the right heel.  Gait: Unassisted, Nonantalgic.    Radiographs:  Radiographs today demonstrate osseously mature individual good bone mineralization.  There is some old attempting spurring with a rectus foot type the soft tissue increase in density plantar calcaneal insertion site.  Assessment & Plan:   Assessment: Plantar fasciitis.  Plan: Injected the heel.  Started on methylprednisolone to be followed by meloxicam.  Follow-up with her in 1 month discussed appropriate shoe gear stretching exercise ice therapy sugar modifications.     Natalie Cordova, [place]

## 2023-05-01 ENCOUNTER — Ambulatory Visit: Payer: 59 | Admitting: Podiatry

## 2023-05-20 ENCOUNTER — Ambulatory Visit (HOSPITAL_COMMUNITY)
Admission: RE | Admit: 2023-05-20 | Discharge: 2023-05-20 | Disposition: A | Discharge status: Home / Self Care | Payer: 59 | Source: Ambulatory Visit | Attending: Internal Medicine | Admitting: Internal Medicine

## 2023-05-20 ENCOUNTER — Other Ambulatory Visit: Payer: Self-pay | Admitting: Adult Health

## 2023-05-20 ENCOUNTER — Ambulatory Visit (HOSPITAL_COMMUNITY): Payer: 59

## 2023-05-20 ENCOUNTER — Encounter (HOSPITAL_COMMUNITY): Payer: Self-pay

## 2023-05-20 VITALS — BP 144/84 | HR 75 | Temp 98.4°F | Resp 16

## 2023-05-20 DIAGNOSIS — Z1231 Encounter for screening mammogram for malignant neoplasm of breast: Secondary | ICD-10-CM

## 2023-05-20 DIAGNOSIS — M25561 Pain in right knee: Secondary | ICD-10-CM

## 2023-05-20 MED ORDER — KETOROLAC TROMETHAMINE 30 MG/ML IJ SOLN
INTRAMUSCULAR | Status: AC
  Filled 2023-05-20: qty 1

## 2023-05-20 MED ORDER — PREDNISONE 10 MG PO TABS: ORAL_TABLET | ORAL | 0 refills | Status: AC

## 2023-05-20 MED ORDER — DEXAMETHASONE SODIUM PHOSPHATE 10 MG/ML IJ SOLN
INTRAMUSCULAR | Status: AC
  Filled 2023-05-20: qty 1

## 2023-05-20 MED ORDER — DEXAMETHASONE SODIUM PHOSPHATE 10 MG/ML IJ SOLN
10.0000 mg | Freq: Once | INTRAMUSCULAR | Status: AC
  Administered 2023-05-20: 10 mg via INTRAMUSCULAR

## 2023-05-20 MED ORDER — KETOROLAC TROMETHAMINE 30 MG/ML IJ SOLN
30.0000 mg | Freq: Once | INTRAMUSCULAR | Status: AC
  Administered 2023-05-20: 30 mg via INTRAMUSCULAR

## 2023-05-20 NOTE — Discharge Instructions (Addendum)
Acute on chronic right knee pain likely secondary to flareup of existing mild arthritis.  X-ray done today.  This will be read by the radiologist today and the results will be available on MyChart but on brief overview there does not appear to be any obvious fractures. We will treat with the following: Toradol injection given to help with pain. Decadron (steroid) injection given to help with pain and inflammation Prednisone taper, 50 mg on day 1, 40 mg on day 2, 30 mg on day 3, 20 mg on day 4, 10 mg on day 5. Rest and ice the knee Schedule a follow-up appointment with orthopedics to discuss persistent right knee pain Return to urgent care or PCP if symptoms worsen or fail to resolve.

## 2023-05-20 NOTE — ED Triage Notes (Signed)
Pt states right knee pain and swelling for the past 8 days, denies any injury. States she is using ice,heat and ibuprofen at home with no relief.

## 2023-05-20 NOTE — ED Provider Notes (Signed)
[facility]    CSN: [id number] Arrival date & time: 05/20/23  1649      History   Chief Complaint Chief Complaint  Patient presents with   Knee Pain    HPI Natalie Cordova is a 47 y.o. female.   47 yr old female who presents to urgent care with about 8 days of right knee pain. No locking or giving out. The pain is worse with walking and bending. Has had swelling but no bruising. No injury to the area. Has had some problems with the knee for several months but nothing to this extent.  She did have an x-ray done in April due to persistent right knee pain.   Knee Pain Associated symptoms: no back pain and no fever     Past Medical History:  Diagnosis Date   Anxiety    Asthma    Contraceptive management 03/07/2014   Depression    Essential hypertension    related to anxiety and stress per patient   Fibroids 07/22/2017   Heart palpitations    Migraine headache    Osteoarthritis of both knees     Patient Active Problem List   Diagnosis Date Noted   Postcoital and contact bleeding 09/02/2022   S/P supracervical hysterectomy 01/09/22:  Post-operative state 09/02/2022   Routine Papanicolaou smear 09/02/2022   S/P abdominal supracervical subtotal hysterectomy 09/02/2022   BMI 40.0-44.9, adult ([facility]) 09/02/2022   Colon cancer screening 02/14/2022   Constipation 02/14/2022   Class 3 severe obesity without serious comorbidity with body mass index (BMI) of 40.0 to 44.9 in adult ([facility]) 02/14/2022   Dysmenorrhea    Fibroids, submucosal    Vaginal irritation 06/15/2020   Vaginal discharge 06/15/2020   Vaginal odor 06/15/2020   Scoliosis deformity of spine 05/25/2019   Accident while engaged in work-related activity 05/03/2019   Degeneration of lumbar intervertebral disc 05/03/2019   Low back pain 05/03/2019   Lumbar radiculopathy 05/03/2019   Menorrhagia with regular cycle 11/07/2017   Fibroid uterus 10/14/2017   Fibroids 07/22/2017   Heart palpitations  10/25/2016   Essential hypertension 10/25/2016   Encounter for gynecological examination with Papanicolaou smear of cervix 07/24/2016   Weight gain 07/24/2016   Body mass index 37.0-37.9, adult 07/24/2016   Weight loss counseling, encounter for 07/24/2016   Encounter for sterilization 07/24/2016   Pregnancy examination or test, negative result 05/29/2016   Irregular intermenstrual bleeding 05/29/2016   Contraceptive management 03/07/2014    Past Surgical History:  Procedure Laterality Date   COLONOSCOPY WITH PROPOFOL N/A 04/03/2022   Procedure: COLONOSCOPY WITH PROPOFOL;  Surgeon: Corbin Ade, MD;  [facility];  Service: Endoscopy;  Laterality: N/A;  7:30am   HYSTEROSCOPY N/A 11/07/2017   Procedure: HYSTEROSCOPY WITH HYDROTHERMAL ABLATION;  Surgeon: Tilda Burrow, MD;  Location: [facility];  Service: Gynecology;  Laterality: N/A;   LAPAROSCOPIC BILATERAL SALPINGECTOMY Bilateral 11/07/2017   Procedure: LAPAROSCOPIC BILATERAL SALPINGECTOMY;  Surgeon: Tilda Burrow, MD;  Location: [facility];  Service: Gynecology;  Laterality: Bilateral;   LEEP  1995   SUPRACERVICAL ABDOMINAL HYSTERECTOMY N/A 01/09/2022   Procedure: HYSTERECTOMY SUPRACERVICAL ABDOMINAL;  Surgeon: Lazaro Arms, MD;  Location: [facility];  Service: Gynecology;  Laterality: N/A;   WISDOM TOOTH EXTRACTION      OB History     Gravida  1   Para  1   Term  1   Preterm      AB      Living  1  SAB      IAB      Ectopic      Multiple      Live Births  1            Home Medications    Prior to Admission medications   Medication Sig Start Date End Date Taking? Authorizing Provider  cetirizine (ZYRTEC) 10 MG tablet Take 10 mg by mouth daily as needed for allergies. 03/19/22   [provider]  clonazePAM (KLONOPIN) 1 MG tablet Take 1 mg by mouth daily as needed for anxiety (sleep). 02/06/21   [provider]  hydrochlorothiazide (HYDRODIURIL) 25 MG tablet Take 25 mg by mouth  daily.    [provider]  ibuprofen (ADVIL) 800 MG tablet Take 800 mg by mouth every 8 (eight) hours as needed for moderate pain or mild pain.    [provider]  linaclotide (LINZESS) 145 MCG CAPS capsule Take 145 mcg by mouth daily. 02/19/22   [provider]  meloxicam (MOBIC) 15 MG tablet Take 1 tablet (15 mg total) by mouth daily. 04/03/23   Hyatt, Max T, DPM  methylPREDNISolone (MEDROL DOSEPAK) 4 MG TBPK tablet 6 day dose pack - take as directed 04/03/23   Hyatt, Max T, DPM  metoprolol succinate (TOPROL-XL) 50 MG 24 hr tablet Take 1 tablet (50 mg total) by mouth at bedtime. 10/19/19   Marykay Lex, MD  metroNIDAZOLE (METROGEL) 0.75 % vaginal gel Place 1 Applicatorful vaginally at bedtime. 09/02/22   Adline Potter, NP  PROAIR HFA 108 [phone] Base) MCG/ACT inhaler Inhale 2 puffs into the lungs every 4 (four) hours as needed for wheezing or shortness of breath.  09/18/16   [provider]  SUMAtriptan (IMITREX) 100 MG tablet Take 100 mg by mouth every 2 (two) hours as needed for migraine. 08/23/16   [provider]  temazepam (RESTORIL) 30 MG capsule Take 30 mg by mouth at bedtime. 02/11/23   [provider]    Family History Family History  Problem Relation Age of Onset   Breast cancer Mother    Healthy Mother    Healthy Father    Colon polyps Father        26s   Healthy Brother    Cancer Maternal Grandfather        lung   Breast cancer Maternal Aunt    Breast cancer Maternal Aunt    Colon cancer Neg Hx     Social History Social History   Tobacco Use   Smoking status: Never   Smokeless tobacco: Never  Vaping Use   Vaping status: Never Used  Substance Use Topics   Alcohol use: Yes    Comment: occ beer or wine   Drug use: No     Allergies   Patient has no known allergies.   Review of Systems Review of Systems  Constitutional:  Negative for chills and fever.  HENT:  Negative for ear pain and sore throat.   Eyes:   Negative for pain and visual disturbance.  Respiratory:  Negative for cough and shortness of breath.   Cardiovascular:  Negative for chest pain and palpitations.  Gastrointestinal:  Negative for abdominal pain and vomiting.  Genitourinary:  Negative for dysuria and hematuria.  Musculoskeletal:  Negative for arthralgias and back pain.       Right knee pain and swelling  Skin:  Negative for color change and rash.  Neurological:  Negative for seizures and syncope.  All other systems reviewed  and are negative.    Physical Exam Triage Vital Signs ED Triage Vitals  Encounter Vitals Group     BP 05/20/23 1706 (!) 144/84     Systolic BP Percentile --      Diastolic BP Percentile --      Pulse Rate 05/20/23 1706 75     Resp 05/20/23 1706 16     Temp 05/20/23 1706 98.4 F (36.9 C)     Temp Source 05/20/23 1706 Oral     SpO2 05/20/23 1706 99 %     Weight --      Height --      Head Circumference --      Peak Flow --      Pain Score 05/20/23 1707 7     Pain Loc --      Pain Education --      Exclude from Growth Chart --    No data found.  Updated Vital Signs BP (!) 144/84 (BP Location: Right Arm)   Pulse 75   Temp 98.4 F (36.9 C) (Oral)   Resp 16   LMP 10/26/2021   SpO2 99%   Visual Acuity Right Eye Distance:   Left Eye Distance:   Bilateral Distance:    Right Eye Near:   Left Eye Near:    Bilateral Near:     Physical Exam Vitals and nursing note reviewed.  Constitutional:      General: She is not in acute distress.    Appearance: She is well-developed.  HENT:     Head: Normocephalic and atraumatic.  Eyes:     Conjunctiva/sclera: Conjunctivae normal.  Cardiovascular:     Rate and Rhythm: Normal rate and regular rhythm.     Heart sounds: No murmur heard. Pulmonary:     Effort: Pulmonary effort is normal. No respiratory distress.     Breath sounds: Normal breath sounds.  Abdominal:     Palpations: Abdomen is soft.     Tenderness: There is no abdominal  tenderness.  Musculoskeletal:        General: No swelling.     Cervical back: Neck supple.     Right knee: Swelling present. No deformity or effusion. Decreased range of motion. Tenderness present over the medial joint line.       Legs:  Skin:    General: Skin is warm and dry.     Capillary Refill: Capillary refill takes less than 2 seconds.  Neurological:     Mental Status: She is alert.  Psychiatric:        Mood and Affect: Mood normal.      UC Treatments / Results  Labs (all labs ordered are listed, but only abnormal results are displayed) Labs Reviewed - No data to display  EKG   Radiology No results found.  Procedures Procedures (including critical care time)  Medications Ordered in UC Medications - No data to display  Initial Impression / Assessment and Plan / UC Course  I have reviewed the triage vital signs and the nursing notes.  Pertinent labs & imaging results that were available during my care of the patient were reviewed by me and considered in my medical decision making (see chart for details).     Acute pain of right knee   Acute on chronic right knee pain likely secondary to flareup of existing mild arthritis.  X-ray done today.  This will be read by the radiologist today and the results will be available on MyChart but on brief  overview there does not appear to be any obvious fractures. We will treat with the following: Toradol injection given to help with pain. Decadron (steroid) injection given to help with pain and inflammation Prednisone taper, 50 mg on day 1, 40 mg on day 2, 30 mg on day 3, 20 mg on day 4, 10 mg on day 5. Rest and ice the knee Schedule a follow-up appointment with orthopedics to discuss persistent right knee pain Return to urgent care or PCP if symptoms worsen or fail to resolve.   Final Clinical Impressions(s) / UC Diagnoses   Final diagnoses:  None   Discharge Instructions   None    ED Prescriptions   None     PDMP not reviewed this encounter.   Landis Martins, [place] 05/20/23 1826

## 2023-05-30 ENCOUNTER — Other Ambulatory Visit: Payer: Self-pay

## 2023-05-30 ENCOUNTER — Ambulatory Visit: Payer: 59 | Admitting: Sports Medicine

## 2023-05-30 ENCOUNTER — Encounter: Payer: Self-pay | Admitting: Sports Medicine

## 2023-05-30 DIAGNOSIS — M25461 Effusion, right knee: Secondary | ICD-10-CM

## 2023-05-30 DIAGNOSIS — M25561 Pain in right knee: Secondary | ICD-10-CM

## 2023-05-30 DIAGNOSIS — M1711 Unilateral primary osteoarthritis, right knee: Secondary | ICD-10-CM | POA: Diagnosis not present

## 2023-05-30 MED ORDER — METHYLPREDNISOLONE ACETATE 40 MG/ML IJ SUSP
80.0000 mg | INTRAMUSCULAR | Status: AC | PRN
  Administered 2023-05-30: 80 mg via INTRA_ARTICULAR

## 2023-05-30 MED ORDER — BUPIVACAINE HCL 0.25 % IJ SOLN
2.0000 mL | INTRAMUSCULAR | Status: AC | PRN
  Administered 2023-05-30: 2 mL via INTRA_ARTICULAR

## 2023-05-30 MED ORDER — LIDOCAINE HCL 1 % IJ SOLN
1.0000 mL | INTRAMUSCULAR | Status: AC | PRN
  Administered 2023-05-30: 1 mL

## 2023-05-30 NOTE — Progress Notes (Signed)
Natalie Cordova - 47 y.o. female MRN [id number]  Date of birth: 01-07-1976  Office Visit Note: Visit Date: 05/30/2023 PCP: Dillard Essex, NP Referred by: Madelyn Brunner, DO  Subjective: Chief Complaint  Patient presents with   Right Knee - Pain   HPI: Natalie Cordova is a pleasant 47 y.o. female who presents today for acute left knee pain and swelling.  She has had rather significant pain and swelling in the right knee for the last 2 weeks.  She [place] that she feels like prior about the pain and swelling.  No specific injury.  She was seen at the urgent care where they gave her both an IM injection and placed her on an oral prednisone pack.  She has completed this.  She got about 2 days of relief and then her pain returned.  She has stiffness and aching pain within the knee, worse on the medial compartment.   Note reviewed from [facility] urgent care from 05/20/2023 -x-ray with mild to moderate osteoarthritic change. Treated with Toradol and Decadron IM injection as well as a prednisone taper for 5 days.  Pertinent ROS were reviewed with the patient and found to be negative unless otherwise specified above in HPI.   Assessment & Plan: Visit Diagnoses:  1. Pain and swelling of right knee   2. Unilateral primary osteoarthritis, right knee   3. Effusion, right knee    Plan: Discussed with Toryn the nature of her pain and swelling of the right knee which is an exacerbation of her chronic underlying osteoarthritis with concomitant knee effusion. Bedside ultrasound does show a moderate effusion within the knee. Through shared decision-making, we did proceed with ultrasound-guided aspiration and subsequent CSI which patient tolerated well and had good relief following anesthetic portion. She has completed and will discontinue her oral prednisone. She may use either her meloxicam 15 mg once daily or use oral ibuprofen as needed for pain control.   Discussed postinjection protocol with using ice and rest for the first 48 to 72 hours.  I would like to see her back in about 1 month for reevaluation.  Follow-up: Return in about 1 month (around 06/29/2023) for for R-knee.   Meds & Orders: No orders of the defined types were placed in this encounter.   Orders Placed This Encounter  Procedures   Large Joint Inj   [place] Extrem Low Right Ltd     Procedures: Large Joint Inj: R knee on 05/30/2023 2:52 PM Indications: joint swelling and pain Details: 18 G 1.5 in needle, superolateral approach Medications: 1 mL lidocaine 1 %; 2 mL bupivacaine 0.25 %; 80 mg methylPREDNISolone acetate 40 MG/ML Aspirate: clear and yellow; sent for lab analysis Outcome: tolerated well, no immediate complications  US-guided Knee Aspiration, Right: After discussion on risks/benefits/indications was provided, informed verbal consent was obtained and a timeout was performed, patient was lying supine on exam table. The knee was prepped with alcohol swab.  Utilizing superolateral approach, approximately 5 mL of lidocaine 1% was used for local anesthesia. Then using an 18g needle on 25cc syringe, 21 mL of clear straw-colored fluid was aspirated from the knee. Utilizing the same portal, the knee joint was then injected with 1:2:2 lidocaine:bupivicaine:depomedrol.  Patient tolerated procedure well without immediate complications.  Procedure, treatment alternatives, risks and benefits explained, specific risks discussed. Consent was given by the patient. Immediately prior to procedure a time out was called to verify the correct patient, procedure,  equipment, support staff and site/side marked as required. Patient was prepped and draped in the usual sterile fashion.          Clinical History: No specialty comments available.  She reports that she has never smoked. She has never used smokeless tobacco. No results for input(s): "HGBA1C", "LABURIC" in the last 8760  hours.  Objective:   Vital Signs: LMP 10/26/2021   Physical Exam  Gen: Well-appearing, in no acute distress; non-toxic CV: Well-perfused. Warm.  Resp: Breathing unlabored on room air; no wheezing. Psych: Fluid speech in conversation; appropriate affect; normal thought process Neuro: Sensation intact throughout. No gross coordination deficits.   Ortho Exam - Right knee: Evaluation demonstrates a small to moderate effusion on the knee with some warmth.  No redness or skin changes.  There is pain at endrange flexion with range of motion from 0-125 degrees compared to 0-135 degrees of the contralateral knee.  There is positive TTP over the medial joint line.  Negative Lachman, anterior/posterior drawer, negative McMurray's testing.  Imaging:  *Independent review and interpretation of right knee x-rays, 4 view AP lateral and obliques from 05/20/2023 show mild to moderate osteoarthritis in the medial tibiofemoral compartment and mild patellofemoral arthralgia.  Likely small to moderate joint effusion.  No acute bony fracture otherwise noted.  [place] Extrem Low Right Ltd Limited musculoskeletal ultrasound of the right knee was performed today.   Evaluation of the superior patella shows no cortical regularity.  There is  overlying quadricep tendon without tear.  The suprapatellar bursa was seen  with a moderate effusion noted both in short and long axis.  *Technically successful ultrasound-guided knee aspiration and subsequent  injection.   Past Medical/Family/Surgical/Social History: Medications & Allergies reviewed per EMR, new medications updated. Patient Active Problem List   Diagnosis Date Noted   Postcoital and contact bleeding 09/02/2022   S/P supracervical hysterectomy 01/09/22:  Post-operative state 09/02/2022   Routine Papanicolaou smear 09/02/2022   S/P abdominal supracervical subtotal hysterectomy 09/02/2022   BMI 40.0-44.9, adult (HCC) 09/02/2022   Colon cancer screening  02/14/2022   Constipation 02/14/2022   Class 3 severe obesity without serious comorbidity with body mass index (BMI) of 40.0 to 44.9 in adult [facility]) 02/14/2022   Dysmenorrhea    Fibroids, submucosal    Vaginal irritation 06/15/2020   Vaginal discharge 06/15/2020   Vaginal odor 06/15/2020   Scoliosis deformity of spine 05/25/2019   Accident while engaged in work-related activity 05/03/2019   Degeneration of lumbar intervertebral disc 05/03/2019   Low back pain 05/03/2019   Lumbar radiculopathy 05/03/2019   Menorrhagia with regular cycle 11/07/2017   Fibroid uterus 10/14/2017   Fibroids 07/22/2017   Heart palpitations 10/25/2016   Essential hypertension 10/25/2016   Encounter for gynecological examination with Papanicolaou smear of cervix 07/24/2016   Weight gain 07/24/2016   Body mass index 37.0-37.9, adult 07/24/2016   Weight loss counseling, encounter for 07/24/2016   Encounter for sterilization 07/24/2016   Pregnancy examination or test, negative result 05/29/2016   Irregular intermenstrual bleeding 05/29/2016   Contraceptive management 03/07/2014   Past Medical History:  Diagnosis Date   Anxiety    Asthma    Contraceptive management 03/07/2014   Depression    Essential hypertension    related to anxiety and stress per patient   Fibroids 07/22/2017   Heart palpitations    Migraine headache    Osteoarthritis of both knees    Family History  Problem Relation Age of Onset   Breast cancer Mother  Healthy Mother    Healthy Father    Colon polyps Father        10s   Healthy Brother    Cancer Maternal Grandfather        lung   Breast cancer Maternal Aunt    Breast cancer Maternal Aunt    Colon cancer Neg Hx    Past Surgical History:  Procedure Laterality Date   COLONOSCOPY WITH PROPOFOL N/A 04/03/2022   Procedure: COLONOSCOPY WITH PROPOFOL;  Surgeon: Corbin Ade, MD;  [facility];  Service: Endoscopy;  Laterality: N/A;  7:30am   HYSTEROSCOPY N/A  11/07/2017   Procedure: HYSTEROSCOPY WITH HYDROTHERMAL ABLATION;  Surgeon: Tilda Burrow, MD;  Location: [facility];  Service: Gynecology;  Laterality: N/A;   LAPAROSCOPIC BILATERAL SALPINGECTOMY Bilateral 11/07/2017   Procedure: LAPAROSCOPIC BILATERAL SALPINGECTOMY;  Surgeon: Tilda Burrow, MD;  Location: [facility];  Service: Gynecology;  Laterality: Bilateral;   LEEP  1995   SUPRACERVICAL ABDOMINAL HYSTERECTOMY N/A 01/09/2022   Procedure: HYSTERECTOMY SUPRACERVICAL ABDOMINAL;  Surgeon: Lazaro Arms, MD;  Location: [facility];  Service: Gynecology;  Laterality: N/A;   WISDOM TOOTH EXTRACTION     Social History   Occupational History   Not on file  Tobacco Use   Smoking status: Never   Smokeless tobacco: Never  Vaping Use   Vaping status: Never Used  Substance and Sexual Activity   Alcohol use: Yes    Comment: occ beer or wine   Drug use: No   Sexual activity: Yes    Birth control/protection: Surgical    Comment: [facility]

## 2023-05-30 NOTE — Progress Notes (Signed)
Patient says that she has had significant knee pain and swelling for the last two weeks after walking at the fair for two weekends in a row. She says that she went to Urgent Care where they gave her two steroid injections into the glute and a Prednisone pack. She did not get relief from these. She says that she continued taking Ibuprofen after finishing the Prednisone pack and it provides minimal relief. She says she alternates with ice and heat. Patient says that pain is dull and achy, and is worst anytime she has to bend the knee. She says that lately she has had tenderness on the inside of the knee (medial aspect).

## 2023-06-05 ENCOUNTER — Encounter: Payer: Self-pay | Admitting: Podiatry

## 2023-06-05 ENCOUNTER — Ambulatory Visit: Payer: 59 | Admitting: Podiatry

## 2023-06-05 DIAGNOSIS — M722 Plantar fascial fibromatosis: Secondary | ICD-10-CM | POA: Diagnosis not present

## 2023-06-05 MED ORDER — TRIAMCINOLONE ACETONIDE 40 MG/ML IJ SUSP
20.0000 mg | Freq: Once | INTRAMUSCULAR | Status: AC
  Administered 2023-06-05: 20 mg

## 2023-06-05 NOTE — Progress Notes (Signed)
She presents today for follow-up of her right plantar fasciitis states this flared up again numbness in the last appointment because I was sick recently had swelling of the knee where they drew out fluid and injected cortisone.  States that [place] and she was not wearing the best if she is at least 1 of those times.  Objective: Vital signs stable alert oriented x 3.  Pulses are palpable.  There is no erythema edema cellulitis drainage or odor she has significant pain on palpation medial calcaneal tubercle no pain on palpation of the central calcaneal tubercle and Achilles or the lateral foot.  Assessment: Plantar fasciitis right.  Plan: Reinjected the right foot today with 2 mg Kenalog 5 mg Marcaine point maximal tenderness right.  Tolerated procedure well without complications follow-up with in 1 month if necessary

## 2023-06-30 ENCOUNTER — Ambulatory Visit: Payer: 59 | Admitting: Sports Medicine

## 2023-06-30 ENCOUNTER — Encounter: Payer: Self-pay | Admitting: Sports Medicine

## 2023-06-30 DIAGNOSIS — M2141 Flat foot [pes planus] (acquired), right foot: Secondary | ICD-10-CM | POA: Diagnosis not present

## 2023-06-30 DIAGNOSIS — M2142 Flat foot [pes planus] (acquired), left foot: Secondary | ICD-10-CM

## 2023-06-30 DIAGNOSIS — M1711 Unilateral primary osteoarthritis, right knee: Secondary | ICD-10-CM | POA: Diagnosis not present

## 2023-06-30 DIAGNOSIS — M222X1 Patellofemoral disorders, right knee: Secondary | ICD-10-CM

## 2023-06-30 DIAGNOSIS — M222X2 Patellofemoral disorders, left knee: Secondary | ICD-10-CM

## 2023-06-30 NOTE — Progress Notes (Signed)
Patient says she feels "100 times better." She says that the swelling seems to have gone completely away and most of the pain is gone as well. She says she does still have trouble going up and down stairs, or if she makes a quick wrong move, but this seems more like weakness. Patient says that she had quick relief after her last visit.

## 2023-06-30 NOTE — Progress Notes (Signed)
Natalie Cordova - 47 y.o. female MRN [id number]  Date of birth: 08-27-1975  Office Visit Note: Visit Date: 06/30/2023 PCP: Dillard Essex, NP Referred by: Dillard Essex, NP  Subjective: Chief Complaint  Patient presents with   Right Knee - Follow-up   HPI: Natalie Cordova is a pleasant 47 y.o. female who presents today for follow-up of chronic bilateral knee pain - f/u right knee pain.  1 month ago we did proceed with ultrasound-guided knee aspiration and subsequent injection.  This significantly helped her knee as it feels much better and she has not had a reoccurrence of her swelling.  She still does have some pain of both knees as well as reported crepitus with knee flexion and bending.  The right knee is worse than the left and at times will feel a sharp pain if making a quick turn.  Also pain with going up and down steps.  Feels like the right knee is slightly weaker.  She is not doing any formalized physical therapy or rehab in the past.  She does use meloxicam 15 mg daily as needed.  She has been seeing podiatry for plantar fascia pain, recently had a second plantar fascia injection which did not give her any relief.  She is wondering if this is related.  Pertinent ROS were reviewed with the patient and found to be negative unless otherwise specified above in HPI.   Assessment & Plan: Visit Diagnoses:  1. Unilateral primary osteoarthritis, right knee   2. Patellofemoral pain syndrome of both knees   3. Pes planus of both feet    Plan: Natalie Cordova had marked improvement from her knee aspiration and subsequent injection.  She has not had recurrence of her swelling but is dealing more so with pain about the patellofemoral joint of both knees.  She does have some VMO and balance needs to work on quadricep and knee stabilization strengthening.  We will send a formal PT referral for both of her knees.  We will see how she improves over the next 6 weeks, if she is having any  instability or not finding improvement, could consider repeated standing x-rays versus MRI of the right knee.  I do feel the patient would benefit from a more rigid and supportive orthotic given her pes planus, she does have follow-up with podiatry tomorrow which she will discuss this and additional treatment options.  She will continue meloxicam 15 mg once daily as needed.  Follow-up in 2 months.  Follow-up: Return in about 2 months (around 08/31/2023) for For R > L knee .   Meds & Orders: No orders of the defined types were placed in this encounter.   Orders Placed This Encounter  Procedures   Ambulatory referral to Physical Therapy     Procedures: No procedures performed      Clinical History: No specialty comments available.  She reports that she has never smoked. She has never used smokeless tobacco. No results for input(s): "HGBA1C", "LABURIC" in the last 8760 hours.  Objective:   Vital Signs: LMP 10/26/2021   Physical Exam  Gen: Well-appearing, in no acute distress; non-toxic CV: Well-perfused. Warm.  Resp: Breathing unlabored on room air; no wheezing. Psych: Fluid speech in conversation; appropriate affect; normal thought process Neuro: Sensation intact throughout. No gross coordination deficits.   Ortho Exam - Bilateral knees: No swelling redness or effusion.  Range of motion is well-preserved with the right knee 0-130 degrees, left knee 0-135 degrees.  There is some very  trace TTP over the medial joint line of the right knee.  Ligamentously intact.  There is a degree of valgus formation from her wider Q angle.  Positive patellar crepitus bilaterally.  - Feet/gait: There is mild to moderate pes planus which worsens upon standing.  Patient does walk with a slightly antalgic gait with her right heel over the lateral column, avoiding pronation.  Imaging: No results found.  Past Medical/Family/Surgical/Social History: Medications & Allergies reviewed per EMR, new medications  updated. Patient Active Problem List   Diagnosis Date Noted   Postcoital and contact bleeding 09/02/2022   S/P supracervical hysterectomy 01/09/22:  Post-operative state 09/02/2022   Routine Papanicolaou smear 09/02/2022   S/P abdominal supracervical subtotal hysterectomy 09/02/2022   BMI 40.0-44.9, adult (HCC) 09/02/2022   Colon cancer screening 02/14/2022   Constipation 02/14/2022   Class 3 severe obesity without serious comorbidity with body mass index (BMI) of 40.0 to 44.9 in adult [facility]) 02/14/2022   Dysmenorrhea    Fibroids, submucosal    Vaginal irritation 06/15/2020   Vaginal discharge 06/15/2020   Vaginal odor 06/15/2020   Scoliosis deformity of spine 05/25/2019   Accident while engaged in work-related activity 05/03/2019   Degeneration of lumbar intervertebral disc 05/03/2019   Low back pain 05/03/2019   Lumbar radiculopathy 05/03/2019   Menorrhagia with regular cycle 11/07/2017   Fibroid uterus 10/14/2017   Fibroids 07/22/2017   Heart palpitations 10/25/2016   Essential hypertension 10/25/2016   Encounter for gynecological examination with Papanicolaou smear of cervix 07/24/2016   Weight gain 07/24/2016   Body mass index 37.0-37.9, adult 07/24/2016   Weight loss counseling, encounter for 07/24/2016   Encounter for sterilization 07/24/2016   Pregnancy examination or test, negative result 05/29/2016   Irregular intermenstrual bleeding 05/29/2016   Contraceptive management 03/07/2014   Past Medical History:  Diagnosis Date   Anxiety    Asthma    Contraceptive management 03/07/2014   Depression    Essential hypertension    related to anxiety and stress per patient   Fibroids 07/22/2017   Heart palpitations    Migraine headache    Osteoarthritis of both knees    Family History  Problem Relation Age of Onset   Breast cancer Mother    Healthy Mother    Healthy Father    Colon polyps Father        17s   Healthy Brother    Cancer Maternal Grandfather        lung    Breast cancer Maternal Aunt    Breast cancer Maternal Aunt    Colon cancer Neg Hx    Past Surgical History:  Procedure Laterality Date   COLONOSCOPY WITH PROPOFOL N/A 04/03/2022   Procedure: COLONOSCOPY WITH PROPOFOL;  Surgeon: Corbin Ade, MD;  [facility];  Service: Endoscopy;  Laterality: N/A;  7:30am   HYSTEROSCOPY N/A 11/07/2017   Procedure: HYSTEROSCOPY WITH HYDROTHERMAL ABLATION;  Surgeon: Tilda Burrow, MD;  Location: [facility];  Service: Gynecology;  Laterality: N/A;   LAPAROSCOPIC BILATERAL SALPINGECTOMY Bilateral 11/07/2017   Procedure: LAPAROSCOPIC BILATERAL SALPINGECTOMY;  Surgeon: Tilda Burrow, MD;  Location: [facility];  Service: Gynecology;  Laterality: Bilateral;   LEEP  1995   SUPRACERVICAL ABDOMINAL HYSTERECTOMY N/A 01/09/2022   Procedure: HYSTERECTOMY SUPRACERVICAL ABDOMINAL;  Surgeon: Lazaro Arms, MD;  Location: [facility];  Service: Gynecology;  Laterality: N/A;   WISDOM TOOTH EXTRACTION     Social History   Occupational History   Not on file  Tobacco  Use   Smoking status: Never   Smokeless tobacco: Never  Vaping Use   Vaping status: Never Used  Substance and Sexual Activity   Alcohol use: Yes    Comment: occ beer or wine   Drug use: No   Sexual activity: Yes    Birth control/protection: Surgical    Comment: [facility]

## 2023-07-01 ENCOUNTER — Encounter: Payer: Self-pay | Admitting: Podiatry

## 2023-07-01 ENCOUNTER — Ambulatory Visit: Payer: 59 | Admitting: Podiatry

## 2023-07-01 DIAGNOSIS — S93691A Other sprain of right foot, initial encounter: Secondary | ICD-10-CM

## 2023-07-01 NOTE — Progress Notes (Signed)
She presents today for follow-up of her plantar fasciitis states that at this point the injection really did not work and made it better for about a day then it went right back to [employer] if not even worse.  She states that has been hurting worse ever since that time and I feel that is getting weaker.  Objective: Vital signs are stable alert oriented x 3.  Pulses are palpable.  She has severe pain on palpation medial band of the plantar fascia particularly near its insertion site the right heel.  Is exquisitely tender and the ligament feels much more lax than it did previously.  Assessment: Probable tear of the plantar fascia right foot.  Plan: Currently requesting MRI of the right foot and placed her in a cam boot.  I MRI is for differential diagnosis and surgical consideration right rear foot and ankle.

## 2023-07-04 ENCOUNTER — Telehealth: Payer: Self-pay

## 2023-07-04 NOTE — Telephone Encounter (Signed)
How long will it be extended for.

## 2023-07-04 NOTE — Telephone Encounter (Signed)
Patient called and left message wanting to know if she can have her note extended.

## 2023-07-07 ENCOUNTER — Ambulatory Visit: Payer: 59 | Admitting: Physician Assistant

## 2023-07-07 ENCOUNTER — Encounter: Payer: Self-pay | Admitting: Physician Assistant

## 2023-07-07 ENCOUNTER — Encounter: Payer: Self-pay | Admitting: Podiatry

## 2023-07-07 VITALS — BP 118/77 | HR 72 | Temp 97.5°F | Ht 65.5 in | Wt 260.4 lb

## 2023-07-07 DIAGNOSIS — M722 Plantar fascial fibromatosis: Secondary | ICD-10-CM

## 2023-07-07 DIAGNOSIS — F419 Anxiety disorder, unspecified: Secondary | ICD-10-CM

## 2023-07-07 DIAGNOSIS — J4520 Mild intermittent asthma, uncomplicated: Secondary | ICD-10-CM

## 2023-07-07 DIAGNOSIS — I10 Essential (primary) hypertension: Secondary | ICD-10-CM | POA: Diagnosis not present

## 2023-07-07 DIAGNOSIS — Z8669 Personal history of other diseases of the nervous system and sense organs: Secondary | ICD-10-CM

## 2023-07-07 NOTE — Progress Notes (Signed)
Patient ID: Natalie Cordova, female    DOB: [birth year], 47 y.o.   MRN: [id number]   Assessment & Plan:  Plantar fasciitis, right  Mild intermittent asthma without complication  Essential hypertension  Anxiety  History of migraine   Assessment and Plan    Plantar Fasciitis Right foot pain since July. Cortisone shots provided temporary relief. Currently in a boot and awaiting MRI. -Continue current management and await MRI results.  Asthma Occasional flare-ups, most recently with bronchitis in the summer. Has an inhaler for use during flare-ups. -Continue current management with inhaler as needed.  Hypertension Diagnosed around 2009-2010. Currently well controlled on Toprol and hydrochlorothiazide. -Continue Toprol and hydrochlorothiazide.  Migraines Reduced frequency in recent years. Has emergency medication on standby. -Continue current management with emergency medication as needed.  Anxiety Has Klonopin for emergency panic if needed, but has not needed to use it recently. -Continue current management with Klonopin as needed.  Insomnia Has temazepam for sleep as needed. -Continue temazepam as needed.  Weight Management Expressed desire to lose weight and improve health in the coming year. -Discussed potential strategies including exercise (considering foot pain) and diet. Discussed potential for medication if necessary and covered by insurance.  General Health Maintenance / Followup Plans -Schedule physical exam and routine labs in January/February 2025. -Continue current medications, no refills needed at this time. -Encouraged to reach out via MyChart or call office as needed.           Return in about 4 weeks (around 08/04/2023) for physical / fasting labs .    Subjective:    Chief Complaint  Patient presents with   New Patient (Initial Visit)    New Pt in office to establish care with PCP; pt dealing with plantar fascitis in LLE wearing boot  until having Mri to see if something is torn per patient; pt has no concerns to address;     HPI Discussed the use of AI scribe software for clinical note transcription with the patient, who gave verbal consent to proceed.  History of Present Illness   The patient, with a history of anxiety, asthma, hypertension, and migraines, presents as a new patient after her previous primary care provider retired. The patient has been dealing with plantar fasciitis in the right foot since July, which she initially attributed to wearing steel-toed shoes for work. Despite receiving two cortisone shots, the pain persisted, leading to the patient being put in a boot last week. An MRI is scheduled to further investigate the issue.  The patient's asthma flares up occasionally, most recently last fall when she had bronchitis. She uses an inhaler to manage these flare-ups. Her hypertension, diagnosed around 2009 or 2010, is well-controlled with Toprol and hydrochlorothiazide. The patient's migraines have improved significantly over the past couple of years, with the patient attributing some of this improvement to a piercing. She has medication on standby for when migraines do occur.  The patient also mentions struggling with her weight, particularly due to current limitations from the plantar fasciitis. She has previously tried phentermine but did not like the way it made her feel. Her goal for the coming year is to lose weight and adopt a healthier lifestyle.       Past Medical History:  Diagnosis Date   Anxiety    Asthma    Contraceptive management 03/07/2014   Depression    Essential hypertension    related to anxiety and stress per patient   Fibroids 07/22/2017   Heart  palpitations    Migraine headache    Osteoarthritis of both knees    Plantar fasciitis of right foot     Past Surgical History:  Procedure Laterality Date   COLONOSCOPY WITH PROPOFOL N/A 04/03/2022   Procedure: COLONOSCOPY WITH  PROPOFOL;  Surgeon: Corbin Ade, MD;  [facility]: [facility];  Service: Endoscopy;  Laterality: N/A;  7:30am   HYSTEROSCOPY N/A 11/07/2017   Procedure: HYSTEROSCOPY WITH HYDROTHERMAL ABLATION;  Surgeon: Tilda Burrow, MD;  Location: [facility];  Service: Gynecology;  Laterality: N/A;   LAPAROSCOPIC BILATERAL SALPINGECTOMY Bilateral 11/07/2017   Procedure: LAPAROSCOPIC BILATERAL SALPINGECTOMY;  Surgeon: Tilda Burrow, MD;  Location: [facility];  Service: Gynecology;  Laterality: Bilateral;   LEEP  1995   SUPRACERVICAL ABDOMINAL HYSTERECTOMY N/A 01/09/2022   Procedure: HYSTERECTOMY SUPRACERVICAL ABDOMINAL;  Surgeon: Lazaro Arms, MD;  Location: [facility];  Service: Gynecology;  Laterality: N/A;   WISDOM TOOTH EXTRACTION      Family History  Problem Relation Age of Onset   Breast cancer Mother    Healthy Mother    Hypertension Mother    Healthy Father    Colon polyps Father        12s   High Cholesterol Father    Healthy Brother    Breast cancer Maternal Aunt    Breast cancer Maternal Aunt    Cancer Maternal Grandfather        lung   Colon cancer Neg Hx     Social History   Tobacco Use   Smoking status: Never   Smokeless tobacco: Never  Vaping Use   Vaping status: Never Used  Substance Use Topics   Alcohol use: Yes    Comment: occ beer or wine   Drug use: No     No Known Allergies  Review of Systems NEGATIVE UNLESS OTHERWISE INDICATED IN HPI      Objective:     BP 118/77 (BP Location: Left Arm, Patient Position: Sitting)   Pulse 72   Temp (!) 97.5 F (36.4 C) (Temporal)   Ht 5' 5.5" (1.664 m)   Wt 260 lb 6.4 oz (118.1 kg)   LMP 10/26/2021   SpO2 100%   BMI 42.67 kg/m   Wt Readings from Last 3 Encounters:  07/07/23 260 lb 6.4 oz (118.1 kg)  09/02/22 257 lb (116.6 kg)  04/01/22 247 lb (112 kg)    BP Readings from Last 3 Encounters:  07/07/23 118/77  05/20/23 (!) 144/84  09/02/22 133/85     Physical Exam Vitals and nursing note reviewed.   Constitutional:      Appearance: Normal appearance. She is obese.  Cardiovascular:     Rate and Rhythm: Normal rate and regular rhythm.     Pulses: Normal pulses.     Heart sounds: Normal heart sounds. No murmur heard. Pulmonary:     Effort: Pulmonary effort is normal.     Breath sounds: Normal breath sounds.  Musculoskeletal:     Comments: RLE boot  Neurological:     General: No focal deficit present.     Mental Status: She is alert and oriented to person, place, and time.  Psychiatric:        Mood and Affect: Mood normal.        Behavior: Behavior normal.          Time Spent: 34 minutes of total time was spent on the date of the encounter performing the following actions: chart review prior to seeing the patient,  obtaining history, performing a medically necessary exam, counseling on the treatment plan, placing orders, and documenting in our EHR.       Torrey Ballinas M Albertina Leise, PA-C

## 2023-07-07 NOTE — Telephone Encounter (Signed)
Spoke with patient and she would like note to be extended until 07/28/23. Note done and sent to [facility] at [phone]

## 2023-07-07 NOTE — Patient Instructions (Signed)
VISIT SUMMARY:  You visited [place] today to discuss several ongoing health issues, including plantar fasciitis, asthma, hypertension, migraines, anxiety, insomnia, and weight management. We reviewed your current treatments and made plans for continued care and follow-up.  YOUR PLAN:  -PLANTAR FASCIITIS: Plantar fasciitis is inflammation of the tissue on the bottom of your foot, causing heel pain. You are currently managing this with a boot and awaiting MRI results to further investigate the issue.  -ASTHMA: Asthma is a condition where your airways narrow and swell, causing difficulty in breathing. You should continue using your inhaler as needed for flare-ups.  -HYPERTENSION: Hypertension, or high blood pressure, is a condition where the force of your blood against your artery walls is too high. Your condition is well-controlled with Toprol and hydrochlorothiazide, and you should continue these medications.  -MIGRAINES: Migraines are severe headaches often accompanied by nausea and sensitivity to light. Your migraines have reduced in frequency, and you should continue using your emergency medication as needed.  -ANXIETY: Anxiety is a feeling of worry or fear that can be intense. You have Klonopin for emergency panic situations, but you have not needed to use it recently. Continue with this management plan.  -INSOMNIA: Insomnia is difficulty falling or staying asleep. You have temazepam to use as needed for sleep.  -WEIGHT MANAGEMENT: You expressed a desire to lose weight and improve your health. We discussed strategies including exercise (considering your foot pain) and diet, and the potential for medication if necessary and covered by insurance.  -GENERAL HEALTH MAINTENANCE: We will schedule a physical exam and routine labs in January/February 2025. Continue your current medications, and feel free to reach out via MyChart or call the office as needed.  INSTRUCTIONS:  Please continue with your  current treatments and medications. We will schedule a physical exam and routine labs in January/February 2025. If you have any questions or need assistance, do not hesitate to reach out via MyChart or call our office.

## 2023-07-14 ENCOUNTER — Ambulatory Visit: Payer: 59

## 2023-07-22 ENCOUNTER — Ambulatory Visit
Admission: RE | Admit: 2023-07-22 | Discharge: 2023-07-22 | Disposition: A | Discharge status: Home / Self Care | Payer: 59 | Source: Ambulatory Visit | Attending: Adult Health | Admitting: Adult Health

## 2023-07-22 DIAGNOSIS — Z1231 Encounter for screening mammogram for malignant neoplasm of breast: Secondary | ICD-10-CM

## 2023-07-23 ENCOUNTER — Ambulatory Visit: Payer: 59 | Admitting: Rehabilitative and Restorative Service Providers"

## 2023-07-23 ENCOUNTER — Encounter: Payer: Self-pay | Admitting: Rehabilitative and Restorative Service Providers"

## 2023-07-23 DIAGNOSIS — M6281 Muscle weakness (generalized): Secondary | ICD-10-CM

## 2023-07-23 DIAGNOSIS — M25562 Pain in left knee: Secondary | ICD-10-CM

## 2023-07-23 DIAGNOSIS — G8929 Other chronic pain: Secondary | ICD-10-CM

## 2023-07-23 DIAGNOSIS — M25561 Pain in right knee: Secondary | ICD-10-CM | POA: Diagnosis not present

## 2023-07-23 DIAGNOSIS — M79671 Pain in right foot: Secondary | ICD-10-CM

## 2023-07-23 DIAGNOSIS — R262 Difficulty in walking, not elsewhere classified: Secondary | ICD-10-CM

## 2023-07-23 NOTE — Therapy (Signed)
 OUTPATIENT PHYSICAL THERAPY EVALUATION   Patient Name: Natalie Cordova MRN: [id number] DOB:[birth year], 48 y.o., female Today's Date: 07/23/2023  END OF SESSION:  PT End of Session - 07/23/23 1602     Visit Number 1    Number of Visits 20    Date for PT Re-Evaluation 10/01/23    Authorization Type [employer] $45 copay, no visit limit.    Progress Note Due on Visit 10    PT Start Time 1605    PT Stop Time 1640    PT Time Calculation (min) 35 min    Activity Tolerance Patient limited by pain    Behavior During Therapy [facility] for tasks assessed/performed             Past Medical History:  Diagnosis Date   Anxiety    Asthma    Contraceptive management 03/07/2014   Depression    Essential hypertension    related to anxiety and stress per patient   Fibroids 07/22/2017   Heart palpitations    Migraine headache    Osteoarthritis of both knees    Plantar fasciitis of right foot    Past Surgical History:  Procedure Laterality Date   COLONOSCOPY WITH PROPOFOL  N/A 04/03/2022   Procedure: COLONOSCOPY WITH PROPOFOL ;  Surgeon: Shaaron Lamar HERO, MD;  [facility];  Service: Endoscopy;  Laterality: N/A;  7:30am   HYSTEROSCOPY N/A 11/07/2017   Procedure: HYSTEROSCOPY WITH HYDROTHERMAL ABLATION;  Surgeon: Edsel Norleen GAILS, MD;  Location: [facility];  Service: Gynecology;  Laterality: N/A;   LAPAROSCOPIC BILATERAL SALPINGECTOMY Bilateral 11/07/2017   Procedure: LAPAROSCOPIC BILATERAL SALPINGECTOMY;  Surgeon: Edsel Norleen GAILS, MD;  Location: [facility];  Service: Gynecology;  Laterality: Bilateral;   LEEP  1995   SUPRACERVICAL ABDOMINAL HYSTERECTOMY N/A 01/09/2022   Procedure: HYSTERECTOMY SUPRACERVICAL ABDOMINAL;  Surgeon: Jayne Vonn DEL, MD;  Location: [facility];  Service: Gynecology;  Laterality: N/A;   WISDOM TOOTH EXTRACTION     Patient Active Problem List   Diagnosis Date Noted   Postcoital and contact bleeding 09/02/2022   S/P supracervical hysterectomy 01/09/22:  Post-operative state  09/02/2022   Routine Papanicolaou smear 09/02/2022   S/P abdominal supracervical subtotal hysterectomy 09/02/2022   BMI 40.0-44.9, adult (HCC) 09/02/2022   Colon cancer screening 02/14/2022   Constipation 02/14/2022   Class 3 severe obesity without serious comorbidity with body mass index (BMI) of 40.0 to 44.9 in adult (HCC) 02/14/2022   Dysmenorrhea    Fibroids, submucosal    Vaginal irritation 06/15/2020   Vaginal discharge 06/15/2020   Vaginal odor 06/15/2020   Scoliosis deformity of spine 05/25/2019   Accident while engaged in work-related activity 05/03/2019   Degeneration of lumbar intervertebral disc 05/03/2019   Low back pain 05/03/2019   Lumbar radiculopathy 05/03/2019   Menorrhagia with regular cycle 11/07/2017   Fibroid uterus 10/14/2017   Fibroids 07/22/2017   Heart palpitations 10/25/2016   Essential hypertension 10/25/2016   Encounter for gynecological examination with Papanicolaou smear of cervix 07/24/2016   Weight gain 07/24/2016   Body mass index 37.0-37.9, adult 07/24/2016   Weight loss counseling, encounter for 07/24/2016   Encounter for sterilization 07/24/2016   Pregnancy examination or test, negative result 05/29/2016   Irregular intermenstrual bleeding 05/29/2016   Contraceptive management 03/07/2014    PCP: Kathrene Mardy HERO PA-C  REFERRING PROVIDER: Burnetta Brunet, DO  REFERRING DIAG: M17.11 (ICD-10-CM) - Unilateral primary osteoarthritis, right knee M22.2X1,M22.2X2 (ICD-10-CM) - Patellofemoral pain syndrome of both knees M21.41,M21.42 (ICD-10-CM) - Pes planus of both  feet  THERAPY DIAG:  Chronic pain of left knee  Chronic pain of right knee  Pain in right foot  Muscle weakness (generalized)  Difficulty in walking, not elsewhere classified  Rationale for Evaluation and Treatment: Rehabilitation  ONSET DATE: Acute on Chronic 05/2023.   SUBJECTIVE:   SUBJECTIVE STATEMENT: Pt indicated complaints about 4-5 years ago with crunchy sound  in knees with pain during the daily (stairs, squatting, etc).  Pt indicated having swelling in Rt knee that led to ED visit and drawing off fluid helped a lot.  Pt indicated stairs continue to cause trouble, bending/squatting.  Pt indicated doing some walking with treadmill for exercise.   Aspiration and injection previously provided. Recent ED visit on 05/20/2023 with Rt knee pain.   Wearing boot on Rt foot since before christmas due to complaints.    PERTINENT HISTORY: Anxiety, Asthma, Depression, OA both knees, history of plantar fascia complaints Rt foot per chart review.   MRI ordered/scheduled for Rt ankle.   PAIN:  NPRS scale: current upon arrival 4/10, at worst 8/10 Pain location: bilateral knee joint pain anteriorly Rt more than Lt.  Pain description: achy, dull, sharp  Aggravating factors: walking/standing, stairs, squatting, can hurt at night.  Relieving factors: medicine, heating, pad  PRECAUTIONS: None  WEIGHT BEARING RESTRICTIONS: No  FALLS:  Has patient fallen in last 6 months? 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 Occasional ladder use.  Mainly sitting based.    PLOF: Independent, walking, tried some tapes but limited by pain.   PATIENT GOALS: Reduce pain, get more mobile.    OBJECTIVE:   DIAGNOSTIC FINDINGS:  Xrays on 05/20/2023: IMPRESSION: 1. Mild osteoarthritis greatest in the medial compartment. 2. Small joint effusion. 3. No acute fracture.  PATIENT SURVEYS:  07/23/2023 FOTO intake: 57   predicted:  60  COGNITION: 07/23/2023 Overall cognitive status: WFL    SENSATION: 07/23/2023 WFL  EDEMA:  07/23/2023 Not observed  MUSCLE LENGTH: 07/23/2023 No specific testing  POSTURE:  07/23/2023 No Significant postural limitations  PALPATION: 07/23/2023 No specific testing performed today  LOWER EXTREMITY ROM:   ROM Right 07/23/2023 Left 07/23/2023  Hip  flexion    Hip extension    Hip abduction    Hip adduction    Hip internal rotation    Hip external rotation    Knee flexion WFL to soft tissue approx with discomfort complaints. WFL to soft tissue approx with no complaints.   Knee extension 0 deg in supine heel prop 3 degrees hyperextension in supine heel prop  Ankle dorsiflexion    Ankle plantarflexion    Ankle inversion    Ankle eversion     (Blank rows = not tested)  LOWER EXTREMITY MMT:  MMT Right 07/23/2023 Left 07/23/2023  Hip flexion 3/5 5/5  Hip extension    Hip abduction    Hip adduction    Hip internal rotation    Hip external rotation    Knee flexion 4/5 c pain anteriorly 4/5  Knee extension 5/5 31, 30.5 5/5 38.1, 37 lbs  Ankle dorsiflexion  5/5  Ankle plantarflexion    Ankle inversion    Ankle eversion     (Blank rows = not tested)  LOWER EXTREMITY SPECIAL TESTS:  07/23/2023 No specific testing.   FUNCTIONAL TESTS:  07/23/2023 18 inch chair transfer: without UE assist on 1st try.  Mild pain noted in Rt knee during performance.  SLS  testing was not performed today due to boot on Rt leg  GAIT: 07/23/2023 Independent ambulation with boot on Rt foot with associated deficits due to boot mechanics.                                                                                                                                                                         TODAY'S TREATMENT                                                                          DATE: 07/23/2023 Therex:    HEP instruction/performance c cues for techniques, handout provided.  Trial set performed of each for comprehension and symptom assessment.  See below for exercise list  PATIENT EDUCATION:  07/23/2023 Education details: HEP, POC Person educated: Patient Education method: Explanation, Demonstration, Verbal cues, and Handouts Education comprehension: verbalized understanding, returned demonstration, and verbal cues required  HOME EXERCISE  PROGRAM: Access Code: [email or web address] URL: [email or web address] Date: 07/23/2023 Prepared by: Ozell Silvan  Exercises - Seated Long Arc Quad  - 3-5 x daily - 7 x weekly - 1-2 sets - 10 reps - 2 hold - Seated Quad Set  - 3-5 x daily - 7 x weekly - 1 sets - 10 reps - 5 hold - Seated Straight Leg Heel Taps  - 1-2 x daily - 7 x weekly - 3 sets - 10 reps - Seated Isometric Knee Extension  - 2-3 x daily - 7 x weekly - 1 sets - 10 reps - 5-10 hold  ASSESSMENT:  CLINICAL IMPRESSION: Patient is a 48 y.o. who comes to clinic with complaints of bilateral knee pain as well as Rt foot pain with mobility, strength and movement coordination deficits that impair their ability to perform usual daily and recreational functional activities without increase difficulty/symptoms at this time.  Patient to benefit from skilled PT services to address impairments and limitations to improve to previous level of function without restriction secondary to condition.   OBJECTIVE IMPAIRMENTS: Abnormal gait, decreased activity tolerance, decreased balance, decreased coordination, decreased endurance, decreased mobility, difficulty walking, decreased ROM, decreased strength, hypomobility, increased edema, increased fascial restrictions, impaired perceived functional ability, increased muscle spasms, impaired flexibility, improper body mechanics, and pain.   ACTIVITY LIMITATIONS: carrying, lifting, sitting, standing, squatting, stairs, transfers, reach over head, and locomotion level  PARTICIPATION LIMITATIONS: meal prep, interpersonal relationship, driving, shopping, community activity, and occupation  PERSONAL FACTORS:  Anxiety, Asthma, Depression, OA both knees, history of  plantar fascia complaints Rt foot per chart review.   are also affecting patient's functional outcome.   REHAB POTENTIAL: Good  CLINICAL DECISION MAKING: Stable/uncomplicated  EVALUATION COMPLEXITY: Low   GOALS: Goals reviewed with  patient? Yes  SHORT TERM GOALS: (target date for Short term goals are 3 weeks 08/13/2023)   1.  Patient will demonstrate independent use of home exercise program to maintain progress from in clinic treatments.  Goal status: New  LONG TERM GOALS: (target dates for all long term goals are 10 weeks  10/01/2023 )   1. Patient will demonstrate/report pain at worst less than or equal to 2/10 to facilitate minimal limitation in daily activity secondary to pain symptoms.  Goal status: New   2. Patient will demonstrate independent use of home exercise program to facilitate ability to maintain/progress functional gains from skilled physical therapy services.  Goal status: New   3. Patient will demonstrate FOTO outcome > or = 60 % to indicate reduced disability due to condition.  Goal status: New   4.  Patient will demonstrate bilateral LE MMT 5/5 throughout to faciltiate usual transfers, stairs, squatting at [facility] for daily life.   Goal status: New   5.  Patient will demonstrate will demonstrate ability to ascend/descend stairs reciprocally s deviation for household navigation.  Goal status: New   6.  Patient will demonstrate independent ambulation s boot s deviation due to symptoms community distances.  Goal status: New   7.  Patient will demonstrate ability to perform workout activity as desired.  Goal Status: New   PLAN:  PT FREQUENCY: 1-2x/week  PT DURATION: 10 weeks  PLANNED INTERVENTIONS: Can include 02853- PT Re-evaluation, 97110-Therapeutic exercises, 97530- Therapeutic activity, [id number]- Neuromuscular re-education, 97535- Self Care, 97140- Manual therapy, [phone]- Gait training, [phone]- Orthotic Fit/training, [phone]- Canalith repositioning, [id number]- Aquatic Therapy, 97014- Electrical stimulation (unattended), [id number]- Electrical stimulation (manual), [id number]- Vasopneumatic device, [id number]- Ultrasound, [id number]- Traction (mechanical), [id number]- Ionotophoresis 4mg /ml Dexamethasone , Patient/Family  education, Balance training, Stair training, Taping, Dry Needling, Joint mobilization, Joint manipulation, Spinal manipulation, Spinal mobilization, Scar mobilization, Vestibular training, Visual/preceptual remediation/compensation, DME instructions, Cryotherapy, and Moist heat.  All performed as medically necessary.  All included unless contraindicated  PLAN FOR NEXT SESSION: Review HEP knowledge/results.   Early quad strengthening.  Add Rt foot things in as able per removal from boot.    Ozell Silvan, PT, DPT, OCS, ATC 07/23/23  4:42 PM

## 2023-07-24 ENCOUNTER — Ambulatory Visit
Admission: RE | Admit: 2023-07-24 | Discharge: 2023-07-24 | Disposition: A | Discharge status: Home / Self Care | Payer: 59 | Source: Ambulatory Visit | Attending: Podiatry | Admitting: Podiatry

## 2023-07-24 ENCOUNTER — Other Ambulatory Visit: Payer: Self-pay | Admitting: Adult Health

## 2023-07-24 DIAGNOSIS — S93691A Other sprain of right foot, initial encounter: Secondary | ICD-10-CM

## 2023-07-24 DIAGNOSIS — R928 Other abnormal and inconclusive findings on diagnostic imaging of breast: Secondary | ICD-10-CM

## 2023-07-31 ENCOUNTER — Telehealth: Payer: Self-pay | Admitting: Podiatry

## 2023-07-31 NOTE — Telephone Encounter (Signed)
Lvm for pt to call to schedule an appt with Dr Al Corpus for [facility] results.

## 2023-08-04 ENCOUNTER — Encounter: Payer: 59 | Admitting: Rehabilitative and Restorative Service Providers"

## 2023-08-06 ENCOUNTER — Ambulatory Visit
Admission: RE | Admit: 2023-08-06 | Discharge: 2023-08-06 | Disposition: A | Discharge status: Home / Self Care | Payer: 59 | Source: Ambulatory Visit | Attending: Adult Health | Admitting: Adult Health

## 2023-08-06 DIAGNOSIS — R928 Other abnormal and inconclusive findings on diagnostic imaging of breast: Secondary | ICD-10-CM

## 2023-08-07 ENCOUNTER — Encounter: Payer: 59 | Admitting: Physical Therapy

## 2023-08-11 ENCOUNTER — Encounter: Payer: 59 | Admitting: Physician Assistant

## 2023-08-12 ENCOUNTER — Encounter: Payer: 59 | Admitting: Rehabilitative and Restorative Service Providers"

## 2023-08-14 ENCOUNTER — Encounter: Payer: 59 | Admitting: Rehabilitative and Restorative Service Providers"

## 2023-08-19 ENCOUNTER — Encounter: Payer: 59 | Admitting: Rehabilitative and Restorative Service Providers"

## 2023-08-21 ENCOUNTER — Encounter: Payer: 59 | Admitting: Rehabilitative and Restorative Service Providers"

## 2023-08-26 ENCOUNTER — Encounter: Payer: 59 | Admitting: Rehabilitative and Restorative Service Providers"

## 2023-08-28 ENCOUNTER — Encounter: Payer: 59 | Admitting: Rehabilitative and Restorative Service Providers"

## 2023-08-30 ENCOUNTER — Other Ambulatory Visit: Payer: Self-pay | Admitting: Podiatry

## 2023-09-01 ENCOUNTER — Ambulatory Visit: Payer: 59 | Admitting: Sports Medicine

## 2023-09-02 ENCOUNTER — Ambulatory Visit: Payer: 59 | Admitting: Podiatry

## 2023-09-02 ENCOUNTER — Encounter: Payer: Self-pay | Admitting: Podiatry

## 2023-09-02 DIAGNOSIS — M722 Plantar fascial fibromatosis: Secondary | ICD-10-CM | POA: Diagnosis not present

## 2023-09-03 NOTE — Progress Notes (Signed)
 She presents today for follow-up of her MRI.  She states that it seems to be getting worse and ready to get it fixed.  Objective: Vital signs are stable she is alert and oriented x 3.  Has severe pain on palpation medial calcaneal tubercle of the right foot with strong palpable pulses.  No calf pain.  MRI does relate a small tear of the medial band of the plantar fascia  Assessment: Plantar fascial tear.  Plan: Discussed etiology pathology conservative surgical therapies at this point consented her for an endoscopic plantar fasciotomy medial band of the right foot.  PRP injection will be provided as well.

## 2023-09-04 ENCOUNTER — Ambulatory Visit: Payer: 59 | Admitting: Sports Medicine

## 2023-09-08 ENCOUNTER — Telehealth: Payer: Self-pay | Admitting: Urology

## 2023-09-08 NOTE — Telephone Encounter (Signed)
 LM for pt to call back when  she is ready to schedule her sx with Dr. Al Corpus.

## 2023-09-10 ENCOUNTER — Telehealth: Payer: Self-pay | Admitting: Podiatry

## 2023-09-10 NOTE — Telephone Encounter (Signed)
 DOS-09/26/23  EPF [id number] INJECTION [id number]   [employer] EFFECTIVE DATE- 05/16/19   PER THE [employer]ET[employer] AUTOMATED SYSTEM, PRIOR AUTH IS NOT REQUIRED FOR CPT CODES 16109 AND 20550.   CALL REF #: [id number]

## 2023-09-16 ENCOUNTER — Telehealth: Payer: Self-pay

## 2023-09-16 NOTE — Telephone Encounter (Signed)
 Patient called and left a message, asking for a handicap placard. She would like to get it before her upcoming surgery. Should it be for 3 months or 6 months?

## 2023-09-24 ENCOUNTER — Telehealth: Payer: Self-pay | Admitting: Podiatry

## 2023-09-24 NOTE — Telephone Encounter (Signed)
 Revised RTW date till 11/12/23. Refaxed to [phone] STD forms/note for patient. She came in the office and I gave her updated forms for her records. Leave time 09/25/23-11/12/23

## 2023-09-24 NOTE — Telephone Encounter (Signed)
 Left mess at [phone] to adv pt of FMLA/STD/notes were faxed to Nita at [phone]. I advised her would leave copy at front desk for her to pick up or she can call me and vrf her addr and I would mail her copy of forms for her records.

## 2023-09-25 ENCOUNTER — Other Ambulatory Visit: Payer: Self-pay | Admitting: Podiatry

## 2023-09-25 MED ORDER — ONDANSETRON HCL 4 MG PO TABS: 4.0000 mg | ORAL_TABLET | Freq: Three times a day (TID) | ORAL | 0 refills | Status: DC | PRN

## 2023-09-25 MED ORDER — CEPHALEXIN 500 MG PO CAPS: 500.0000 mg | ORAL_CAPSULE | Freq: Three times a day (TID) | ORAL | 0 refills | Status: DC

## 2023-09-25 MED ORDER — OXYCODONE-ACETAMINOPHEN 10-325 MG PO TABS: 1.0000 | ORAL_TABLET | Freq: Three times a day (TID) | ORAL | 0 refills | Status: AC | PRN

## 2023-09-26 DIAGNOSIS — M722 Plantar fascial fibromatosis: Secondary | ICD-10-CM | POA: Diagnosis not present

## 2023-10-02 ENCOUNTER — Encounter: Payer: Self-pay | Admitting: Podiatry

## 2023-10-02 ENCOUNTER — Ambulatory Visit (INDEPENDENT_AMBULATORY_CARE_PROVIDER_SITE_OTHER): Payer: 59 | Admitting: Podiatry

## 2023-10-02 DIAGNOSIS — Z9889 Other specified postprocedural states: Secondary | ICD-10-CM

## 2023-10-02 DIAGNOSIS — M722 Plantar fascial fibromatosis: Secondary | ICD-10-CM

## 2023-10-02 NOTE — Progress Notes (Signed)
 She presents today for her first postop visit date of surgery 09/26/2023 endoscopic fasciotomy PRP injection.  She states that the bandages bother her more than the foot itself but she did have to take some pain medication when the block wore off.  Has stayed in the boot 24/7 since surgery.  Denies fever chills nausea vomit muscle aches pains calf pain back pain chest pain shortness of breath.  Objective: Vital signs are stable alert oriented x 3.  Pulses are palpable.  Dry sterile dressing was removed demonstrating mild abrasion to the top of the foot otherwise incision sites are healing nicely minimal tenderness on the bottom of the foot.  Assessment: Well-healing surgical foot.  Plan: Redressed with Band-Aids today and an Ace bandage recommended that she start washing this and then we will remove the sutures next week.  Continue the use of the cam boot for another week 24 hours a day and we will switch to tennis shoes next week.

## 2023-10-08 ENCOUNTER — Telehealth: Payer: Self-pay | Admitting: Podiatry

## 2023-10-08 NOTE — Telephone Encounter (Signed)
 Faxed Nita at [phone] Sick benefit form/provider forms/notes. Leave dates 09/25/23-11/12/23

## 2023-10-13 ENCOUNTER — Encounter: Payer: 59 | Admitting: Physician Assistant

## 2023-10-16 ENCOUNTER — Encounter: Payer: Self-pay | Admitting: Podiatry

## 2023-10-16 ENCOUNTER — Ambulatory Visit (INDEPENDENT_AMBULATORY_CARE_PROVIDER_SITE_OTHER): Payer: 59 | Admitting: Podiatry

## 2023-10-16 DIAGNOSIS — M722 Plantar fascial fibromatosis: Secondary | ICD-10-CM | POA: Diagnosis not present

## 2023-10-16 DIAGNOSIS — Z9889 Other specified postprocedural states: Secondary | ICD-10-CM

## 2023-10-16 NOTE — Progress Notes (Signed)
 She presents today date of surgery 09/26/2023 endoscopic plantar fasciotomy with PRP injection right foot.  States that he still feels a little tender.  Objective: Vital stable alert oriented x 3.  Pulses are palpable.  Incision sites are retaining sutures.  Was removed demonstrates well coapted sites.  She still has some swelling to the plantar aspect of the foot between the fourth bites.  It is kind of tight does not appear to be bruised.  Assessment: Healing endoscopic plantar fasciotomy.  Plan: Remove the sutures today pressure compression anklet she will stay in her cam boot at nighttime but tennis shoe during the day she may have to alternate.

## 2023-11-06 ENCOUNTER — Telehealth: Payer: Self-pay | Admitting: Podiatry

## 2023-11-06 ENCOUNTER — Ambulatory Visit (INDEPENDENT_AMBULATORY_CARE_PROVIDER_SITE_OTHER): Payer: 59 | Admitting: Podiatry

## 2023-11-06 ENCOUNTER — Encounter: Payer: Self-pay | Admitting: Podiatry

## 2023-11-06 DIAGNOSIS — M722 Plantar fascial fibromatosis: Secondary | ICD-10-CM

## 2023-11-06 DIAGNOSIS — Z9889 Other specified postprocedural states: Secondary | ICD-10-CM

## 2023-11-06 NOTE — Telephone Encounter (Signed)
 Per Dr. Lara Plants RTW extended to 12/15/23. Faxed to Nita at [phone] a letter with revised date

## 2023-11-06 NOTE — Progress Notes (Signed)
 Presents today as a postop visit date of surgery is 09/26/2023 she had a right endoscopic plantar fasciotomy.  States that she went well today but developed really tight and swollen.  She states that she feels that she cannot possibly go back to work as of yet with her being the sore ankle.  Objective: Vitals are stable alert oriented x 3.  Pulses are palpable.  Moderate edema to the right surgical foot.  No signs of infection.  She does have tenderness on palpation of the surgical sites and from 1 portal to another portal is tender plantarly.  Assessment: Postop swelling and tenderness status post EPF right foot.  Plan: We are going to encourage her to go to formal physical therapy I will put her out of work for 1 more month I think is going to take at least a month before she is able to get back to walking normally.  I am also going to consider orthotics in 1 month as she follows up with us  as well.

## 2023-11-06 NOTE — Telephone Encounter (Signed)
 Tried faxing to Nita at [phone] about 3 times and it is not going thru. I called Nita at [phone] and left her a message to call me back with another fax# I can use to fax letter of revised RTW for pt.

## 2023-11-07 ENCOUNTER — Other Ambulatory Visit: Payer: Self-pay

## 2023-11-07 ENCOUNTER — Telehealth: Payer: Self-pay | Admitting: Podiatry

## 2023-11-07 ENCOUNTER — Ambulatory Visit: Attending: Podiatry

## 2023-11-07 DIAGNOSIS — M722 Plantar fascial fibromatosis: Secondary | ICD-10-CM | POA: Insufficient documentation

## 2023-11-07 DIAGNOSIS — M6281 Muscle weakness (generalized): Secondary | ICD-10-CM | POA: Diagnosis present

## 2023-11-07 DIAGNOSIS — Z9889 Other specified postprocedural states: Secondary | ICD-10-CM | POA: Insufficient documentation

## 2023-11-07 DIAGNOSIS — R262 Difficulty in walking, not elsewhere classified: Secondary | ICD-10-CM | POA: Insufficient documentation

## 2023-11-07 DIAGNOSIS — M79671 Pain in right foot: Secondary | ICD-10-CM | POA: Insufficient documentation

## 2023-11-07 NOTE — Telephone Encounter (Signed)
 Fax still didn't go thru to [phone]. I emailed Nita at [email or web address] letter to advise RTW date is 12/15/23.

## 2023-11-08 NOTE — Therapy (Signed)
 OUTPATIENT PHYSICAL THERAPY LOWER EXTREMITY EVALUATION   Patient Name: Natalie Cordova MRN: [id number] DOB:1975/08/07, 48 y.o., female Today's Date: 11/08/2023  END OF SESSION:  PT End of Session - 11/07/23 1240     Visit Number 1    Number of Visits 13    Date for PT Re-Evaluation 12/27/23    Authorization Type AET[employer]    PT Start Time 1230    PT Stop Time 1315    PT Time Calculation (min) 45 min    Activity Tolerance Patient limited by pain    Behavior During Therapy [facility] for tasks assessed/performed             Past Medical History:  Diagnosis Date   Anxiety    Asthma    Contraceptive management 03/07/2014   Depression    Essential hypertension    related to anxiety and stress per patient   Fibroids 07/22/2017   Heart palpitations    Migraine headache    Osteoarthritis of both knees    Plantar fasciitis of right foot    Past Surgical History:  Procedure Laterality Date   COLONOSCOPY WITH PROPOFOL  N/A 04/03/2022   Procedure: COLONOSCOPY WITH PROPOFOL ;  Surgeon: Suzette Espy, MD;  Location: [facility];  Service: Endoscopy;  Laterality: N/A;  7:30am   HYSTEROSCOPY N/A 11/07/2017   Procedure: HYSTEROSCOPY WITH HYDROTHERMAL ABLATION;  Surgeon: Albino Hum, MD;  Location: [facility];  Service: Gynecology;  Laterality: N/A;   LAPAROSCOPIC BILATERAL SALPINGECTOMY Bilateral 11/07/2017   Procedure: LAPAROSCOPIC BILATERAL SALPINGECTOMY;  Surgeon: Albino Hum, MD;  Location: [facility];  Service: Gynecology;  Laterality: Bilateral;   LEEP  1995   SUPRACERVICAL ABDOMINAL HYSTERECTOMY N/A 01/09/2022   Procedure: HYSTERECTOMY SUPRACERVICAL ABDOMINAL;  Surgeon: Wendelyn Halter, MD;  Location: [facility];  Service: Gynecology;  Laterality: N/A;   WISDOM TOOTH EXTRACTION     Patient Active Problem List   Diagnosis Date Noted   Postcoital and contact bleeding 09/02/2022   S/P supracervical hysterectomy 01/09/22:  Post-operative state 09/02/2022   Routine Papanicolaou smear  09/02/2022   S/P abdominal supracervical subtotal hysterectomy 09/02/2022   BMI 40.0-44.9, adult (HCC) 09/02/2022   Colon cancer screening 02/14/2022   Constipation 02/14/2022   Class 3 severe obesity without serious comorbidity with body mass index (BMI) of 40.0 to 44.9 in adult [facility]) 02/14/2022   Dysmenorrhea    Fibroids, submucosal    Vaginal irritation 06/15/2020   Vaginal discharge 06/15/2020   Vaginal odor 06/15/2020   Scoliosis deformity of spine 05/25/2019   Accident while engaged in work-related activity 05/03/2019   Degeneration of lumbar intervertebral disc 05/03/2019   Low back pain 05/03/2019   Lumbar radiculopathy 05/03/2019   Menorrhagia with regular cycle 11/07/2017   Fibroid uterus 10/14/2017   Fibroids 07/22/2017   Heart palpitations 10/25/2016   Essential hypertension 10/25/2016   Encounter for gynecological examination with Papanicolaou smear of cervix 07/24/2016   Weight gain 07/24/2016   Body mass index 37.0-37.9, adult 07/24/2016   Weight loss counseling, encounter for 07/24/2016   Encounter for sterilization 07/24/2016   Pregnancy examination or test, negative result 05/29/2016   Irregular intermenstrual bleeding 05/29/2016   Contraceptive management 03/07/2014    PCP: Allwardt, Deleta Felix, PA-C  REFERRING PROVIDER: Clemetine Cypher, DPM  REFERRING DIAG:  M72.2 (ICD-10-CM) - Plantar fasciitis of right foot  Z98.890 (ICD-10-CM) - Status post right foot surgery    THERAPY DIAG:  Pain in right foot - Plan: PT plan of care cert/re-cert  Difficulty in walking, not elsewhere classified - Plan: PT plan of care cert/re-cert  Muscle weakness (generalized) - Plan: PT plan of care cert/re-cert  Rationale for Evaluation and Treatment: Rehabilitation  ONSET DATE: Endoscopic plantar fasciotomy right foot - DOS 09/26/23  SUBJECTIVE:   SUBJECTIVE STATEMENT: Chronic Hx of R heel/foot pain since June 2025. R foot pain is better since surgery, but had pain and  swelling after a walk of 1 block.  PERTINENT HISTORY: OA both knees, anxiety  PAIN:  Are you having pain? Yes: NPRS scale: 4/10. Pain range the week prior to PT: 1-7/10 Pain location: L medial heel Pain description: ache, throb, sharpe, constant Aggravating factors: First steps after sitting or in the AM, prolonged time on her feet Relieving factors: Compression sock, ice pack  PRECAUTIONS: None  RED FLAGS: None   WEIGHT BEARING RESTRICTIONS: No  FALLS:  Has patient fallen in last 6 months? 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  DIAGNOSTIC FINDINGS:  MRI R ankle /foot 07/24/23 IMPRESSION: 1. Moderate thickening of the medial band of the plantar fascia towards the calcaneal insertion consistent with plantar fasciitis with a small partial-thickness tear.  PATIENT SURVEYS:  LEFS 35/80=43%  COGNITION: Overall cognitive status: Within functional limits for tasks assessed     SENSATION: WFL  EDEMA:  Swelling present L medial heel  MUSCLE LENGTH: Hamstrings: Right WNLs deg; Left WNLs deg Andy Bannister test: Right WNLs deg; Left WNLs deg  POSTURE:  Out toeing L>R  PALPATION: Medial border R heel  LOWER EXTREMITY ROM:  Active ROM Right eval Left eval  Hip flexion    Hip extension    Hip abduction    Hip adduction    Hip internal rotation    Hip external rotation    Knee flexion    Knee extension    Ankle dorsiflexion 11 15  Ankle plantarflexion 40 40  Ankle inversion    Ankle eversion     (Blank rows = not tested)  LOWER EXTREMITY MMT:  MMT Right eval Left eval  Hip flexion    Hip extension    Hip abduction     Hip adduction    Hip internal rotation    Hip external rotation    Knee flexion    Knee extension    Ankle dorsiflexion 4+   Ankle plantarflexion 3/5   Ankle inversion 4+   Ankle eversion 4+    (Blank rows = not tested)  LOWER EXTREMITY SPECIAL TESTS:  Ankle special tests: NT  FUNCTIONAL TESTS:  2 minute walk test: TBA Single leg standing: TBA  GAIT: Distance walked: 200' Assistive device utilized: None Level of assistance: Complete Independence Comments: Gait WNLS for limted distance  TREATMENT DATE:  [facility] Adult PT Treatment:                                                DATE: 11/07/23 Therapeutic Exercise: Developed, instructed in, and pt completed therex as noted in HEP  Self Care: RICE as needed for pain and swelling management   PATIENT EDUCATION:  Education details: Eval findings, POC, HEP, self care  Person educated: Patient Education method: Explanation, Demonstration, Tactile cues, Verbal cues, and Handouts Education comprehension: verbalized understanding, returned demonstration, verbal cues required, and tactile cues required  HOME EXERCISE PROGRAM: Access Code: [email or web address] URL: [email or web address] Date: 11/08/2023 Prepared by: Liborio Reeds  Exercises - Long Sitting Calf Stretch with Strap (Mirrored)  - 1-2 x daily - 7 x weekly - 1 sets - 3 reps - 20 hold - Seated Toe Towel Scrunches  - 1 x daily - 7 x weekly - 1 sets - 20 reps - Toe Yoga - Alternating Great Toe and Lesser Toe Extension  - 1 x daily - 7 x weekly - 1 sets - 20 reps - Seated Heel Raise  - 1 x daily - 7 x weekly - 2-3 sets - 10 reps - 3 hold - Long Sitting Ankle Plantar Flexion with Resistance (Mirrored)  - 1 x daily - 7 x weekly - 2-3 sets - 10 reps  ASSESSMENT:  CLINICAL IMPRESSION: Patient is a 48 y.o. female who was seen today for physical  therapy evaluation and treatment for  M72.2 (ICD-10-CM) - Plantar fasciitis of right foot  Z98.890 (ICD-10-CM) - Status post right foot surgery  Evaluate and treat for s/p endoscopic plantar fasciotomy right foot - DOS 09/26/23 - decrease swelling and pain. Pt presents with min decrease in R ankle DF AROM, tenderness along the medial border of the R heel, and decreased ability to PF the R foot/ankle in weight bearing and to tolerate walking longer distances due to heel pain. A HEP for R foot/ankle ROM and strengthening was started. Pt will benefit from skilled PT 2w6 to address impairments to optimize Rfoot/ankle function with less pain.   OBJECTIVE IMPAIRMENTS: decreased activity tolerance, decreased balance, difficulty walking, decreased ROM, decreased strength, increased edema, obesity, and pain.   ACTIVITY LIMITATIONS: squatting and locomotion level  PARTICIPATION LIMITATIONS: meal prep, cleaning, shopping, community activity, and occupation  PERSONAL FACTORS: Fitness, Past/current experiences, Time since onset of injury/illness/exacerbation, and 1-2 comorbidities: High BMI, OA both knees,anxiety  are also affecting patient's functional outcome.   REHAB POTENTIAL: Good  CLINICAL DECISION MAKING: Evolving/moderate complexity  EVALUATION COMPLEXITY: Moderate   GOALS:  SHORT TERM GOALS= LTGs  LONG TERM GOALS: Target date: 12/27/23  Pt will be Ind in a final HEP to maintain achieved LOF  Baseline: started Goal status: INITIAL  2.  Increased R ankle DF AROM to 15d for improved walking tolerance and ability to asc/dsc steps Baseline: R 11d Goal status: INITIAL  3.  Pt will be able to complete R single leg standing PF x20 reps for improved strength for improved walking tolerance. Baseline: TBA Goal status: INITIAL  4.  Pt will demonstrate single leg standing balance within 80% of the L for appropriate community ambulation Baseline: TBA Goal status: INITIAL  5.  Improve by  MCID of 41ft as indication of improved functional mobility  Baseline: TBA Goal status: INITIAL  6.  Pt's LEFS  score will improved to 60% or greater as indication of improved function  Baseline: 44% Goal status: INITIAL   PLAN:  PT FREQUENCY: 2x/week  PT DURATION: 6 weeks  PLANNED INTERVENTIONS: 97164- PT Re-evaluation, 97110-Therapeutic exercises, 97530- Therapeutic activity, [id number]- Neuromuscular re-education, 97535- Self Care, 86578- Manual therapy, [id number]- Gait training, [phone]- Electrical stimulation (unattended), [phone]- Ionotophoresis 4mg /ml Dexamethasone , Patient/Family education, Balance training, Stair training, Taping, Dry Needling, Cryotherapy, and Moist heat  PLAN FOR NEXT SESSION: Assess SL balance, SL PF reps, and ; assess response to HEP; progress therex as indicated; use of modalities, manual therapy; and TPDN as indicated.   Devian Bartolomei MS, PT 11/08/23 4:00 PM

## 2023-11-12 ENCOUNTER — Ambulatory Visit

## 2023-11-12 DIAGNOSIS — M79671 Pain in right foot: Secondary | ICD-10-CM | POA: Diagnosis not present

## 2023-11-12 DIAGNOSIS — M6281 Muscle weakness (generalized): Secondary | ICD-10-CM

## 2023-11-12 DIAGNOSIS — R262 Difficulty in walking, not elsewhere classified: Secondary | ICD-10-CM

## 2023-11-12 NOTE — Therapy (Signed)
 OUTPATIENT PHYSICAL THERAPY LOWER EXTREMITY  Patient Name: Natalie Cordova MRN: [id number] DOB:Oct 11, 1975, 48 y.o., female Today's Date: 11/12/2023  END OF SESSION:  PT End of Session - 11/12/23 4098     Visit Number 2    Number of Visits 13    Date for PT Re-Evaluation 12/27/23    Authorization Type AETNA    Progress Note Due on Visit 10    PT Start Time 0918    PT Stop Time 0956    PT Time Calculation (min) 38 min    Activity Tolerance Patient limited by pain    Behavior During Therapy [facility] for tasks assessed/performed              Past Medical History:  Diagnosis Date   Anxiety    Asthma    Contraceptive management 03/07/2014   Depression    Essential hypertension    related to anxiety and stress per patient   Fibroids 07/22/2017   Heart palpitations    Migraine headache    Osteoarthritis of both knees    Plantar fasciitis of right foot    Past Surgical History:  Procedure Laterality Date   COLONOSCOPY WITH PROPOFOL  N/A 04/03/2022   Procedure: COLONOSCOPY WITH PROPOFOL ;  Surgeon: Suzette Espy, MD;  [facility];  Service: Endoscopy;  Laterality: N/A;  7:30am   HYSTEROSCOPY N/A 11/07/2017   Procedure: HYSTEROSCOPY WITH HYDROTHERMAL ABLATION;  Surgeon: Albino Hum, MD;  Location: [facility];  Service: Gynecology;  Laterality: N/A;   LAPAROSCOPIC BILATERAL SALPINGECTOMY Bilateral 11/07/2017   Procedure: LAPAROSCOPIC BILATERAL SALPINGECTOMY;  Surgeon: Albino Hum, MD;  Location: [facility];  Service: Gynecology;  Laterality: Bilateral;   LEEP  1995   SUPRACERVICAL ABDOMINAL HYSTERECTOMY N/A 01/09/2022   Procedure: HYSTERECTOMY SUPRACERVICAL ABDOMINAL;  Surgeon: Wendelyn Halter, MD;  Location: [facility];  Service: Gynecology;  Laterality: N/A;   WISDOM TOOTH EXTRACTION     Patient Active Problem List   Diagnosis Date Noted   Postcoital and contact bleeding 09/02/2022   S/P supracervical hysterectomy 01/09/22:  Post-operative state 09/02/2022   Routine  Papanicolaou smear 09/02/2022   S/P abdominal supracervical subtotal hysterectomy 09/02/2022   BMI 40.0-44.9, adult (HCC) 09/02/2022   Colon cancer screening 02/14/2022   Constipation 02/14/2022   Class 3 severe obesity without serious comorbidity with body mass index (BMI) of 40.0 to 44.9 in adult [facility]) 02/14/2022   Dysmenorrhea    Fibroids, submucosal    Vaginal irritation 06/15/2020   Vaginal discharge 06/15/2020   Vaginal odor 06/15/2020   Scoliosis deformity of spine 05/25/2019   Accident while engaged in work-related activity 05/03/2019   Degeneration of lumbar intervertebral disc 05/03/2019   Low back pain 05/03/2019   Lumbar radiculopathy 05/03/2019   Menorrhagia with regular cycle 11/07/2017   Fibroid uterus 10/14/2017   Fibroids 07/22/2017   Heart palpitations 10/25/2016   Essential hypertension 10/25/2016   Encounter for gynecological examination with Papanicolaou smear of cervix 07/24/2016   Weight gain 07/24/2016   Body mass index 37.0-37.9, adult 07/24/2016   Weight loss counseling, encounter for 07/24/2016   Encounter for sterilization 07/24/2016   Pregnancy examination or test, negative result 05/29/2016   Irregular intermenstrual bleeding 05/29/2016   Contraceptive management 03/07/2014    PCP: Allwardt, Deleta Felix, PA-C  REFERRING PROVIDER: Clemetine Cypher, DPM  REFERRING DIAG:  M72.2 (ICD-10-CM) - Plantar fasciitis of right foot  Z98.890 (ICD-10-CM) - Status post right foot surgery    THERAPY DIAG:  Pain in right  foot  Difficulty in walking, not elsewhere classified  Muscle weakness (generalized)  Rationale for Evaluation and Treatment: Rehabilitation  ONSET DATE: Endoscopic plantar fasciotomy right foot - DOS 09/26/23  SUBJECTIVE:   SUBJECTIVE STATEMENT: Patient reporting that she has been compliant with HEP since initial evaluation. She states that her pain is about 3/10 today, since she hasn't done anything else this morning yet.   PERTINENT  HISTORY: OA both knees, anxiety  PAIN:  Are you having pain? Yes: NPRS scale: 4/10. Pain range the week prior to PT: 1-7/10 Pain location: L medial heel Pain description: ache, throb, sharpe, constant Aggravating factors: First steps after sitting or in the AM, prolonged time on her feet Relieving factors: Compression sock, ice pack  PRECAUTIONS: None  RED FLAGS: None   WEIGHT BEARING RESTRICTIONS: No  FALLS:  Has patient fallen in last 6 months? 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  DIAGNOSTIC FINDINGS:  MRI R ankle /foot 07/24/23 IMPRESSION: 1. Moderate thickening of the medial band of the plantar fascia towards the calcaneal insertion consistent with plantar fasciitis with a small partial-thickness tear.  PATIENT SURVEYS:  LEFS 35/80=43%  COGNITION: Overall cognitive status: Within functional limits for tasks assessed     SENSATION: WFL  EDEMA:  Swelling present L medial heel  MUSCLE LENGTH: Hamstrings: Right WNLs deg; Left WNLs deg Andy Bannister test: Right WNLs deg; Left WNLs deg  POSTURE:  Out toeing L>R  PALPATION: Medial border R heel  LOWER EXTREMITY ROM:  Active ROM Right eval Left eval  Hip flexion    Hip extension    Hip abduction    Hip adduction    Hip internal rotation    Hip external rotation    Knee flexion    Knee extension    Ankle dorsiflexion 11 15  Ankle plantarflexion 40 40  Ankle inversion    Ankle eversion     (Blank rows = not tested)  LOWER EXTREMITY MMT:  MMT Right eval Left eval  Hip flexion    Hip extension    Hip abduction    Hip adduction    Hip internal rotation     Hip external rotation    Knee flexion    Knee extension    Ankle dorsiflexion 4+   Ankle plantarflexion 3/5   Ankle inversion 4+   Ankle eversion 4+    (Blank rows = not tested)  LOWER EXTREMITY SPECIAL TESTS:  Ankle special tests: NT  FUNCTIONAL TESTS:  2 minute walk test: TBA Single leg standing:   Left: 20 sec  Right: 17 sec  Single leg heel raises:   Unable to perform full range bilaterally   GAIT: Distance walked: 200' Assistive device utilized: None Level of assistance: Complete Independence Comments: Gait WNLS for limted distance  TREATMENT DATE:   [facility] Adult PT Treatment:                                                DATE: 11/12/2023  Therapeutic Exercise: Seated Ankle pumps 2 x 10  Inversion/Eversion towel slides 2 x 10  Towel scrunches bringing other end of the towel x 2 minutes  Resisted ankle 4-way with GTB, 2 x 10 each (DF, PF, inversion, eversion)   Therapeutic Activity: additional assessment of SL balance and SL heel raises Updated HEP and reviewed    [facility] Adult PT Treatment:                                                DATE: 11/07/23 Therapeutic Exercise: Developed, instructed in, and pt completed therex as noted in HEP  Self Care: RICE as needed for pain and swelling management   PATIENT EDUCATION:  Education details: Eval findings, POC, HEP, self care  Person educated: Patient Education method: Explanation, Demonstration, Tactile cues, Verbal cues, and Handouts Education comprehension: verbalized understanding, returned demonstration, verbal cues required, and tactile cues required  HOME EXERCISE PROGRAM: Access Code: [email or web address] URL: [email or web address] Date: 11/12/2023 Prepared by: Arlester Bence  Exercises - Long Sitting Calf Stretch with Strap (Mirrored)  - 1-2 x daily - 7 x weekly - 1 sets - 3  reps - 20 hold - Seated Toe Towel Scrunches  - 1 x daily - 7 x weekly - 1 sets - 20 reps - Toe Yoga - Alternating Great Toe and Lesser Toe Extension  - 1 x daily - 7 x weekly - 1 sets - 20 reps - Seated Heel Raise  - 1 x daily - 7 x weekly - 1 sets - 20 reps - 3 hold - Long Sitting Ankle Plantar Flexion with Resistance (Mirrored)  - 1 x daily - 7 x weekly - 1 sets - 20 reps - Seated Ankle Dorsiflexion with Resistance  - 1 x daily - 7 x weekly - 1 sets - 20 reps - Seated Ankle Eversion with Resistance  - 1 x daily - 7 x weekly - 1 sets - 20 reps - Seated Ankle Inversion with Resistance  - 1 x daily - 7 x weekly - 1 sets - 20 reps  ASSESSMENT:  CLINICAL IMPRESSION: Palyn had fair tolerance of today's treatment session, which focused on ankle and intrinsic mm strengthening. Patient has decreased SL stance time bilaterally, and is unable to perform full SL heel raise. Provided patient with updated HEP. We will continue to progress per POC as tolerated, in order to reach established rehab goals.     Patient is a 48 y.o. female who was seen today for physical therapy evaluation and treatment for  M72.2 (ICD-10-CM) - Plantar fasciitis of right foot  Z98.890 (ICD-10-CM) - Status post right foot surgery  Evaluate and treat for s/p endoscopic plantar fasciotomy right foot - DOS 09/26/23 - decrease swelling and pain. Pt presents with min decrease in R ankle DF AROM, tenderness along the medial border of the R heel, and decreased ability to PF the R foot/ankle in weight bearing and to tolerate walking longer distances due to heel pain. A HEP for R foot/ankle ROM and strengthening was  started. Pt will benefit from skilled PT 2w6 to address impairments to optimize Rfoot/ankle function with less pain.   OBJECTIVE IMPAIRMENTS: decreased activity tolerance, decreased balance, difficulty walking, decreased ROM, decreased strength, increased edema, obesity, and pain.   ACTIVITY LIMITATIONS: squatting and  locomotion level  PARTICIPATION LIMITATIONS: meal prep, cleaning, shopping, community activity, and occupation  PERSONAL FACTORS: Fitness, Past/current experiences, Time since onset of injury/illness/exacerbation, and 1-2 comorbidities: High BMI, OA both knees,anxiety  are also affecting patient's functional outcome.   REHAB POTENTIAL: Good  CLINICAL DECISION MAKING: Evolving/moderate complexity  EVALUATION COMPLEXITY: Moderate   GOALS:  SHORT TERM GOALS= LTGs  LONG TERM GOALS: Target date: 12/27/23  Pt will be Ind in a final HEP to maintain achieved LOF  Baseline: started Goal status: INITIAL  2.  Increased R ankle DF AROM to 15d for improved walking tolerance and ability to asc/dsc steps Baseline: R 11d Goal status: INITIAL  3.  Pt will be able to complete R single leg standing PF x20 reps for improved strength for improved walking tolerance. Baseline: TBA Goal status: INITIAL  4.  Pt will demonstrate single leg standing balance within 80% of the L for appropriate community ambulation Baseline: TBA Goal status: INITIAL  5.  Improve by MCID of 53ft as indication of improved functional mobility  Baseline: TBA Goal status: INITIAL  6.  Pt's LEFS score will improved to 60% or greater as indication of improved function  Baseline: 44% Goal status: INITIAL   PLAN:  PT FREQUENCY: 2x/week  PT DURATION: 6 weeks  PLANNED INTERVENTIONS: 97164- PT Re-evaluation, 97110-Therapeutic exercises, 97530- Therapeutic activity, [id number]- Neuromuscular re-education, 97535- Self Care, 93810- Manual therapy, [id number]- Gait training, [phone]- Electrical stimulation (unattended), [phone]- Ionotophoresis 4mg /ml Dexamethasone , Patient/Family education, Balance training, Stair training, Taping, Dry Needling, Cryotherapy, and Moist heat  PLAN FOR NEXT SESSION: Assess SL balance, SL PF reps, and ; assess response to HEP; progress therex as indicated; use of modalities, manual therapy; and TPDN as  indicated.   Arlester Bence, PT, DPT  11/12/2023 11:47 AM

## 2023-11-14 ENCOUNTER — Ambulatory Visit: Attending: Podiatry

## 2023-11-14 DIAGNOSIS — R262 Difficulty in walking, not elsewhere classified: Secondary | ICD-10-CM | POA: Diagnosis present

## 2023-11-14 DIAGNOSIS — M6281 Muscle weakness (generalized): Secondary | ICD-10-CM | POA: Insufficient documentation

## 2023-11-14 DIAGNOSIS — M79671 Pain in right foot: Secondary | ICD-10-CM | POA: Diagnosis present

## 2023-11-14 NOTE — Therapy (Signed)
 OUTPATIENT PHYSICAL THERAPY LOWER EXTREMITY  Patient Name: Natalie Cordova MRN: [id number] DOB:09-14-1975, 48 y.o., female Today's Date: 11/14/2023  END OF SESSION:  PT End of Session - 11/14/23 0921     Visit Number 3    Number of Visits 13    Date for PT Re-Evaluation 12/27/23    Authorization Type AETNA    Progress Note Due on Visit 10    PT Start Time 0918    PT Stop Time 0956    PT Time Calculation (min) 38 min    Activity Tolerance Patient limited by pain    Behavior During Therapy [facility] for tasks assessed/performed               Past Medical History:  Diagnosis Date   Anxiety    Asthma    Contraceptive management 03/07/2014   Depression    Essential hypertension    related to anxiety and stress per patient   Fibroids 07/22/2017   Heart palpitations    Migraine headache    Osteoarthritis of both knees    Plantar fasciitis of right foot    Past Surgical History:  Procedure Laterality Date   COLONOSCOPY WITH PROPOFOL  N/A 04/03/2022   Procedure: COLONOSCOPY WITH PROPOFOL ;  Surgeon: Suzette Espy, MD;  [facility];  Service: Endoscopy;  Laterality: N/A;  7:30am   HYSTEROSCOPY N/A 11/07/2017   Procedure: HYSTEROSCOPY WITH HYDROTHERMAL ABLATION;  Surgeon: Albino Hum, MD;  Location: [facility];  Service: Gynecology;  Laterality: N/A;   LAPAROSCOPIC BILATERAL SALPINGECTOMY Bilateral 11/07/2017   Procedure: LAPAROSCOPIC BILATERAL SALPINGECTOMY;  Surgeon: Albino Hum, MD;  Location: [facility];  Service: Gynecology;  Laterality: Bilateral;   LEEP  1995   SUPRACERVICAL ABDOMINAL HYSTERECTOMY N/A 01/09/2022   Procedure: HYSTERECTOMY SUPRACERVICAL ABDOMINAL;  Surgeon: Wendelyn Halter, MD;  Location: [facility];  Service: Gynecology;  Laterality: N/A;   WISDOM TOOTH EXTRACTION     Patient Active Problem List   Diagnosis Date Noted   Postcoital and contact bleeding 09/02/2022   S/P supracervical hysterectomy 01/09/22:  Post-operative state 09/02/2022    Routine Papanicolaou smear 09/02/2022   S/P abdominal supracervical subtotal hysterectomy 09/02/2022   BMI 40.0-44.9, adult (HCC) 09/02/2022   Colon cancer screening 02/14/2022   Constipation 02/14/2022   Class 3 severe obesity without serious comorbidity with body mass index (BMI) of 40.0 to 44.9 in adult 02/14/2022   Dysmenorrhea    Fibroids, submucosal    Vaginal irritation 06/15/2020   Vaginal discharge 06/15/2020   Vaginal odor 06/15/2020   Scoliosis deformity of spine 05/25/2019   Accident while engaged in work-related activity 05/03/2019   Degeneration of lumbar intervertebral disc 05/03/2019   Low back pain 05/03/2019   Lumbar radiculopathy 05/03/2019   Menorrhagia with regular cycle 11/07/2017   Fibroid uterus 10/14/2017   Fibroids 07/22/2017   Heart palpitations 10/25/2016   Essential hypertension 10/25/2016   Encounter for gynecological examination with Papanicolaou smear of cervix 07/24/2016   Weight gain 07/24/2016   Body mass index 37.0-37.9, adult 07/24/2016   Weight loss counseling, encounter for 07/24/2016   Encounter for sterilization 07/24/2016   Pregnancy examination or test, negative result 05/29/2016   Irregular intermenstrual bleeding 05/29/2016   Contraceptive management 03/07/2014    PCP: Allwardt, Deleta Felix, PA-C  REFERRING PROVIDER: Clemetine Cypher, DPM  REFERRING DIAG:  M72.2 (ICD-10-CM) - Plantar fasciitis of right foot  Z98.890 (ICD-10-CM) - Status post right foot surgery    THERAPY DIAG:  Pain in right  foot  Difficulty in walking, not elsewhere classified  Muscle weakness (generalized)  Rationale for Evaluation and Treatment: Rehabilitation  ONSET DATE: Endoscopic plantar fasciotomy right foot - DOS 09/26/23  SUBJECTIVE:   SUBJECTIVE STATEMENT: Patient reporting that she doesn't have much pain lately. However, she does have persistent/annoying numbness on the top of her foot.   PERTINENT HISTORY: OA both knees, anxiety  PAIN:  Are  you having pain? Yes: NPRS scale: 4/10. Pain range the week prior to PT: 1-7/10 Pain location: L medial heel Pain description: ache, throb, sharpe, constant Aggravating factors: First steps after sitting or in the AM, prolonged time on her feet Relieving factors: Compression sock, ice pack  PRECAUTIONS: None  RED FLAGS: None   WEIGHT BEARING RESTRICTIONS: No  FALLS:  Has patient fallen in last 6 months? 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  DIAGNOSTIC FINDINGS:  MRI R ankle /foot 07/24/23 IMPRESSION: 1. Moderate thickening of the medial band of the plantar fascia towards the calcaneal insertion consistent with plantar fasciitis with a small partial-thickness tear.  PATIENT SURVEYS:  LEFS 35/80=43%  COGNITION: Overall cognitive status: Within functional limits for tasks assessed     SENSATION: WFL  EDEMA:  Swelling present L medial heel  MUSCLE LENGTH: Hamstrings: Right WNLs deg; Left WNLs deg Andy Bannister test: Right WNLs deg; Left WNLs deg  POSTURE:  Out toeing L>R  PALPATION: Medial border R heel  LOWER EXTREMITY ROM:  Active ROM Right eval Left eval  Hip flexion    Hip extension    Hip abduction    Hip adduction    Hip internal rotation    Hip external rotation    Knee flexion    Knee extension    Ankle dorsiflexion 11 15  Ankle plantarflexion 40 40  Ankle inversion    Ankle eversion     (Blank rows = not tested)  LOWER EXTREMITY MMT:  MMT Right eval Left eval  Hip flexion    Hip extension    Hip abduction    Hip adduction    Hip internal rotation    Hip external rotation    Knee flexion    Knee  extension    Ankle dorsiflexion 4+   Ankle plantarflexion 3/5   Ankle inversion 4+   Ankle eversion 4+    (Blank rows = not tested)  LOWER EXTREMITY SPECIAL TESTS:  Ankle special tests: NT  FUNCTIONAL TESTS:  2 minute walk test: TBA Single leg standing:   Left: 20 sec  Right: 17 sec  Single leg heel raises:   Unable to perform full range bilaterally   GAIT: Distance walked: 200' Assistive device utilized: None Level of assistance: Complete Independence Comments: Gait WNLS for limted distance  TREATMENT DATE:   [facility] Adult PT Treatment:                                                DATE: 11/14/2023  Therapeutic Exercise: Seated Dynadisc AROM x20 ankle pumps  x20 inversion/eversion Inversion/Eversion towel slides x 20 Towel scrunches bringing other end of the towel  1 round unresisted 1 round with 1# weight on towel  Therapeutic Activity: Nustep, level 4 x 5 minutes at start of session SLS cone taps from floor, 2 x 1 min alternating LE  Standing heel toe raises from airex pad x 20   Neuromuscular reeducation:  Neural sensitization techniques rubbing various textures (pillowcase, towel) across top of foot d/t persistent numbness -- responded best to towel  Patient education for independence with this at home Toe Yoga 2 x 1 min    [facility] Adult PT Treatment:                                                DATE: 11/12/2023  Therapeutic Exercise: Seated Ankle pumps 2 x 10  Inversion/Eversion towel slides 2 x 10  Towel scrunches bringing other end of the towel x 2 minutes  Resisted ankle 4-way with GTB, 2 x 10 each (DF, PF, inversion, eversion)   Therapeutic Activity: additional assessment of SL balance and SL heel raises Updated HEP and reviewed    [facility] Adult PT Treatment:                                                DATE:  11/07/23 Therapeutic Exercise: Developed, instructed in, and pt completed therex as noted in HEP  Self Care: RICE as needed for pain and swelling management     PATIENT EDUCATION:  Education details: Eval findings, POC, HEP, self care  Person educated: Patient Education method: Explanation, Demonstration, Tactile cues, Verbal cues, and Handouts Education comprehension: verbalized understanding, returned demonstration, verbal cues required, and tactile cues required  HOME EXERCISE PROGRAM: Access Code: [email or web address] URL: [email or web address] Date: 11/12/2023 Prepared by: Arlester Bence  Exercises - Long Sitting Calf Stretch with Strap (Mirrored)  - 1-2 x daily - 7 x weekly - 1 sets - 3 reps - 20 hold - Seated Toe Towel Scrunches  - 1 x daily - 7 x weekly - 1 sets - 20 reps - Toe Yoga - Alternating Great Toe and Lesser Toe Extension  - 1 x daily - 7 x weekly - 1 sets - 20 reps - Seated Heel Raise  - 1 x daily - 7 x weekly - 1 sets - 20 reps - 3 hold - Long Sitting Ankle Plantar Flexion with Resistance (Mirrored)  - 1 x daily - 7 x weekly - 1 sets - 20 reps - Seated Ankle Dorsiflexion with Resistance  - 1 x daily - 7 x weekly - 1 sets - 20 reps - Seated Ankle Eversion with Resistance  - 1 x daily - 7 x weekly - 1 sets - 20 reps - Seated Ankle Inversion with Resistance  - 1 x daily - 7 x weekly - 1 sets -  20 reps  ASSESSMENT:  CLINICAL IMPRESSION: 11/14/2023 Natalie Cordova had good tolerance of today's treatment session, which focused on progression of ankle and intrinsic mm strengthening program. We also increased SL stance time today. Patient had some difficulty with toe yoga, especially with great toe extension. She benefited from neural sensitization technique today and was instructed on independence with this following today's session. We will continue to progress per POC as tolerated, in order to reach established rehab goals.      Eval: Patient is a 48 y.o. female who was seen  today for physical therapy evaluation and treatment for  M72.2 (ICD-10-CM) - Plantar fasciitis of right foot  Z98.890 (ICD-10-CM) - Status post right foot surgery  Evaluate and treat for s/p endoscopic plantar fasciotomy right foot - DOS 09/26/23 - decrease swelling and pain. Pt presents with min decrease in R ankle DF AROM, tenderness along the medial border of the R heel, and decreased ability to PF the R foot/ankle in weight bearing and to tolerate walking longer distances due to heel pain. A HEP for R foot/ankle ROM and strengthening was started. Pt will benefit from skilled PT 2w6 to address impairments to optimize Rfoot/ankle function with less pain.   OBJECTIVE IMPAIRMENTS: decreased activity tolerance, decreased balance, difficulty walking, decreased ROM, decreased strength, increased edema, obesity, and pain.   ACTIVITY LIMITATIONS: squatting and locomotion level  PARTICIPATION LIMITATIONS: meal prep, cleaning, shopping, community activity, and occupation  PERSONAL FACTORS: Fitness, Past/current experiences, Time since onset of injury/illness/exacerbation, and 1-2 comorbidities: High BMI, OA both knees,anxiety  are also affecting patient's functional outcome.   REHAB POTENTIAL: Good  CLINICAL DECISION MAKING: Evolving/moderate complexity  EVALUATION COMPLEXITY: Moderate   GOALS:  SHORT TERM GOALS= LTGs  LONG TERM GOALS: Target date: 12/27/23  Pt will be Ind in a final HEP to maintain achieved LOF  Baseline: started Goal status: INITIAL  2.  Increased R ankle DF AROM to 15d for improved walking tolerance and ability to asc/dsc steps Baseline: R 11d Goal status: INITIAL  3.  Pt will be able to complete R single leg standing PF x20 reps for improved strength for improved walking tolerance. Baseline: TBA Goal status: INITIAL  4.  Pt will demonstrate single leg standing balance within 80% of the L for appropriate community ambulation Baseline: TBA Goal status: INITIAL  5.   Improve by MCID of 41ft as indication of improved functional mobility  Baseline: TBA Goal status: INITIAL  6.  Pt's LEFS score will improved to 60% or greater as indication of improved function  Baseline: 44% Goal status: INITIAL   PLAN:  PT FREQUENCY: 2x/week  PT DURATION: 6 weeks  PLANNED INTERVENTIONS: 97164- PT Re-evaluation, 97110-Therapeutic exercises, 97530- Therapeutic activity, [id number]- Neuromuscular re-education, 97535- Self Care, 16109- Manual therapy, [id number]- Gait training, [phone]- Electrical stimulation (unattended), [phone]- Ionotophoresis 4mg /ml Dexamethasone , Patient/Family education, Balance training, Stair training, Taping, Dry Needling, Cryotherapy, and Moist heat  PLAN FOR NEXT SESSION:  SL PF reps, and ; assess response to HEP; progress therex as indicated; use of modalities, manual therapy; and TPDN as indicated.   Arlester Bence, PT, DPT  11/14/2023 10:00 AM

## 2023-11-19 ENCOUNTER — Ambulatory Visit

## 2023-11-19 DIAGNOSIS — M79671 Pain in right foot: Secondary | ICD-10-CM | POA: Diagnosis not present

## 2023-11-19 DIAGNOSIS — R262 Difficulty in walking, not elsewhere classified: Secondary | ICD-10-CM

## 2023-11-19 DIAGNOSIS — M6281 Muscle weakness (generalized): Secondary | ICD-10-CM

## 2023-11-19 NOTE — Therapy (Signed)
 OUTPATIENT PHYSICAL THERAPY LOWER EXTREMITY  Patient Name: Natalie Cordova MRN: [id number] DOB:04-03-1976, 48 y.o., female Today's Date: 11/19/2023  END OF SESSION:  PT End of Session - 11/19/23 0941     Visit Number 3    Number of Visits 13    Date for PT Re-Evaluation 12/27/23    Authorization Type [employer]ET[employer]    PT Start Time 0935    PT Stop Time 1015    PT Time Calculation (min) 40 min    Activity Tolerance Patient limited by pain    Behavior During Therapy [facility] for tasks assessed/performed                Past Medical History:  Diagnosis Date   Anxiety    Asthma    Contraceptive management 03/07/2014   Depression    Essential hypertension    related to anxiety and stress per patient   Fibroids 07/22/2017   Heart palpitations    Migraine headache    Osteoarthritis of both knees    Plantar fasciitis of right foot    Past Surgical History:  Procedure Laterality Date   COLONOSCOPY WITH PROPOFOL  N/A 04/03/2022   Procedure: COLONOSCOPY WITH PROPOFOL ;  Surgeon: Suzette Espy, MD;  Location: [facility];  Service: Endoscopy;  Laterality: N/A;  7:30am   HYSTEROSCOPY N/A 11/07/2017   Procedure: HYSTEROSCOPY WITH HYDROTHERMAL ABLATION;  Surgeon: Albino Hum, MD;  Location: [facility];  Service: Gynecology;  Laterality: N/A;   LAPAROSCOPIC BILATERAL SALPINGECTOMY Bilateral 11/07/2017   Procedure: LAPAROSCOPIC BILATERAL SALPINGECTOMY;  Surgeon: Albino Hum, MD;  Location: [facility];  Service: Gynecology;  Laterality: Bilateral;   LEEP  1995   SUPRACERVICAL ABDOMINAL HYSTERECTOMY N/A 01/09/2022   Procedure: HYSTERECTOMY SUPRACERVICAL ABDOMINAL;  Surgeon: Wendelyn Halter, MD;  Location: [facility];  Service: Gynecology;  Laterality: N/A;   WISDOM TOOTH EXTRACTION     Patient Active Problem List   Diagnosis Date Noted   Postcoital and contact bleeding 09/02/2022   S/P supracervical hysterectomy 01/09/22:  Post-operative state 09/02/2022   Routine Papanicolaou smear 09/02/2022    S/P abdominal supracervical subtotal hysterectomy 09/02/2022   BMI 40.0-44.9, adult (HCC) 09/02/2022   Colon cancer screening 02/14/2022   Constipation 02/14/2022   Class 3 severe obesity without serious comorbidity with body mass index (BMI) of 40.0 to 44.9 in adult 02/14/2022   Dysmenorrhea    Fibroids, submucosal    Vaginal irritation 06/15/2020   Vaginal discharge 06/15/2020   Vaginal odor 06/15/2020   Scoliosis deformity of spine 05/25/2019   Accident while engaged in work-related activity 05/03/2019   Degeneration of lumbar intervertebral disc 05/03/2019   Low back pain 05/03/2019   Lumbar radiculopathy 05/03/2019   Menorrhagia with regular cycle 11/07/2017   Fibroid uterus 10/14/2017   Fibroids 07/22/2017   Heart palpitations 10/25/2016   Essential hypertension 10/25/2016   Encounter for gynecological examination with Papanicolaou smear of cervix 07/24/2016   Weight gain 07/24/2016   Body mass index 37.0-37.9, adult 07/24/2016   Weight loss counseling, encounter for 07/24/2016   Encounter for sterilization 07/24/2016   Pregnancy examination or test, negative result 05/29/2016   Irregular intermenstrual bleeding 05/29/2016   Contraceptive management 03/07/2014    PCP: Allwardt, Deleta Felix, PA-C  REFERRING PROVIDER: Clemetine Cypher, DPM  REFERRING DIAG:  M72.2 (ICD-10-CM) - Plantar fasciitis of right foot  Z98.890 (ICD-10-CM) - Status post right foot surgery    THERAPY DIAG:  Pain in right foot  Difficulty in walking, not elsewhere classified  Muscle weakness (generalized)  Rationale for Evaluation and Treatment: Rehabilitation  ONSET DATE: Endoscopic plantar fasciotomy right foot - DOS 09/26/23  SUBJECTIVE:   SUBJECTIVE STATEMENT: Patient continues to report improvement in pain, but the N/T on the top of her foot continues to persist. Pt is walkingon a treadmill at home.  PERTINENT HISTORY: OA both knees, anxiety  PAIN:  Are you having pain? Yes: NPRS  scale: 2/10. Pain range the week prior to PT: 1-7/10 Pain location: R medial heel Pain description: ache, throb, sharpe, constant Aggravating factors: First steps after sitting or in the AM, prolonged time on her feet Relieving factors: Compression sock, ice pack  PRECAUTIONS: None  RED FLAGS: None   WEIGHT BEARING RESTRICTIONS: No  FALLS:  Has patient fallen in last 6 months? 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  DIAGNOSTIC FINDINGS:  MRI R ankle /foot 07/24/23 IMPRESSION: 1. Moderate thickening of the medial band of the plantar fascia towards the calcaneal insertion consistent with plantar fasciitis with a small partial-thickness tear.  PATIENT SURVEYS:  LEFS 35/80=43%  COGNITION: Overall cognitive status: Within functional limits for tasks assessed     SENSATION: WFL  EDEMA:  Swelling present L medial heel  MUSCLE LENGTH: Hamstrings: Right WNLs deg; Left WNLs deg Andy Bannister test: Right WNLs deg; Left WNLs deg  POSTURE:  Out toeing L>R  PALPATION: Medial border R heel  LOWER EXTREMITY ROM:  Active ROM Right eval Left eval  Hip flexion    Hip extension    Hip abduction    Hip adduction    Hip internal rotation    Hip external rotation    Knee flexion    Knee extension    Ankle dorsiflexion 11 15  Ankle plantarflexion 40 40  Ankle inversion    Ankle eversion     (Blank rows = not tested)  LOWER EXTREMITY MMT:  MMT Right eval Left eval  Hip flexion    Hip extension    Hip abduction    Hip adduction    Hip internal rotation    Hip external rotation    Knee flexion    Knee extension    Ankle  dorsiflexion 4+   Ankle plantarflexion 3/5   Ankle inversion 4+   Ankle eversion 4+    (Blank rows = not tested)  LOWER EXTREMITY SPECIAL TESTS:  Ankle special tests: NT  FUNCTIONAL TESTS:  2 minute walk test: TBA Single leg standing:   Left: 20 sec  Right: 17 sec  Single leg heel raises:   Unable to perform full range bilaterally   GAIT: Distance walked: 200' Assistive device utilized: None Level of assistance: Complete Independence Comments: Gait WNLS for limted distance  TREATMENT DATE:   [facility] Adult PT Treatment:                                                DATE: 11/19/2023 Therapeutic Exercise: Inversion/Eversion towel slides x 20 Ankle DF/PF x20 Towel scrunches bringing other end of the towel  1 round unresisted 1 round with 1# weight on towel Therapeutic Activity: Nustep, level 5 x 5 minutes at start of session SLS cone taps from floor, 2 x 1 min alternating LE  Standing heel toe raises x20, from airex pad x20  Band ed side steps GTB at knees Neuromuscular reeducation:  Toe Yoga 2 x 1 min R SL standing 2x30", c opp leg medial/lateral taps 2x30" Marching on airex 2x20 STS c airex from raised mat table 2x10  [facility] Adult PT Treatment:                                                DATE: 11/12/2023 Therapeutic Exercise: Seated Ankle pumps 2 x 10  Inversion/Eversion towel slides 2 x 10  Towel scrunches bringing other end of the towel x 2 minutes  Resisted ankle 4-way with GTB, 2 x 10 each (DF, PF, inversion, eversion)   Therapeutic Activity: additional assessment of SL balance and SL heel raises Updated HEP and reviewed   PATIENT EDUCATION:  Education details: Eval findings, POC, HEP, self care  Person educated: Patient Education method: Explanation, Demonstration, Tactile cues, Verbal cues, and Handouts Education comprehension:  verbalized understanding, returned demonstration, verbal cues required, and tactile cues required  HOME EXERCISE PROGRAM: Access Code: [email or web address] URL: [email or web address] Date: 11/12/2023 Prepared by: Arlester Bence  Exercises - Long Sitting Calf Stretch with Strap (Mirrored)  - 1-2 x daily - 7 x weekly - 1 sets - 3 reps - 20 hold - Seated Toe Towel Scrunches  - 1 x daily - 7 x weekly - 1 sets - 20 reps - Toe Yoga - Alternating Great Toe and Lesser Toe Extension  - 1 x daily - 7 x weekly - 1 sets - 20 reps - Seated Heel Raise  - 1 x daily - 7 x weekly - 1 sets - 20 reps - 3 hold - Long Sitting Ankle Plantar Flexion with Resistance (Mirrored)  - 1 x daily - 7 x weekly - 1 sets - 20 reps - Seated Ankle Dorsiflexion with Resistance  - 1 x daily - 7 x weekly - 1 sets - 20 reps - Seated Ankle Eversion with Resistance  - 1 x daily - 7 x weekly - 1 sets - 20 reps - Seated Ankle Inversion with Resistance  - 1 x daily - 7 x weekly - 1 sets - 20 reps  ASSESSMENT:  CLINICAL IMPRESSION: 11/19/2023 PT was completed for R ankle /foot ROM, strength, and balance. Pt reports some increase with R medial heel discomfort with weight bearing of the R foot, but otherwise tolerated PT today without adverse effects. Pt is making appropriate progress with improved pain and function. Pt will continue to benefit from skilled PT to address impairments for improved R ankle/foot function.   Eval: Patient is a 48 y.o. female who was seen today for physical therapy evaluation and treatment for  M72.2 (ICD-10-CM) -  Plantar fasciitis of right foot  Z98.890 (ICD-10-CM) - Status post right foot surgery  Evaluate and treat for s/p endoscopic plantar fasciotomy right foot - DOS 09/26/23 - decrease swelling and pain. Pt presents with min decrease in R ankle DF AROM, tenderness along the medial border of the R heel, and decreased ability to PF the R foot/ankle in weight bearing and to tolerate walking longer distances  due to heel pain. A HEP for R foot/ankle ROM and strengthening was started. Pt will benefit from skilled PT 2w6 to address impairments to optimize Rfoot/ankle function with less pain.   OBJECTIVE IMPAIRMENTS: decreased activity tolerance, decreased balance, difficulty walking, decreased ROM, decreased strength, increased edema, obesity, and pain.   ACTIVITY LIMITATIONS: squatting and locomotion level  PARTICIPATION LIMITATIONS: meal prep, cleaning, shopping, community activity, and occupation  PERSONAL FACTORS: Fitness, Past/current experiences, Time since onset of injury/illness/exacerbation, and 1-2 comorbidities: High BMI, OA both knees,anxiety  are also affecting patient's functional outcome.   REHAB POTENTIAL: Good  CLINICAL DECISION MAKING: Evolving/moderate complexity  EVALUATION COMPLEXITY: Moderate   GOALS:  SHORT TERM GOALS= LTGs  LONG TERM GOALS: Target date: 12/27/23  Pt will be Ind in a final HEP to maintain achieved LOF  Baseline: started Goal status: INITIAL  2.  Increased R ankle DF AROM to 15d for improved walking tolerance and ability to asc/dsc steps Baseline: R 11d Goal status: INITIAL  3.  Pt will be able to complete R single leg standing PF x20 reps for improved strength for improved walking tolerance. Baseline: TBA Goal status: INITIAL  4.  Pt will demonstrate single leg standing balance within 80% of the L for appropriate community ambulation Baseline: TBA Goal status: INITIAL  5.  Improve by MCID of 55ft as indication of improved functional mobility  Baseline: TBA Goal status: INITIAL  6.  Pt's LEFS score will improved to 60% or greater as indication of improved function  Baseline: 44% Goal status: INITIAL   PLAN:  PT FREQUENCY: 2x/week  PT DURATION: 6 weeks  PLANNED INTERVENTIONS: 97164- PT Re-evaluation, 97110-Therapeutic exercises, 97530- Therapeutic activity, [id number]- Neuromuscular re-education, 97535- Self Care, 16109- Manual  therapy, [id number]- Gait training, [phone]- Electrical stimulation (unattended), [phone]- Ionotophoresis 4mg /ml Dexamethasone , Patient/Family education, Balance training, Stair training, Taping, Dry Needling, Cryotherapy, and Moist heat  PLAN FOR NEXT SESSION:  SL PF reps, and ; assess response to HEP; progress therex as indicated; use of modalities, manual therapy; and TPDN as indicated.   Shir Bergman MS, PT 11/19/23 10:19 AM

## 2023-11-21 ENCOUNTER — Ambulatory Visit: Admitting: Physical Therapy

## 2023-11-26 ENCOUNTER — Ambulatory Visit: Admitting: Physical Therapy

## 2023-11-26 ENCOUNTER — Encounter: Payer: Self-pay | Admitting: Physical Therapy

## 2023-11-26 DIAGNOSIS — M79671 Pain in right foot: Secondary | ICD-10-CM | POA: Diagnosis not present

## 2023-11-26 DIAGNOSIS — R262 Difficulty in walking, not elsewhere classified: Secondary | ICD-10-CM

## 2023-11-26 NOTE — Therapy (Signed)
 OUTPATIENT PHYSICAL THERAPY LOWER EXTREMITY  Patient Name: Natalie Cordova MRN: [id number] DOB:Jun 30, 1976, 48 y.o., female Today's Date: 11/26/2023  END OF SESSION:  PT End of Session - 11/26/23 0934     Visit Number 4    Number of Visits 13    Date for PT Re-Evaluation 12/27/23    Authorization Type [employer]ET[employer]    Progress Note Due on Visit 10    PT Start Time 0933    PT Stop Time 1015    PT Time Calculation (min) 42 min                Past Medical History:  Diagnosis Date   Anxiety    Asthma    Contraceptive management 03/07/2014   Depression    Essential hypertension    related to anxiety and stress per patient   Fibroids 07/22/2017   Heart palpitations    Migraine headache    Osteoarthritis of both knees    Plantar fasciitis of right foot    Past Surgical History:  Procedure Laterality Date   COLONOSCOPY WITH PROPOFOL  N/A 04/03/2022   Procedure: COLONOSCOPY WITH PROPOFOL ;  Surgeon: Suzette Espy, MD;  Location: [facility];  Service: Endoscopy;  Laterality: N/A;  7:30am   HYSTEROSCOPY N/A 11/07/2017   Procedure: HYSTEROSCOPY WITH HYDROTHERMAL ABLATION;  Surgeon: Albino Hum, MD;  Location: [facility];  Service: Gynecology;  Laterality: N/A;   LAPAROSCOPIC BILATERAL SALPINGECTOMY Bilateral 11/07/2017   Procedure: LAPAROSCOPIC BILATERAL SALPINGECTOMY;  Surgeon: Albino Hum, MD;  Location: [facility];  Service: Gynecology;  Laterality: Bilateral;   LEEP  1995   SUPRACERVICAL ABDOMINAL HYSTERECTOMY N/A 01/09/2022   Procedure: HYSTERECTOMY SUPRACERVICAL ABDOMINAL;  Surgeon: Wendelyn Halter, MD;  Location: [facility];  Service: Gynecology;  Laterality: N/A;   WISDOM TOOTH EXTRACTION     Patient Active Problem List   Diagnosis Date Noted   Postcoital and contact bleeding 09/02/2022   S/P supracervical hysterectomy 01/09/22:  Post-operative state 09/02/2022   Routine Papanicolaou smear 09/02/2022   S/P abdominal supracervical subtotal hysterectomy 09/02/2022   BMI  40.0-44.9, adult (HCC) 09/02/2022   Colon cancer screening 02/14/2022   Constipation 02/14/2022   Class 3 severe obesity without serious comorbidity with body mass index (BMI) of 40.0 to 44.9 in adult 02/14/2022   Dysmenorrhea    Fibroids, submucosal    Vaginal irritation 06/15/2020   Vaginal discharge 06/15/2020   Vaginal odor 06/15/2020   Scoliosis deformity of spine 05/25/2019   Accident while engaged in work-related activity 05/03/2019   Degeneration of lumbar intervertebral disc 05/03/2019   Low back pain 05/03/2019   Lumbar radiculopathy 05/03/2019   Menorrhagia with regular cycle 11/07/2017   Fibroid uterus 10/14/2017   Fibroids 07/22/2017   Heart palpitations 10/25/2016   Essential hypertension 10/25/2016   Encounter for gynecological examination with Papanicolaou smear of cervix 07/24/2016   Weight gain 07/24/2016   Body mass index 37.0-37.9, adult 07/24/2016   Weight loss counseling, encounter for 07/24/2016   Encounter for sterilization 07/24/2016   Pregnancy examination or test, negative result 05/29/2016   Irregular intermenstrual bleeding 05/29/2016   Contraceptive management 03/07/2014    PCP: Allwardt, Deleta Felix, PA-C  REFERRING PROVIDER: Clemetine Cypher, DPM  REFERRING DIAG:  M72.2 (ICD-10-CM) - Plantar fasciitis of right foot  Z98.890 (ICD-10-CM) - Status post right foot surgery    THERAPY DIAG:  Pain in right foot  Difficulty in walking, not elsewhere classified  Rationale for Evaluation and Treatment: Rehabilitation  ONSET DATE:  Endoscopic plantar fasciotomy right foot - DOS 09/26/23  SUBJECTIVE:   SUBJECTIVE STATEMENT: Patient reports discomfort only, but the N/T on the top of her foot continues to persist. She wore "cute" shoes to church over the weekend and had increased foot and leg pain.   PERTINENT HISTORY: OA both knees, anxiety  PAIN:  Are you having pain? Yes: NPRS scale: discomfort/10. Pain range the week prior to PT: 1-7/10 Pain  location: R medial heel Pain description: ache, throb, sharpe, constant Aggravating factors: First steps after sitting or in the AM, prolonged time on her feet Relieving factors: Compression sock, ice pack  PRECAUTIONS: None  RED FLAGS: None   WEIGHT BEARING RESTRICTIONS: No  FALLS:  Has patient fallen in last 6 months? 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 Moderate thickening of the medial band of the plantar fascia towards the calcaneal insertion consistent with plantar fasciitis with a small partial-thickness tear.  PATIENT SURVEYS:  LEFS 35/80=43%  COGNITION: Overall cognitive status: Within functional limits for tasks assessed     SENSATION: WFL  EDEMA:  Swelling present L medial heel  MUSCLE LENGTH: Hamstrings: Right WNLs deg; Left WNLs deg Andy Bannister test: Right WNLs deg; Left WNLs deg  POSTURE: Out toeing L>R  PALPATION: Medial border R heel  LOWER EXTREMITY ROM:  Active ROM Right eval Left eval  Hip flexion    Hip extension    Hip abduction    Hip adduction    Hip internal rotation    Hip external rotation    Knee flexion    Knee extension    Ankle dorsiflexion 11 15  Ankle plantarflexion 40 40  Ankle inversion    Ankle eversion     (Blank rows = not tested)  LOWER EXTREMITY MMT:  MMT Right eval Left eval Right / Left 11/26/23  Hip flexion     Hip extension     Hip abduction   4, 4  Hip adduction     Hip internal rotation     Hip external rotation     Knee flexion     Knee extension     Ankle dorsiflexion 4+    Ankle  plantarflexion 3/5    Ankle inversion 4+    Ankle eversion 4+     (Blank rows = not tested)  LOWER EXTREMITY SPECIAL TESTS:  Ankle special tests: NT  FUNCTIONAL TESTS:  2 minute walk test: TBA Single leg standing:   Left: 20 sec  Right: 17 sec  Single leg heel raises:   Unable to perform full range bilaterally   GAIT: Distance walked: 200' Assistive device utilized: None Level of assistance: Complete Independence Comments: Gait WNLS for limted distance  TREATMENT DATE:  [facility] Adult PT Treatment:                                                DATE: 11/26/23 Therapeutic Exercise: Right hip abduction to fatigue  Toe Yoga 2 x 1 min Towel scrunches bringing other end of the towel  [place] pick up Therapeutic Activity: Nustep, level 5 x 5 minutes at start of session Standing heel toe raises x20, heel raises x 20  Band ed side steps GTB at knees 4 x in bars  4 inch runners step up RT without UE support x 10 4inch lateral step down RT x 10 with UE support  Seated Baps L3 taps and circles  Neuromuscular reeducation:   SLS cone taps from floor, 2 x 1 min alternating LE ( last minute on AIREX without UE)  STS c airex from mat table 1 x 15 Tandem stance on Foam trials , each foot back without UE SLS trials 10 sec RLE x 3  Tandem gait in // bars 4 passes     [facility] Adult PT Treatment:                                                DATE: 11/26/2023 Therapeutic Exercise: Inversion/Eversion towel slides x 20 Ankle DF/PF x20 Towel scrunches bringing other end of the towel  1 round unresisted 1 round with 1# weight on towel Therapeutic Activity: Nustep, level 5 x 5 minutes at start of session SLS cone taps from floor, 2 x 1 min alternating LE  Standing heel toe raises x20, from airex pad x20  Band ed side steps GTB at knees Neuromuscular reeducation:  Toe  Yoga 2 x 1 min R SL standing 2x30", c opp leg medial/lateral taps 2x30" Marching on airex 2x20 STS c airex from raised mat table 2x10  [facility] Adult PT Treatment:                                                DATE: 11/12/2023 Therapeutic Exercise: Seated Ankle pumps 2 x 10  Inversion/Eversion towel slides 2 x 10  Towel scrunches bringing other end of the towel x 2 minutes  Resisted ankle 4-way with GTB, 2 x 10 each (DF, PF, inversion, eversion)   Therapeutic Activity: additional assessment of SL balance and SL heel raises Updated HEP and reviewed   PATIENT EDUCATION:  Education details: Eval findings, POC, HEP, self care  Person educated: Patient Education method: Explanation, Demonstration, Tactile cues, Verbal cues, and Handouts Education comprehension: verbalized understanding, returned demonstration, verbal cues required, and tactile cues required  HOME EXERCISE PROGRAM: Access Code: [email or web address] URL: [email or web address] Date: 11/12/2023 Prepared by: Arlester Bence  Exercises - Long Sitting Calf Stretch with Strap (Mirrored)  - 1-2 x daily - 7 x weekly - 1 sets - 3 reps - 20 hold - Seated Toe Towel Scrunches  - 1 x daily - 7 x weekly - 1 sets - 20 reps - Toe Yoga - Alternating Great Toe and Lesser Toe Extension  - 1 x daily - 7 x weekly - 1 sets -  20 reps - Seated Heel Raise  - 1 x daily - 7 x weekly - 1 sets - 20 reps - 3 hold - Long Sitting Ankle Plantar Flexion with Resistance (Mirrored)  - 1 x daily - 7 x weekly - 1 sets - 20 reps - Seated Ankle Dorsiflexion with Resistance  - 1 x daily - 7 x weekly - 1 sets - 20 reps - Seated Ankle Eversion with Resistance  - 1 x daily - 7 x weekly - 1 sets - 20 reps - Seated Ankle Inversion with Resistance  - 1 x daily - 7 x weekly - 1 sets - 20 reps  ASSESSMENT:  CLINICAL IMPRESSION: 11/26/2023 PT was completed for R ankle /foot ROM, strength, and balance. No complaints of pain during session. Has difficulty with toe  opposition.  Pt is making appropriate progress with improved pain and function. Pt will continue to benefit from skilled PT to address impairments for improved R ankle/foot function.   Eval: Patient is a 48 y.o. female who was seen today for physical therapy evaluation and treatment for  M72.2 (ICD-10-CM) - Plantar fasciitis of right foot  Z98.890 (ICD-10-CM) - Status post right foot surgery  Evaluate and treat for s/p endoscopic plantar fasciotomy right foot - DOS 09/26/23 - decrease swelling and pain. Pt presents with min decrease in R ankle DF AROM, tenderness along the medial border of the R heel, and decreased ability to PF the R foot/ankle in weight bearing and to tolerate walking longer distances due to heel pain. A HEP for R foot/ankle ROM and strengthening was started. Pt will benefit from skilled PT 2w6 to address impairments to optimize Rfoot/ankle function with less pain.   OBJECTIVE IMPAIRMENTS: decreased activity tolerance, decreased balance, difficulty walking, decreased ROM, decreased strength, increased edema, obesity, and pain.   ACTIVITY LIMITATIONS: squatting and locomotion level  PARTICIPATION LIMITATIONS: meal prep, cleaning, shopping, community activity, and occupation  PERSONAL FACTORS: Fitness, Past/current experiences, Time since onset of injury/illness/exacerbation, and 1-2 comorbidities: High BMI, OA both knees,anxiety are also affecting patient's functional outcome.   REHAB POTENTIAL: Good  CLINICAL DECISION MAKING: Evolving/moderate complexity  EVALUATION COMPLEXITY: Moderate   GOALS:  SHORT TERM GOALS= LTGs  LONG TERM GOALS: Target date: 12/27/23  Pt will be Ind in a final HEP to maintain achieved LOF  Baseline: started Goal status: INITIAL  2.  Increased R ankle DF AROM to 15d for improved walking tolerance and ability to asc/dsc steps Baseline: R 11d Goal status: INITIAL  3.  Pt will be able to complete R single leg standing PF x20 reps for improved  strength for improved walking tolerance. Baseline: TBA Goal status: INITIAL  4.  Pt will demonstrate single leg standing balance within 80% of the L for appropriate community ambulation Baseline: TBA Goal status: INITIAL  5.  Improve by MCID of 79ft as indication of improved functional mobility  Baseline: TBA Goal status: INITIAL  6.  Pt's LEFS score will improved to 60% or greater as indication of improved function  Baseline: 44% Goal status: INITIAL   PLAN:  PT FREQUENCY: 2x/week  PT DURATION: 6 weeks  PLANNED INTERVENTIONS: 97164- PT Re-evaluation, 97110-Therapeutic exercises, 97530- Therapeutic activity, [id number]- Neuromuscular re-education, 97535- Self Care, 09811- Manual therapy, [id number]- Gait training, [phone]- Electrical stimulation (unattended), [phone]- Ionotophoresis 4mg /ml Dexamethasone , Patient/Family education, Balance training, Stair training, Taping, Dry Needling, Cryotherapy, and Moist heat  PLAN FOR NEXT SESSION:    ; assess response to HEP; progress therex as indicated; use  of modalities, manual therapy; and TPDN as indicated.   Gasper Karst, PTA 11/26/23 11:21 AM Phone: [phone] Fax: [phone]

## 2023-11-27 NOTE — Therapy (Signed)
 OUTPATIENT PHYSICAL THERAPY LOWER EXTREMITY  Patient Name: RAFAELA ALLYN MRN: [id number] DOB:[birth year], 48 y.o., female Today's Date: 11/28/2023  END OF SESSION:  PT End of Session - 11/28/23 0938     Visit Number 5    Number of Visits 13    Date for PT Re-Evaluation 12/27/23    Authorization Type [employer]ET[employer]    PT Start Time 0933    PT Stop Time 1015    PT Time Calculation (min) 42 min    Activity Tolerance Patient limited by pain    Behavior During Therapy [facility] for tasks assessed/performed                 Past Medical History:  Diagnosis Date   Anxiety    Asthma    Contraceptive management 03/07/2014   Depression    Essential hypertension    related to anxiety and stress per patient   Fibroids 07/22/2017   Heart palpitations    Migraine headache    Osteoarthritis of both knees    Plantar fasciitis of right foot    Past Surgical History:  Procedure Laterality Date   COLONOSCOPY WITH PROPOFOL  N/A 04/03/2022   Procedure: COLONOSCOPY WITH PROPOFOL ;  Surgeon: Suzette Espy, MD;  Location: [facility];  Service: Endoscopy;  Laterality: N/A;  7:30am   HYSTEROSCOPY N/A 11/07/2017   Procedure: HYSTEROSCOPY WITH HYDROTHERMAL ABLATION;  Surgeon: Albino Hum, MD;  Location: [facility];  Service: Gynecology;  Laterality: N/A;   LAPAROSCOPIC BILATERAL SALPINGECTOMY Bilateral 11/07/2017   Procedure: LAPAROSCOPIC BILATERAL SALPINGECTOMY;  Surgeon: Albino Hum, MD;  Location: [facility];  Service: Gynecology;  Laterality: Bilateral;   LEEP  1995   SUPRACERVICAL ABDOMINAL HYSTERECTOMY N/A 01/09/2022   Procedure: HYSTERECTOMY SUPRACERVICAL ABDOMINAL;  Surgeon: Wendelyn Halter, MD;  Location: [facility];  Service: Gynecology;  Laterality: N/A;   WISDOM TOOTH EXTRACTION     Patient Active Problem List   Diagnosis Date Noted   Postcoital and contact bleeding 09/02/2022   S/P supracervical hysterectomy 01/09/22:  Post-operative state 09/02/2022   Routine Papanicolaou smear  09/02/2022   S/P abdominal supracervical subtotal hysterectomy 09/02/2022   BMI 40.0-44.9, adult (HCC) 09/02/2022   Colon cancer screening 02/14/2022   Constipation 02/14/2022   Class 3 severe obesity without serious comorbidity with body mass index (BMI) of 40.0 to 44.9 in adult 02/14/2022   Dysmenorrhea    Fibroids, submucosal    Vaginal irritation 06/15/2020   Vaginal discharge 06/15/2020   Vaginal odor 06/15/2020   Scoliosis deformity of spine 05/25/2019   Accident while engaged in work-related activity 05/03/2019   Degeneration of lumbar intervertebral disc 05/03/2019   Low back pain 05/03/2019   Lumbar radiculopathy 05/03/2019   Menorrhagia with regular cycle 11/07/2017   Fibroid uterus 10/14/2017   Fibroids 07/22/2017   Heart palpitations 10/25/2016   Essential hypertension 10/25/2016   Encounter for gynecological examination with Papanicolaou smear of cervix 07/24/2016   Weight gain 07/24/2016   Body mass index 37.0-37.9, adult 07/24/2016   Weight loss counseling, encounter for 07/24/2016   Encounter for sterilization 07/24/2016   Pregnancy examination or test, negative result 05/29/2016   Irregular intermenstrual bleeding 05/29/2016   Contraceptive management 03/07/2014    PCP: Allwardt, Deleta Felix, PA-C  REFERRING PROVIDER: Clemetine Cypher, DPM  REFERRING DIAG:  M72.2 (ICD-10-CM) - Plantar fasciitis of right foot  Z98.890 (ICD-10-CM) - Status post right foot surgery    THERAPY DIAG:  Pain in right foot  Difficulty in walking, not elsewhere  classified  Muscle weakness (generalized)  Rationale for Evaluation and Treatment: Rehabilitation  ONSET DATE: Endoscopic plantar fasciotomy right foot - DOS 09/26/23  SUBJECTIVE:   SUBJECTIVE STATEMENT: Pt reports primarily being pain with occasional medial incision pain  PERTINENT HISTORY: OA both knees, anxiety  PAIN:  Are you having pain? Yes: NPRS scale: 0/10. Pain range the week prior to PT: 1-7/10 Pain  location: R medial heel Pain description: ache, throb, sharpe, constant Aggravating factors: First steps after sitting or in the AM, prolonged time on her feet Relieving factors: Compression sock, ice pack  PRECAUTIONS: None  RED FLAGS: None   WEIGHT BEARING RESTRICTIONS: No  FALLS:  Has patient fallen in last 6 months? 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 Moderate thickening of the medial band of the plantar fascia towards the calcaneal insertion consistent with plantar fasciitis with a small partial-thickness tear.  PATIENT SURVEYS:  LEFS 35/80=43%  COGNITION: Overall cognitive status: Within functional limits for tasks assessed     SENSATION: WFL  EDEMA:  Swelling present L medial heel  MUSCLE LENGTH: Hamstrings: Right WNLs deg; Left WNLs deg Andy Bannister test: Right WNLs deg; Left WNLs deg  POSTURE: Out toeing L>R  PALPATION: Medial border R heel  LOWER EXTREMITY ROM:  Active ROM Right eval Left eval  Hip flexion    Hip extension    Hip abduction    Hip adduction    Hip internal rotation    Hip external rotation    Knee flexion    Knee extension    Ankle dorsiflexion 11 15  Ankle plantarflexion 40 40  Ankle inversion    Ankle eversion     (Blank rows = not tested)  LOWER EXTREMITY MMT:  MMT Right eval Left eval Right / Left 11/26/23  Hip flexion     Hip extension     Hip abduction   4, 4  Hip adduction     Hip internal rotation     Hip external rotation     Knee flexion     Knee extension     Ankle dorsiflexion 4+    Ankle  plantarflexion 3/5    Ankle inversion 4+    Ankle eversion 4+     (Blank rows = not tested)  LOWER EXTREMITY SPECIAL TESTS:  Ankle special tests: NT  FUNCTIONAL TESTS:  2 minute walk test: TBA Single leg standing:   Left: 20 sec  Right: 17 sec  Single leg heel raises:   Unable to perform full range bilaterally   GAIT: Distance walked: 200' Assistive device utilized: None Level of assistance: Complete Independence Comments: Gait WNLS for limted distance  TREATMENT DATE:  [facility] Adult PT Treatment:                                                DATE: 11/28/23 Therapeutic Activity: Rec bike, level 3 x 8 minutes at start of session Slant board stretch x2 30" [place] board A/P and laterally Standing SL heel raises x20, able to c R but more assist from hands SL balance, L 30", R 22" : 472' increased dorsal N/T, no limp Banded side steps RTB at feet 6x  Banded monster steps RTB at feet 6x   [facility] Adult PT Treatment:                                                DATE: 11/26/23 Therapeutic Exercise: Right hip abduction to fatigue  Toe Yoga 2 x 1 min Towel scrunches bringing other end of the towel  [place] pick up Therapeutic Activity: Nustep, level 5 x 5 minutes at start of session Standing heel toe raises x20, heel raises x 20  Band ed side steps GTB at knees 4 x in bars  4 inch runners step up RT without UE support x 10 4inch lateral step down RT x 10 with UE support  Seated Baps L3 taps and circles  Neuromuscular reeducation:   SLS cone taps from floor, 2 x 1 min alternating LE ( last minute on AIREX without UE)  STS c airex from mat table 1 x 15 Tandem stance on Foam trials , each foot back without UE SLS trials 10 sec RLE x 3  Tandem gait in // bars 4 passes   [facility] Adult PT Treatment:                                                DATE:  11/19/23 Therapeutic Exercise: Inversion/Eversion towel slides x 20 Ankle DF/PF x20 Towel scrunches bringing other end of the towel  1 round unresisted 1 round with 1# weight on towel Therapeutic Activity: Nustep, level 5 x 5 minutes at start of session SLS cone taps from floor, 2 x 1 min alternating LE  Standing heel toe raises x20, from airex pad x20  Band ed side steps GTB at knees Neuromuscular reeducation:  Toe Yoga 2 x 1 min R SL standing 2x30", c opp leg medial/lateral taps 2x30" Marching on airex 2x20 STS c airex from raised mat table 2x10  PATIENT EDUCATION:  Education details: Eval findings, POC, HEP, self care  Person educated: Patient Education method: Explanation, Demonstration, Tactile cues, Verbal cues, and Handouts Education comprehension: verbalized understanding, returned demonstration, verbal cues required, and tactile cues required  HOME EXERCISE PROGRAM: Access Code: [email or web address] URL: [email or web address] Date: 11/12/2023 Prepared by: Arlester Bence  Exercises - Long Sitting Calf Stretch with Strap (Mirrored)  - 1-2 x daily - 7 x weekly - 1 sets - 3 reps - 20 hold - Seated Toe Towel Scrunches  - 1 x daily - 7 x weekly - 1 sets - 20 reps - Toe Yoga - Alternating Great Toe and Lesser Toe Extension  - 1 x daily -  7 x weekly - 1 sets - 20 reps - Seated Heel Raise  - 1 x daily - 7 x weekly - 1 sets - 20 reps - 3 hold - Long Sitting Ankle Plantar Flexion with Resistance (Mirrored)  - 1 x daily - 7 x weekly - 1 sets - 20 reps - Seated Ankle Dorsiflexion with Resistance  - 1 x daily - 7 x weekly - 1 sets - 20 reps - Seated Ankle Eversion with Resistance  - 1 x daily - 7 x weekly - 1 sets - 20 reps - Seated Ankle Inversion with Resistance  - 1 x daily - 7 x weekly - 1 sets - 20 reps  ASSESSMENT:  CLINICAL IMPRESSION: 11/28/2023 PT was completed for R ankle /foot ROM, strength, balance, and function. Assessed balance and function with the R ankle/foot  doing well, but not quite at the level of the L. With , pt reported increased N/T on the dorsum of her R foot, but she did walk with a normal gait pattern s an limp. Pt tolerated PT today without adverse effects. Pt will continue to benefit from skilled PT to address impairments for improved R ankle/foot function.   Eval: Patient is a 47 y.o. female who was seen today for physical therapy evaluation and treatment for  M72.2 (ICD-10-CM) - Plantar fasciitis of right foot  Z98.890 (ICD-10-CM) - Status post right foot surgery  Evaluate and treat for s/p endoscopic plantar fasciotomy right foot - DOS 09/26/23 - decrease swelling and pain. Pt presents with min decrease in R ankle DF AROM, tenderness along the medial border of the R heel, and decreased ability to PF the R foot/ankle in weight bearing and to tolerate walking longer distances due to heel pain. A HEP for R foot/ankle ROM and strengthening was started. Pt will benefit from skilled PT 2w6 to address impairments to optimize Rfoot/ankle function with less pain.   OBJECTIVE IMPAIRMENTS: decreased activity tolerance, decreased balance, difficulty walking, decreased ROM, decreased strength, increased edema, obesity, and pain.   ACTIVITY LIMITATIONS: squatting and locomotion level  PARTICIPATION LIMITATIONS: meal prep, cleaning, shopping, community activity, and occupation  PERSONAL FACTORS: Fitness, Past/current experiences, Time since onset of injury/illness/exacerbation, and 1-2 comorbidities: High BMI, OA both knees,anxiety are also affecting patient's functional outcome.   REHAB POTENTIAL: Good  CLINICAL DECISION MAKING: Evolving/moderate complexity  EVALUATION COMPLEXITY: Moderate   GOALS:  SHORT TERM GOALS= LTGs  LONG TERM GOALS: Target date: 12/27/23  Pt will be Ind in a final HEP to maintain achieved LOF  Baseline: started Goal status: INITIAL  2.  Increased R ankle DF AROM to 15d for improved walking tolerance and ability  to asc/dsc steps Baseline: R 11d Goal status: INITIAL  3.  Pt will be able to complete R single leg standing PF x20 reps for improved strength for improved walking tolerance. Baseline: TBA 11/28/23: Standing SL heel raises x20, able to c R but more assist from hands Goal status: Improved  4.  Pt will demonstrate single leg standing balance within 80% of the L for appropriate community ambulation Baseline: TBA 11/28/23: SL balance, L 30", R 22" = 73% Goal status: INITIAL  5.  Improve by MCID of 63ft as indication of improved functional mobility  Baseline: TBA 11/28/23: 472' increased dorsal N/T, no limp Goal status: INITIAL  6.  Pt's LEFS score will improved to 60% or greater as indication of improved function  Baseline: 44% Goal status: INITIAL   PLAN:  PT FREQUENCY:  2x/week  PT DURATION: 6 weeks  PLANNED INTERVENTIONS: 97164- PT Re-evaluation, 97110-Therapeutic exercises, 97530- Therapeutic activity, [id number]- Neuromuscular re-education, 97535- Self Care, 86578- Manual therapy, [id number]- Gait training, [phone]- Electrical stimulation (unattended), [phone]- Ionotophoresis 4mg /ml Dexamethasone , Patient/Family education, Balance training, Stair training, Taping, Dry Needling, Cryotherapy, and Moist heat  PLAN FOR NEXT SESSION:    ; assess response to HEP; progress therex as indicated; use of modalities, manual therapy; and TPDN as indicated.   Ford Peddie MS, PT 11/28/23 10:25 AM

## 2023-11-28 ENCOUNTER — Ambulatory Visit

## 2023-11-28 DIAGNOSIS — R262 Difficulty in walking, not elsewhere classified: Secondary | ICD-10-CM

## 2023-11-28 DIAGNOSIS — M79671 Pain in right foot: Secondary | ICD-10-CM

## 2023-11-28 DIAGNOSIS — M6281 Muscle weakness (generalized): Secondary | ICD-10-CM

## 2023-12-09 ENCOUNTER — Encounter: Payer: Self-pay | Admitting: Podiatry

## 2023-12-09 ENCOUNTER — Ambulatory Visit (INDEPENDENT_AMBULATORY_CARE_PROVIDER_SITE_OTHER): Admitting: Podiatry

## 2023-12-09 DIAGNOSIS — S93691A Other sprain of right foot, initial encounter: Secondary | ICD-10-CM

## 2023-12-09 DIAGNOSIS — Z9889 Other specified postprocedural states: Secondary | ICD-10-CM

## 2023-12-09 NOTE — Addendum Note (Signed)
 Addended by: Pheobe Brass E on: 12/09/2023 10:04 AM   Modules accepted: Level of Service

## 2023-12-09 NOTE — Progress Notes (Signed)
 She presents today for follow-up of surgery date of surgery 09/26/2023.  Endoscopic plantar fasciotomy.  She states that she is doing pretty well though she overdid it this weekend she states that her pain is decreased considerably however she overdid it this weekend and now she has some swelling of the anterior ankle.  Objective: Vital signs are stable alert oriented x 3.  Pulses are palpable.  She has tenderness on palpation of the tibialis anterior where there is some swelling she also has pain on palpation of the medial calcaneal tubercle but much decreased from previous evaluation.  She still ambulates with a limp.  Assessment slowly resolving endoscopic plantar fasciotomy.  Date of surgery 09/26/2023.  Plan: Continue physical therapy for the next month follow-up with me in 1 month.  Continue out of work for another month.  We are also going to get her set up with [facility] for new set of orthotics.

## 2023-12-09 NOTE — Therapy (Signed)
 OUTPATIENT PHYSICAL THERAPY LOWER EXTREMITY  Patient Name: Natalie Cordova MRN: [id number] DOB:[birth year], 48 y.o., female Today's Date: 12/10/2023  END OF SESSION:  PT End of Session - 12/10/23 0857     Visit Number 6    Number of Visits 13    Date for PT Re-Evaluation 12/27/23    Authorization Type [employer]ET[employer]    PT Start Time 0850    PT Stop Time 0933    PT Time Calculation (min) 43 min    Activity Tolerance Patient limited by pain    Behavior During Therapy [facility] for tasks assessed/performed                  Past Medical History:  Diagnosis Date   Anxiety    Asthma    Contraceptive management 03/07/2014   Depression    Essential hypertension    related to anxiety and stress per patient   Fibroids 07/22/2017   Heart palpitations    Migraine headache    Osteoarthritis of both knees    Plantar fasciitis of right foot    Past Surgical History:  Procedure Laterality Date   COLONOSCOPY WITH PROPOFOL  N/A 04/03/2022   Procedure: COLONOSCOPY WITH PROPOFOL ;  Surgeon: Suzette Espy, MD;  Location: [facility];  Service: Endoscopy;  Laterality: N/A;  7:30am   HYSTEROSCOPY N/A 11/07/2017   Procedure: HYSTEROSCOPY WITH HYDROTHERMAL ABLATION;  Surgeon: Albino Hum, MD;  Location: [facility];  Service: Gynecology;  Laterality: N/A;   LAPAROSCOPIC BILATERAL SALPINGECTOMY Bilateral 11/07/2017   Procedure: LAPAROSCOPIC BILATERAL SALPINGECTOMY;  Surgeon: Albino Hum, MD;  Location: [facility];  Service: Gynecology;  Laterality: Bilateral;   LEEP  1995   SUPRACERVICAL ABDOMINAL HYSTERECTOMY N/A 01/09/2022   Procedure: HYSTERECTOMY SUPRACERVICAL ABDOMINAL;  Surgeon: Wendelyn Halter, MD;  Location: [facility];  Service: Gynecology;  Laterality: N/A;   WISDOM TOOTH EXTRACTION     Patient Active Problem List   Diagnosis Date Noted   Postcoital and contact bleeding 09/02/2022   S/P supracervical hysterectomy 01/09/22:  Post-operative state 09/02/2022   Routine Papanicolaou smear  09/02/2022   S/P abdominal supracervical subtotal hysterectomy 09/02/2022   BMI 40.0-44.9, adult (HCC) 09/02/2022   Colon cancer screening 02/14/2022   Constipation 02/14/2022   Class 3 severe obesity without serious comorbidity with body mass index (BMI) of 40.0 to 44.9 in adult 02/14/2022   Dysmenorrhea    Fibroids, submucosal    Vaginal irritation 06/15/2020   Vaginal discharge 06/15/2020   Vaginal odor 06/15/2020   Scoliosis deformity of spine 05/25/2019   Accident while engaged in work-related activity 05/03/2019   Degeneration of lumbar intervertebral disc 05/03/2019   Low back pain 05/03/2019   Lumbar radiculopathy 05/03/2019   Menorrhagia with regular cycle 11/07/2017   Fibroid uterus 10/14/2017   Fibroids 07/22/2017   Heart palpitations 10/25/2016   Essential hypertension 10/25/2016   Encounter for gynecological examination with Papanicolaou smear of cervix 07/24/2016   Weight gain 07/24/2016   Body mass index 37.0-37.9, adult 07/24/2016   Weight loss counseling, encounter for 07/24/2016   Encounter for sterilization 07/24/2016   Pregnancy examination or test, negative result 05/29/2016   Irregular intermenstrual bleeding 05/29/2016   Contraceptive management 03/07/2014    PCP: Allwardt, Deleta Felix, PA-C  REFERRING PROVIDER: Clemetine Cypher, DPM  REFERRING DIAG:  M72.2 (ICD-10-CM) - Plantar fasciitis of right foot  Z98.890 (ICD-10-CM) - Status post right foot surgery    THERAPY DIAG:  Pain in right foot  Difficulty in walking, not  elsewhere classified  Muscle weakness (generalized)  Rationale for Evaluation and Treatment: Rehabilitation  ONSET DATE: Endoscopic plantar fasciotomy right foot - DOS 09/26/23  SUBJECTIVE:   SUBJECTIVE STATEMENT: Pt reports experiencing pain and swelling on the top of her R foot after 2 hours of standing on both Sat and Sun. On Sun, she notes her R foot gave out when taking a step as well. The pain has resolved and the swelling is  better. She used a compression sleeve Sun evening. She saw Dr. Lara Plants on Mon and he has extended her time off from work until 01/12/24.  PERTINENT HISTORY: OA both knees, anxiety  PAIN:  Are you having pain? Yes: NPRS scale: 0/10. Pain range the week prior to PT: 1-7/10 Pain location: R medial heel Pain description: ache, throb, sharpe, constant Aggravating factors: First steps after sitting or in the AM, prolonged time on her feet Relieving factors: Compression sock, ice pack  PRECAUTIONS: None  RED FLAGS: None   WEIGHT BEARING RESTRICTIONS: No  FALLS:  Has patient fallen in last 6 months? 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 Moderate thickening of the medial band of the plantar fascia towards the calcaneal insertion consistent with plantar fasciitis with a small partial-thickness tear.  PATIENT SURVEYS:  LEFS 35/80=43%  COGNITION: Overall cognitive status: Within functional limits for tasks assessed     SENSATION: WFL  EDEMA:  Swelling present L medial heel  MUSCLE LENGTH: Hamstrings: Right WNLs deg; Left WNLs deg Andy Bannister test: Right WNLs deg; Left WNLs deg  POSTURE: Out toeing L>R  PALPATION: Medial border R heel  LOWER EXTREMITY ROM:  Active ROM Right eval Left eval  Hip flexion    Hip extension    Hip abduction    Hip adduction    Hip internal rotation    Hip external rotation    Knee flexion    Knee extension    Ankle dorsiflexion 11 15  Ankle plantarflexion 40 40  Ankle inversion    Ankle eversion     (Blank rows = not tested)  LOWER  EXTREMITY MMT:  MMT Right eval Left eval Right / Left 11/26/23  Hip flexion     Hip extension     Hip abduction   4, 4  Hip adduction     Hip internal rotation     Hip external rotation     Knee flexion     Knee extension     Ankle dorsiflexion 4+    Ankle plantarflexion 3/5    Ankle inversion 4+    Ankle eversion 4+     (Blank rows = not tested)  LOWER EXTREMITY SPECIAL TESTS:  Ankle special tests: NT  FUNCTIONAL TESTS:  2 minute walk test: TBA Single leg standing:   Left: 20 sec  Right: 17 sec  Single leg heel raises:   Unable to perform full range bilaterally   GAIT: Distance walked: 200' Assistive device utilized: None Level of assistance: Complete Independence Comments: Gait WNLS for limted distance  TREATMENT DATE:  [facility] Adult PT Treatment:                                                DATE: 12/10/23 Therapeutic Activity: Nustep, level 4 x 5 minutes at start of session Blue Rocker board A/P and laterally Standing SL heel raises x20, able to c R but more assist from hands SL balance each, then ABC c Pball for R only Alt lunges to bosu ball Banded side steps BluTB at knees Banded monster steps BluTB at knees 3 way ankle, RTB, 2x10  Self Care: Reminder for use of cold pack and elevation for the management of pain and swelling   [facility] Adult PT Treatment:                                                DATE: 11/28/23 Therapeutic Activity: Rec bike, level 3 x 8 minutes at start of session Slant board stretch x2 30" Blue Rocker board A/P and laterally Standing SL heel raises x20, able to c R but more assist from hands SL balance, L 30", R 22" : 472' increased dorsal N/T, no limp Banded side steps RTB at feet 6x  Banded monster steps RTB at feet 6x   PATIENT EDUCATION:  Education details: Eval findings, POC, HEP, self care   Person educated: Patient Education method: Explanation, Demonstration, Tactile cues, Verbal cues, and Handouts Education comprehension: verbalized understanding, returned demonstration, verbal cues required, and tactile cues required  HOME EXERCISE PROGRAM: Access Code: [email or web address] URL: [email or web address] Date: 11/12/2023 Prepared by: Arlester Bence  Exercises - Long Sitting Calf Stretch with Strap (Mirrored)  - 1-2 x daily - 7 x weekly - 1 sets - 3 reps - 20 hold - Seated Toe Towel Scrunches  - 1 x daily - 7 x weekly - 1 sets - 20 reps - Toe Yoga - Alternating Great Toe and Lesser Toe Extension  - 1 x daily - 7 x weekly - 1 sets - 20 reps - Seated Heel Raise  - 1 x daily - 7 x weekly - 1 sets - 20 reps - 3 hold - Long Sitting Ankle Plantar Flexion with Resistance (Mirrored)  - 1 x daily - 7 x weekly - 1 sets - 20 reps - Seated Ankle Dorsiflexion with Resistance  - 1 x daily - 7 x weekly - 1 sets - 20 reps - Seated Ankle Eversion with Resistance  - 1 x daily - 7 x weekly - 1 sets - 20 reps - Seated Ankle Inversion with Resistance  - 1 x daily - 7 x weekly - 1 sets - 20 reps  ASSESSMENT:  CLINICAL IMPRESSION: 12/10/2023 Pt continues to have  Pt continues to have min swelling of the anterior ankle in the area of the anterior tibialis, however this area is improving following this weekend's issue. Pt participated in PT for R ankle strengthening and balance. Pt found SL balance c Pball ABC challenging. Reminded pt of use of a cold pack and elevation for the management of pain and swelling. Pt reports completion of HEP every other day. Pt tolerated PT today without adverse effects. Pt will continue to benefit from skilled PT to address impairments for improved R ankle/foot  function.   Eval: Patient is a 48 y.o. female who was seen today for physical therapy evaluation and treatment for  M72.2 (ICD-10-CM) - Plantar fasciitis of right foot  Z98.890 (ICD-10-CM) - Status post right  foot surgery  Evaluate and treat for s/p endoscopic plantar fasciotomy right foot - DOS 09/26/23 - decrease swelling and pain. Pt presents with min decrease in R ankle DF AROM, tenderness along the medial border of the R heel, and decreased ability to PF the R foot/ankle in weight bearing and to tolerate walking longer distances due to heel pain. A HEP for R foot/ankle ROM and strengthening was started. Pt will benefit from skilled PT 2w6 to address impairments to optimize Rfoot/ankle function with less pain.   OBJECTIVE IMPAIRMENTS: decreased activity tolerance, decreased balance, difficulty walking, decreased ROM, decreased strength, increased edema, obesity, and pain.   ACTIVITY LIMITATIONS: squatting and locomotion level  PARTICIPATION LIMITATIONS: meal prep, cleaning, shopping, community activity, and occupation  PERSONAL FACTORS: Fitness, Past/current experiences, Time since onset of injury/illness/exacerbation, and 1-2 comorbidities: High BMI, OA both knees,anxiety are also affecting patient's functional outcome.   REHAB POTENTIAL: Good  CLINICAL DECISION MAKING: Evolving/moderate complexity  EVALUATION COMPLEXITY: Moderate   GOALS:  SHORT TERM GOALS= LTGs  LONG TERM GOALS: Target date: 12/27/23  Pt will be Ind in a final HEP to maintain achieved LOF  Baseline: started Goal status: Ongoing  2.  Increased R ankle DF AROM to 15d for improved walking tolerance and ability to asc/dsc steps Baseline: R 11d Goal status: INITIAL  3.  Pt will be able to complete R single leg standing PF x20 reps for improved strength for improved walking tolerance. Baseline: TBA 11/28/23: Standing SL heel raises x20, able to c R but more assist from hands Goal status: Improved  4.  Pt will demonstrate single leg standing balance within 80% of the L for appropriate community ambulation Baseline: TBA 11/28/23: SL balance, L 30", R 22" = 73% Goal status: Improved  5.  Improve by MCID of 67ft as  indication of improved functional mobility  Baseline: TBA 11/28/23: 472' increased dorsal N/T, no limp Goal status: INITIAL  6.  Pt's LEFS score will improved to 60% or greater as indication of improved function  Baseline: 44% Goal status: INITIAL   PLAN:  PT FREQUENCY: 2x/week  PT DURATION: 6 weeks  PLANNED INTERVENTIONS: 97164- PT Re-evaluation, 97110-Therapeutic exercises, 97530- Therapeutic activity, [id number]- Neuromuscular re-education, 97535- Self Care, 91478- Manual therapy, [id number]- Gait training, [phone]- Electrical stimulation (unattended), [id number]- Ionotophoresis 4mg /ml Dexamethasone , Patient/Family education, Balance training, Stair training, Taping, Dry Needling, Cryotherapy, and Moist heat  PLAN FOR NEXT SESSION:    ; assess response to HEP; progress therex as indicated; use of modalities, manual therapy; and TPDN as indicated.   Santiago Stenzel MS, PT 12/10/23 10:03 AM

## 2023-12-10 ENCOUNTER — Ambulatory Visit

## 2023-12-10 DIAGNOSIS — M6281 Muscle weakness (generalized): Secondary | ICD-10-CM

## 2023-12-10 DIAGNOSIS — M79671 Pain in right foot: Secondary | ICD-10-CM

## 2023-12-10 DIAGNOSIS — R262 Difficulty in walking, not elsewhere classified: Secondary | ICD-10-CM

## 2023-12-12 ENCOUNTER — Ambulatory Visit

## 2023-12-12 ENCOUNTER — Telehealth: Payer: Self-pay | Admitting: Podiatry

## 2023-12-12 DIAGNOSIS — M6281 Muscle weakness (generalized): Secondary | ICD-10-CM

## 2023-12-12 DIAGNOSIS — M79671 Pain in right foot: Secondary | ICD-10-CM

## 2023-12-12 DIAGNOSIS — R262 Difficulty in walking, not elsewhere classified: Secondary | ICD-10-CM

## 2023-12-12 NOTE — Therapy (Signed)
 OUTPATIENT PHYSICAL THERAPY NOTE  Patient Name: Natalie Cordova MRN: [id number] DOB:1975/08/24, 48 y.o., female Today's Date: 12/12/2023  END OF SESSION:         Past Medical History:  Diagnosis Date   Anxiety    Asthma    Contraceptive management 03/07/2014   Depression    Essential hypertension    related to anxiety and stress per patient   Fibroids 07/22/2017   Heart palpitations    Migraine headache    Osteoarthritis of both knees    Plantar fasciitis of right foot    Past Surgical History:  Procedure Laterality Date   COLONOSCOPY WITH PROPOFOL  N/A 04/03/2022   Procedure: COLONOSCOPY WITH PROPOFOL ;  Surgeon: Suzette Espy, MD;  Location: [facility];  Service: Endoscopy;  Laterality: N/A;  7:30am   HYSTEROSCOPY N/A 11/07/2017   Procedure: HYSTEROSCOPY WITH HYDROTHERMAL ABLATION;  Surgeon: Albino Hum, MD;  Location: [facility];  Service: Gynecology;  Laterality: N/A;   LAPAROSCOPIC BILATERAL SALPINGECTOMY Bilateral 11/07/2017   Procedure: LAPAROSCOPIC BILATERAL SALPINGECTOMY;  Surgeon: Albino Hum, MD;  Location: [facility];  Service: Gynecology;  Laterality: Bilateral;   LEEP  1995   SUPRACERVICAL ABDOMINAL HYSTERECTOMY N/A 01/09/2022   Procedure: HYSTERECTOMY SUPRACERVICAL ABDOMINAL;  Surgeon: Wendelyn Halter, MD;  Location: [facility];  Service: Gynecology;  Laterality: N/A;   WISDOM TOOTH EXTRACTION     Patient Active Problem List   Diagnosis Date Noted   Postcoital and contact bleeding 09/02/2022   S/P supracervical hysterectomy 01/09/22:  Post-operative state 09/02/2022   Routine Papanicolaou smear 09/02/2022   S/P abdominal supracervical subtotal hysterectomy 09/02/2022   BMI 40.0-44.9, adult (HCC) 09/02/2022   Colon cancer screening 02/14/2022   Constipation 02/14/2022   Class 3 severe obesity without serious comorbidity with body mass index (BMI) of 40.0 to 44.9 in adult 02/14/2022   Dysmenorrhea    Fibroids, submucosal    Vaginal irritation  06/15/2020   Vaginal discharge 06/15/2020   Vaginal odor 06/15/2020   Scoliosis deformity of spine 05/25/2019   Accident while engaged in work-related activity 05/03/2019   Degeneration of lumbar intervertebral disc 05/03/2019   Low back pain 05/03/2019   Lumbar radiculopathy 05/03/2019   Menorrhagia with regular cycle 11/07/2017   Fibroid uterus 10/14/2017   Fibroids 07/22/2017   Heart palpitations 10/25/2016   Essential hypertension 10/25/2016   Encounter for gynecological examination with Papanicolaou smear of cervix 07/24/2016   Weight gain 07/24/2016   Body mass index 37.0-37.9, adult 07/24/2016   Weight loss counseling, encounter for 07/24/2016   Encounter for sterilization 07/24/2016   Pregnancy examination or test, negative result 05/29/2016   Irregular intermenstrual bleeding 05/29/2016   Contraceptive management 03/07/2014    PCP: Allwardt, Deleta Felix, PA-C  REFERRING PROVIDER: Clemetine Cypher, DPM  REFERRING DIAG:  M72.2 (ICD-10-CM) - Plantar fasciitis of right foot  Z98.890 (ICD-10-CM) - Status post right foot surgery    THERAPY DIAG:  Pain in right foot  Difficulty in walking, not elsewhere classified  Muscle weakness (generalized)  Rationale for Evaluation and Treatment: Rehabilitation  ONSET DATE: Endoscopic plantar fasciotomy right foot - DOS 09/26/23  SUBJECTIVE:   SUBJECTIVE STATEMENT: Patient reporting no worsening of symptoms since last visit.   PERTINENT HISTORY: OA both knees, anxiety  PAIN:  Are you having pain? Yes: NPRS scale: 0/10. Pain range the week prior to PT: 1-7/10 Pain location: R medial heel Pain description: ache, throb, sharpe, constant Aggravating factors: First steps after sitting or in the AM, prolonged  time on her feet Relieving factors: Compression sock, ice pack  PRECAUTIONS: None  RED FLAGS: None   WEIGHT BEARING RESTRICTIONS: No  FALLS:  Has patient fallen in last 6 months? 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 Moderate thickening of the medial band of the plantar fascia towards the calcaneal insertion consistent with plantar fasciitis with a small partial-thickness tear.  PATIENT SURVEYS:  LEFS 35/80=43%  COGNITION: Overall cognitive status: Within functional limits for tasks assessed     SENSATION: WFL  EDEMA:  Swelling present L medial heel  MUSCLE LENGTH: Hamstrings: Right WNLs deg; Left WNLs deg Andy Bannister test: Right WNLs deg; Left WNLs deg  POSTURE: Out toeing L>R  PALPATION: Medial border R heel  LOWER EXTREMITY ROM:  Active ROM Right eval Left eval  Hip flexion    Hip extension    Hip abduction    Hip adduction    Hip internal rotation    Hip external rotation    Knee flexion    Knee extension    Ankle dorsiflexion 11 15  Ankle plantarflexion 40 40  Ankle inversion    Ankle eversion     (Blank rows = not tested)  LOWER EXTREMITY MMT:  MMT Right eval Left eval Right / Left 11/26/23  Hip flexion     Hip extension     Hip abduction   4, 4  Hip adduction     Hip internal rotation     Hip external rotation     Knee flexion     Knee extension     Ankle dorsiflexion 4+    Ankle plantarflexion 3/5    Ankle inversion 4+    Ankle eversion 4+     (Blank rows = not tested)  LOWER EXTREMITY SPECIAL TESTS:  Ankle special tests: NT  FUNCTIONAL TESTS:  2 minute walk test: TBA Single leg standing:   Left: 20 sec  Right: 17 sec  Single leg heel raises:   Unable to perform full range bilaterally   GAIT: Distance walked: 200' Assistive device  utilized: None Level of assistance: Complete Independence Comments: Gait WNLS for limted distance                                                                                                                                TREATMENT DATE:    [facility] Adult PT Treatment:  DATE: 12/12/2023  Therapeutic Activity: Nustep, level 4 x 5 minutes at start of session Blue Rocker board A/P and laterally Standing SL heel raises x20, able to c R but more assist from hands SL balance each, then ABC c Pball x2 rounds on each LE Alt lunges to bosu ball Banded side steps BluTB at knees Banded monster steps BluTB at knees 3 way ankle, RTB, 2x10    [facility] Adult PT Treatment:                                                DATE: 12/10/23 Therapeutic Activity: Nustep, level 4 x 5 minutes at start of session Blue Rocker board A/P and laterally Standing SL heel raises x20, able to c R but more assist from hands SL balance each, then ABC c Pball for R only Alt lunges to bosu ball Banded side steps BluTB at knees Banded monster steps BluTB at knees 3 way ankle, RTB, 2x10  Self Care: Reminder for use of cold pack and elevation for the management of pain and swelling   [facility] Adult PT Treatment:                                                DATE: 11/28/23 Therapeutic Activity: Rec bike, level 3 x 8 minutes at start of session Slant board stretch x2 30" Blue Rocker board A/P and laterally Standing SL heel raises x20, able to c R but more assist from hands SL balance, L 30", R 22" : 472' increased dorsal N/T, no limp Banded side steps RTB at feet 6x  Banded monster steps RTB at feet 6x   PATIENT EDUCATION:  Education details: Eval findings, POC, HEP, self care  Person educated: Patient Education method: Explanation, Demonstration, Tactile cues, Verbal cues, and Handouts Education comprehension: verbalized understanding, returned demonstration, verbal cues  required, and tactile cues required  HOME EXERCISE PROGRAM: Access Code: [email or web address] URL: [email or web address] Date: 11/12/2023 Prepared by: Arlester Bence  Exercises - Long Sitting Calf Stretch with Strap (Mirrored)  - 1-2 x daily - 7 x weekly - 1 sets - 3 reps - 20 hold - Seated Toe Towel Scrunches  - 1 x daily - 7 x weekly - 1 sets - 20 reps - Toe Yoga - Alternating Great Toe and Lesser Toe Extension  - 1 x daily - 7 x weekly - 1 sets - 20 reps - Seated Heel Raise  - 1 x daily - 7 x weekly - 1 sets - 20 reps - 3 hold - Long Sitting Ankle Plantar Flexion with Resistance (Mirrored)  - 1 x daily - 7 x weekly - 1 sets - 20 reps - Seated Ankle Dorsiflexion with Resistance  - 1 x daily - 7 x weekly - 1 sets - 20 reps - Seated Ankle Eversion with Resistance  - 1 x daily - 7 x weekly - 1 sets - 20 reps - Seated Ankle Inversion with Resistance  - 1 x daily - 7 x weekly - 1 sets - 20 reps  ASSESSMENT:  CLINICAL IMPRESSION: 12/12/2023 Cammy had improved tolerance of today's treatment session, which focused on weight bearing activities, including SLS. We will continue to progress per POC as tolerated, in order  to reach established rehab goals.    Eval: Patient is a 48 y.o. female who was seen today for physical therapy evaluation and treatment for  M72.2 (ICD-10-CM) - Plantar fasciitis of right foot  Z98.890 (ICD-10-CM) - Status post right foot surgery  Evaluate and treat for s/p endoscopic plantar fasciotomy right foot - DOS 09/26/23 - decrease swelling and pain. Pt presents with min decrease in R ankle DF AROM, tenderness along the medial border of the R heel, and decreased ability to PF the R foot/ankle in weight bearing and to tolerate walking longer distances due to heel pain. A HEP for R foot/ankle ROM and strengthening was started. Pt will benefit from skilled PT 2w6 to address impairments to optimize Rfoot/ankle function with less pain.   OBJECTIVE IMPAIRMENTS: decreased  activity tolerance, decreased balance, difficulty walking, decreased ROM, decreased strength, increased edema, obesity, and pain.   ACTIVITY LIMITATIONS: squatting and locomotion level  PARTICIPATION LIMITATIONS: meal prep, cleaning, shopping, community activity, and occupation  PERSONAL FACTORS: Fitness, Past/current experiences, Time since onset of injury/illness/exacerbation, and 1-2 comorbidities: High BMI, OA both knees,anxiety are also affecting patient's functional outcome.   REHAB POTENTIAL: Good  CLINICAL DECISION MAKING: Evolving/moderate complexity  EVALUATION COMPLEXITY: Moderate   GOALS:  SHORT TERM GOALS= LTGs  LONG TERM GOALS: Target date: 12/27/23  Pt will be Ind in a final HEP to maintain achieved LOF  Baseline: started Goal status: Ongoing  2.  Increased R ankle DF AROM to 15d for improved walking tolerance and ability to asc/dsc steps Baseline: R 11d Goal status: INITIAL  3.  Pt will be able to complete R single leg standing PF x20 reps for improved strength for improved walking tolerance. Baseline: TBA 11/28/23: Standing SL heel raises x20, able to c R but more assist from hands Goal status: Improved  4.  Pt will demonstrate single leg standing balance within 80% of the L for appropriate community ambulation Baseline: TBA 11/28/23: SL balance, L 30", R 22" = 73% Goal status: Improved  5.  Improve by MCID of 6ft as indication of improved functional mobility  Baseline: TBA 11/28/23: 472' increased dorsal N/T, no limp Goal status: INITIAL  6.  Pt's LEFS score will improved to 60% or greater as indication of improved function  Baseline: 44% Goal status: INITIAL   PLAN:  PT FREQUENCY: 2x/week  PT DURATION: 6 weeks  PLANNED INTERVENTIONS: 97164- PT Re-evaluation, 97110-Therapeutic exercises, 97530- Therapeutic activity, [id number]- Neuromuscular re-education, 97535- Self Care, 16109- Manual therapy, [id number]- Gait training, [phone]- Electrical stimulation  (unattended), [phone]- Ionotophoresis 4mg /ml Dexamethasone , Patient/Family education, Balance training, Stair training, Taping, Dry Needling, Cryotherapy, and Moist heat  PLAN FOR NEXT SESSION:    ; assess response to HEP; progress therex as indicated; use of modalities, manual therapy; and TPDN as indicated.   Arlester Bence, PT, DPT  12/15/2023 4:57 PM

## 2023-12-12 NOTE — Telephone Encounter (Signed)
 Patients handicap placard expires May 31 st. She is still out of work. Could you extend this date?

## 2023-12-16 ENCOUNTER — Encounter: Payer: Self-pay | Admitting: Podiatry

## 2023-12-16 ENCOUNTER — Ambulatory Visit: Payer: Self-pay | Attending: Podiatry

## 2023-12-16 DIAGNOSIS — M6281 Muscle weakness (generalized): Secondary | ICD-10-CM | POA: Insufficient documentation

## 2023-12-16 DIAGNOSIS — R262 Difficulty in walking, not elsewhere classified: Secondary | ICD-10-CM | POA: Insufficient documentation

## 2023-12-16 DIAGNOSIS — M79671 Pain in right foot: Secondary | ICD-10-CM | POA: Insufficient documentation

## 2023-12-16 NOTE — Telephone Encounter (Signed)
 Emailed note of revised RTW 01/12/24 date. See prev notes.

## 2023-12-16 NOTE — Therapy (Addendum)
 OUTPATIENT PHYSICAL THERAPY NOTE  Patient Name: Natalie Cordova MRN: [id number] DOB:11/07/1975, 48 y.o., female Today's Date: 12/16/2023  END OF SESSION:  PT End of Session - 12/16/23 1005     Visit Number 8    Number of Visits 13    Date for PT Re-Evaluation 12/27/23    Authorization Type [employer]ET[employer]    Progress Note Due on Visit 10    PT Start Time 1005    PT Stop Time 1044    PT Time Calculation (min) 39 min    Activity Tolerance Patient limited by pain    Behavior During Therapy [facility] for tasks assessed/performed                   Past Medical History:  Diagnosis Date   Anxiety    Asthma    Contraceptive management 03/07/2014   Depression    Essential hypertension    related to anxiety and stress per patient   Fibroids 07/22/2017   Heart palpitations    Migraine headache    Osteoarthritis of both knees    Plantar fasciitis of right foot    Past Surgical History:  Procedure Laterality Date   COLONOSCOPY WITH PROPOFOL  N/A 04/03/2022   Procedure: COLONOSCOPY WITH PROPOFOL ;  Surgeon: Suzette Espy, MD;  Location: [facility];  Service: Endoscopy;  Laterality: N/A;  7:30am   HYSTEROSCOPY N/A 11/07/2017   Procedure: HYSTEROSCOPY WITH HYDROTHERMAL ABLATION;  Surgeon: Albino Hum, MD;  Location: [facility];  Service: Gynecology;  Laterality: N/A;   LAPAROSCOPIC BILATERAL SALPINGECTOMY Bilateral 11/07/2017   Procedure: LAPAROSCOPIC BILATERAL SALPINGECTOMY;  Surgeon: Albino Hum, MD;  Location: [facility];  Service: Gynecology;  Laterality: Bilateral;   LEEP  1995   SUPRACERVICAL ABDOMINAL HYSTERECTOMY N/A 01/09/2022   Procedure: HYSTERECTOMY SUPRACERVICAL ABDOMINAL;  Surgeon: Wendelyn Halter, MD;  Location: [facility];  Service: Gynecology;  Laterality: N/A;   WISDOM TOOTH EXTRACTION     Patient Active Problem List   Diagnosis Date Noted   Postcoital and contact bleeding 09/02/2022   S/P supracervical hysterectomy 01/09/22:  Post-operative state 09/02/2022   Routine  Papanicolaou smear 09/02/2022   S/P abdominal supracervical subtotal hysterectomy 09/02/2022   BMI 40.0-44.9, adult (HCC) 09/02/2022   Colon cancer screening 02/14/2022   Constipation 02/14/2022   Class 3 severe obesity without serious comorbidity with body mass index (BMI) of 40.0 to 44.9 in adult 02/14/2022   Dysmenorrhea    Fibroids, submucosal    Vaginal irritation 06/15/2020   Vaginal discharge 06/15/2020   Vaginal odor 06/15/2020   Scoliosis deformity of spine 05/25/2019   Accident while engaged in work-related activity 05/03/2019   Degeneration of lumbar intervertebral disc 05/03/2019   Low back pain 05/03/2019   Lumbar radiculopathy 05/03/2019   Menorrhagia with regular cycle 11/07/2017   Fibroid uterus 10/14/2017   Fibroids 07/22/2017   Heart palpitations 10/25/2016   Essential hypertension 10/25/2016   Encounter for gynecological examination with Papanicolaou smear of cervix 07/24/2016   Weight gain 07/24/2016   Body mass index 37.0-37.9, adult 07/24/2016   Weight loss counseling, encounter for 07/24/2016   Encounter for sterilization 07/24/2016   Pregnancy examination or test, negative result 05/29/2016   Irregular intermenstrual bleeding 05/29/2016   Contraceptive management 03/07/2014    PCP: Allwardt, Deleta Felix, PA-C  REFERRING PROVIDER: Clemetine Cypher, DPM  REFERRING DIAG:  M72.2 (ICD-10-CM) - Plantar fasciitis of right foot  Z98.890 (ICD-10-CM) - Status post right foot surgery    THERAPY DIAG:  Pain in right foot  Difficulty in walking, not elsewhere classified  Muscle weakness (generalized)  Rationale for Evaluation and Treatment: Rehabilitation  ONSET DATE: Endoscopic plantar fasciotomy right foot - DOS 09/26/23  SUBJECTIVE:   SUBJECTIVE STATEMENT: Patient reporting some medial foot pain after last visit. She had some aching that resolved with ice and rest, within 24 hours.   PERTINENT HISTORY: OA both knees, anxiety  PAIN:  Are you having  pain? Yes: NPRS scale: 0/10. Pain range the week prior to PT: 1-7/10 Pain location: R medial heel Pain description: ache, throb, sharpe, constant Aggravating factors: First steps after sitting or in the AM, prolonged time on her feet Relieving factors: Compression sock, ice pack  PRECAUTIONS: None  RED FLAGS: None   WEIGHT BEARING RESTRICTIONS: No  FALLS:  Has patient fallen in last 6 months? 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 Moderate thickening of the medial band of the plantar fascia towards the calcaneal insertion consistent with plantar fasciitis with a small partial-thickness tear.  PATIENT SURVEYS:  LEFS 35/80=43%  COGNITION: Overall cognitive status: Within functional limits for tasks assessed     SENSATION: WFL  EDEMA:  Swelling present L medial heel  MUSCLE LENGTH: Hamstrings: Right WNLs deg; Left WNLs deg Andy Bannister test: Right WNLs deg; Left WNLs deg  POSTURE: Out toeing L>R  PALPATION: Medial border R heel  LOWER EXTREMITY ROM:  Active ROM Right eval Left eval  Hip flexion    Hip extension    Hip abduction    Hip adduction    Hip internal rotation    Hip external rotation    Knee flexion    Knee extension    Ankle dorsiflexion 11 15  Ankle plantarflexion 40 40  Ankle inversion    Ankle eversion     (Blank rows = not tested)  LOWER EXTREMITY MMT:  MMT Right eval Left eval Right / Left 11/26/23  Hip flexion     Hip extension     Hip abduction   4, 4  Hip adduction     Hip internal rotation     Hip external rotation     Knee  flexion     Knee extension     Ankle dorsiflexion 4+    Ankle plantarflexion 3/5    Ankle inversion 4+    Ankle eversion 4+     (Blank rows = not tested)  LOWER EXTREMITY SPECIAL TESTS:  Ankle special tests: NT  FUNCTIONAL TESTS:  2 minute walk test: TBA Single leg standing:   Left: 20 sec  Right: 17 sec  Single leg heel raises:   Unable to perform full range bilaterally   GAIT: Distance walked: 200' Assistive device utilized: None Level of assistance: Complete Independence Comments: Gait WNLS for limted distance  TREATMENT DATE:   [facility] Adult PT Treatment:                                                DATE: 12/16/2023  Therapeutic Activity: Nustep, level 5 x 5 minutes at start of session Blue Rocker board A/P and laterally x 60 sec each  Standing eccentric SL lowering from DL heel raises Q25 each SL balance each, then ABC c Pball x2 rounds on each LE Alt lunges to bosu ball 2 x 10 each  Sidestepping on airex beam x 2 laps each (heel hang, toe hang) Banded side steps BluTB at knees x 2 laps (40 feet per lap) Banded monster steps BluTB at knees  x 2 laps (40 feet per lap) Seated dynadisc AAROM PF/DF, pronation/supination, and circles x 15 each   [facility] Adult PT Treatment:                                                DATE: 12/12/2023  Therapeutic Activity: Nustep, level 4 x 5 minutes at start of session Blue Rocker board A/P and laterally Standing SL heel raises x20, able to c R but more assist from hands SL balance each, then ABC c Pball x2 rounds on each LE Alt lunges to bosu ball Banded side steps BluTB at knees Banded monster steps BluTB at knees 3 way ankle, RTB, 2x10    [facility] Adult PT Treatment:                                                DATE: 12/10/23 Therapeutic Activity: Nustep, level 4 x 5 minutes at start of session Blue  Rocker board A/P and laterally Standing SL heel raises x20, able to c R but more assist from hands SL balance each, then ABC c Pball for R only Alt lunges to bosu ball Banded side steps BluTB at knees Banded monster steps BluTB at knees 3 way ankle, RTB, 2x10  Self Care: Reminder for use of cold pack and elevation for the management of pain and swelling    PATIENT EDUCATION:  Education details: Eval findings, POC, HEP, self care  Person educated: Patient Education method: Explanation, Demonstration, Tactile cues, Verbal cues, and Handouts Education comprehension: verbalized understanding, returned demonstration, verbal cues required, and tactile cues required  HOME EXERCISE PROGRAM: Access Code: [email or web address] URL: [email or web address] Date: 11/12/2023 Prepared by: Arlester Bence  Exercises - Long Sitting Calf Stretch with Strap (Mirrored)  - 1-2 x daily - 7 x weekly - 1 sets - 3 reps - 20 hold - Seated Toe Towel Scrunches  - 1 x daily - 7 x weekly - 1 sets - 20 reps - Toe Yoga - Alternating Great Toe and Lesser Toe Extension  - 1 x daily - 7 x weekly - 1 sets - 20 reps - Seated Heel Raise  - 1 x daily - 7 x weekly - 1 sets - 20 reps - 3 hold - Long Sitting Ankle Plantar Flexion with Resistance (Mirrored)  - 1 x daily - 7 x weekly - 1 sets - 20 reps -  Seated Ankle Dorsiflexion with Resistance  - 1 x daily - 7 x weekly - 1 sets - 20 reps - Seated Ankle Eversion with Resistance  - 1 x daily - 7 x weekly - 1 sets - 20 reps - Seated Ankle Inversion with Resistance  - 1 x daily - 7 x weekly - 1 sets - 20 reps  ASSESSMENT:  CLINICAL IMPRESSION: 12/16/2023 [place] had improved tolerance of today's treatment session, which focused on progression of weight bearing tolerance, SL balance, and functional activities. Patient had most difficulty with side stepping with heel hang on foam balance beam, and SL balance with dual task. We will continue to progress per POC as tolerated, in  order to reach established rehab goals.     Eval: Patient is a 48 y.o. female who was seen today for physical therapy evaluation and treatment for  M72.2 (ICD-10-CM) - Plantar fasciitis of right foot  Z98.890 (ICD-10-CM) - Status post right foot surgery  Evaluate and treat for s/p endoscopic plantar fasciotomy right foot - DOS 09/26/23 - decrease swelling and pain. Pt presents with min decrease in R ankle DF AROM, tenderness along the medial border of the R heel, and decreased ability to PF the R foot/ankle in weight bearing and to tolerate walking longer distances due to heel pain. A HEP for R foot/ankle ROM and strengthening was started. Pt will benefit from skilled PT 2w6 to address impairments to optimize Rfoot/ankle function with less pain.   OBJECTIVE IMPAIRMENTS: decreased activity tolerance, decreased balance, difficulty walking, decreased ROM, decreased strength, increased edema, obesity, and pain.   ACTIVITY LIMITATIONS: squatting and locomotion level  PARTICIPATION LIMITATIONS: meal prep, cleaning, shopping, community activity, and occupation  PERSONAL FACTORS: Fitness, Past/current experiences, Time since onset of injury/illness/exacerbation, and 1-2 comorbidities: High BMI, OA both knees,anxiety are also affecting patient's functional outcome.   REHAB POTENTIAL: Good  CLINICAL DECISION MAKING: Evolving/moderate complexity  EVALUATION COMPLEXITY: Moderate   GOALS:  SHORT TERM GOALS= LTGs  LONG TERM GOALS: Target date: 12/27/23  Pt will be Ind in a final HEP to maintain achieved LOF  Baseline: started Goal status: Ongoing  2.  Increased R ankle DF AROM to 15d for improved walking tolerance and ability to asc/dsc steps Baseline: R 11d Goal status: INITIAL  3.  Pt will be able to complete R single leg standing PF x20 reps for improved strength for improved walking tolerance. Baseline: TBA 11/28/23: Standing SL heel raises x20, able to c R but more assist from hands Goal  status: Improved  4.  Pt will demonstrate single leg standing balance within 80% of the L for appropriate community ambulation Baseline: TBA 11/28/23: SL balance, L 30", R 22" = 73% Goal status: Improved  5.  Improve by MCID of 24ft as indication of improved functional mobility  Baseline: TBA 11/28/23: 472' increased dorsal N/T, no limp Goal status: INITIAL  6.  Pt's LEFS score will improved to 60% or greater as indication of improved function  Baseline: 44% Goal status: INITIAL   PLAN:  PT FREQUENCY: 2x/week  PT DURATION: 6 weeks  PLANNED INTERVENTIONS: 97164- PT Re-evaluation, 97110-Therapeutic exercises, 97530- Therapeutic activity, [id number]- Neuromuscular re-education, 97535- Self Care, [id number]- Manual therapy, [id number]- Gait training, [phone]- Electrical stimulation (unattended), [phone]- Ionotophoresis 4mg /ml Dexamethasone , Patient/Family education, Balance training, Stair training, Taping, Dry Needling, Cryotherapy, and Moist heat  PLAN FOR NEXT SESSION:    ; assess response to HEP; progress therex as indicated; use of modalities, manual therapy; and TPDN as indicated.  Arlester Bence, PT, DPT  12/16/2023 10:50 AM

## 2023-12-18 ENCOUNTER — Ambulatory Visit

## 2023-12-18 DIAGNOSIS — M79671 Pain in right foot: Secondary | ICD-10-CM | POA: Diagnosis not present

## 2023-12-18 DIAGNOSIS — R262 Difficulty in walking, not elsewhere classified: Secondary | ICD-10-CM

## 2023-12-18 DIAGNOSIS — M6281 Muscle weakness (generalized): Secondary | ICD-10-CM

## 2023-12-18 NOTE — Progress Notes (Signed)
 Orthotics   Patient was present and evaluated for Custom molded foot orthotics. Patient will benefit from CFO's to provide total contact to BIL MLA's helping to balance and distribute body weight more evenly across BIL feet helping to reduce plantar pressure and pain. Orthotic will also encourage FF / RF alignment  Patient was scanned today and will return for fitting upon receipt

## 2023-12-18 NOTE — Therapy (Signed)
 OUTPATIENT PHYSICAL THERAPY NOTE  Patient Name: Natalie Cordova MRN: [id number] DOB:[birth year], 48 y.o., female Today's Date: 12/18/2023  END OF SESSION:  PT End of Session - 12/18/23 1828     Visit Number 9    Number of Visits 13    Date for PT Re-Evaluation 12/27/23    Authorization Type [employer]ET[employer]    Progress Note Due on Visit 10    PT Start Time 1746    PT Stop Time 1824    PT Time Calculation (min) 38 min    Activity Tolerance Patient limited by fatigue    Behavior During Therapy [facility] for tasks assessed/performed               Past Medical History:  Diagnosis Date   Anxiety    Asthma    Contraceptive management 03/07/2014   Depression    Essential hypertension    related to anxiety and stress per patient   Fibroids 07/22/2017   Heart palpitations    Migraine headache    Osteoarthritis of both knees    Plantar fasciitis of right foot    Past Surgical History:  Procedure Laterality Date   COLONOSCOPY WITH PROPOFOL  N/A 04/03/2022   Procedure: COLONOSCOPY WITH PROPOFOL ;  Surgeon: Suzette Espy, MD;  Location: [facility];  Service: Endoscopy;  Laterality: N/A;  7:30am   HYSTEROSCOPY N/A 11/07/2017   Procedure: HYSTEROSCOPY WITH HYDROTHERMAL ABLATION;  Surgeon: Albino Hum, MD;  Location: [facility];  Service: Gynecology;  Laterality: N/A;   LAPAROSCOPIC BILATERAL SALPINGECTOMY Bilateral 11/07/2017   Procedure: LAPAROSCOPIC BILATERAL SALPINGECTOMY;  Surgeon: Albino Hum, MD;  Location: [facility];  Service: Gynecology;  Laterality: Bilateral;   LEEP  1995   SUPRACERVICAL ABDOMINAL HYSTERECTOMY N/A 01/09/2022   Procedure: HYSTERECTOMY SUPRACERVICAL ABDOMINAL;  Surgeon: Wendelyn Halter, MD;  Location: [facility];  Service: Gynecology;  Laterality: N/A;   WISDOM TOOTH EXTRACTION     Patient Active Problem List   Diagnosis Date Noted   Postcoital and contact bleeding 09/02/2022   S/P supracervical hysterectomy 01/09/22:  Post-operative state 09/02/2022   Routine  Papanicolaou smear 09/02/2022   S/P abdominal supracervical subtotal hysterectomy 09/02/2022   BMI 40.0-44.9, adult (HCC) 09/02/2022   Colon cancer screening 02/14/2022   Constipation 02/14/2022   Class 3 severe obesity without serious comorbidity with body mass index (BMI) of 40.0 to 44.9 in adult 02/14/2022   Dysmenorrhea    Fibroids, submucosal    Vaginal irritation 06/15/2020   Vaginal discharge 06/15/2020   Vaginal odor 06/15/2020   Scoliosis deformity of spine 05/25/2019   Accident while engaged in work-related activity 05/03/2019   Degeneration of lumbar intervertebral disc 05/03/2019   Low back pain 05/03/2019   Lumbar radiculopathy 05/03/2019   Menorrhagia with regular cycle 11/07/2017   Fibroid uterus 10/14/2017   Fibroids 07/22/2017   Heart palpitations 10/25/2016   Essential hypertension 10/25/2016   Encounter for gynecological examination with Papanicolaou smear of cervix 07/24/2016   Weight gain 07/24/2016   Body mass index 37.0-37.9, adult 07/24/2016   Weight loss counseling, encounter for 07/24/2016   Encounter for sterilization 07/24/2016   Pregnancy examination or test, negative result 05/29/2016   Irregular intermenstrual bleeding 05/29/2016   Contraceptive management 03/07/2014    PCP: Allwardt, Deleta Felix, PA-C  REFERRING PROVIDER: Clemetine Cypher, DPM  REFERRING DIAG:  M72.2 (ICD-10-CM) - Plantar fasciitis of right foot  Z98.890 (ICD-10-CM) - Status post right foot surgery    THERAPY DIAG:  Pain in right foot  Difficulty in walking, not elsewhere classified  Muscle weakness (generalized)  Rationale for Evaluation and Treatment: Rehabilitation  ONSET DATE: Endoscopic plantar fasciotomy right foot - DOS 09/26/23  SUBJECTIVE:   SUBJECTIVE STATEMENT: Patient reporting increased overall fatigue levels today d/t running errands earlier today and having today's session at the end of the day.   PERTINENT HISTORY: OA both knees, anxiety  PAIN:  Are  you having pain? Yes: NPRS scale: 0/10. Pain range the week prior to PT: 1-7/10 Pain location: R medial heel Pain description: ache, throb, sharpe, constant Aggravating factors: First steps after sitting or in the AM, prolonged time on her feet Relieving factors: Compression sock, ice pack  PRECAUTIONS: None  RED FLAGS: None   WEIGHT BEARING RESTRICTIONS: No  FALLS:  Has patient fallen in last 6 months? 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 Moderate thickening of the medial band of the plantar fascia towards the calcaneal insertion consistent with plantar fasciitis with a small partial-thickness tear.  PATIENT SURVEYS:  LEFS 35/80=43%  COGNITION: Overall cognitive status: Within functional limits for tasks assessed     SENSATION: WFL  EDEMA:  Swelling present L medial heel  MUSCLE LENGTH: Hamstrings: Right WNLs deg; Left WNLs deg Andy Bannister test: Right WNLs deg; Left WNLs deg  POSTURE: Out toeing L>R  PALPATION: Medial border R heel  LOWER EXTREMITY ROM:  Active ROM Right eval Left eval  Hip flexion    Hip extension    Hip abduction    Hip adduction    Hip internal rotation    Hip external rotation    Knee flexion    Knee extension    Ankle dorsiflexion 11 15  Ankle plantarflexion 40 40  Ankle inversion    Ankle eversion     (Blank rows = not tested)  LOWER EXTREMITY MMT:  MMT Right eval Left eval Right / Left 11/26/23  Hip flexion     Hip extension     Hip abduction   4, 4  Hip adduction     Hip internal rotation     Hip external  rotation     Knee flexion     Knee extension     Ankle dorsiflexion 4+    Ankle plantarflexion 3/5    Ankle inversion 4+    Ankle eversion 4+     (Blank rows = not tested)  LOWER EXTREMITY SPECIAL TESTS:  Ankle special tests: NT  FUNCTIONAL TESTS:  2 minute walk test: TBA Single leg standing:   Left: 20 sec  Right: 17 sec  Single leg heel raises:   Unable to perform full range bilaterally   GAIT: Distance walked: 200' Assistive device utilized: None Level of assistance: Complete Independence Comments: Gait WNLS for limted distance  TREATMENT DATE:   [facility] Adult PT Treatment:                                                DATE: 12/18/2023  Therapeutic Activity: Nustep, level 5 x 5 minutes at start of session Blue Rocker board A/P and laterally x 60 sec each  Standing eccentric SL lowering from DL heel raises U98 each  SL balance each, then ABC c Pball x2 rounds on each LE Alt lunges to bosu ball 2 x 10 each  Sidestepping on airex beam x 3 laps each (heel hang, toe hang) Seated dynadisc AAROM PF/DF, pronation/supination x 45 sec each Seated dynadisc AAROM cw/ccw circles x 10 each   [facility] Adult PT Treatment:                                                DATE: 12/16/2023  Therapeutic Activity: Nustep, level 5 x 5 minutes at start of session Blue Rocker board A/P and laterally x 60 sec each  Standing eccentric SL lowering from DL heel raises J19 each SL balance each, then ABC c Pball x2 rounds on each LE Alt lunges to bosu ball 2 x 10 each  Sidestepping on airex beam x 2 laps each (heel hang, toe hang) Banded side steps BluTB at knees x 2 laps (40 feet per lap) Banded monster steps BluTB at knees  x 2 laps (40 feet per lap) Seated dynadisc AAROM PF/DF, pronation/supination, and circles x 15 each   [facility] Adult PT Treatment:                                                 DATE: 12/12/2023  Therapeutic Activity: Nustep, level 4 x 5 minutes at start of session Blue Rocker board A/P and laterally Standing SL heel raises x20, able to c R but more assist from hands SL balance each, then ABC c Pball x2 rounds on each LE Alt lunges to bosu ball Banded side steps BluTB at knees Banded monster steps BluTB at knees 3 way ankle, RTB, 2x10     PATIENT EDUCATION:  Education details: Eval findings, POC, HEP, self care  Person educated: Patient Education method: Explanation, Demonstration, Tactile cues, Verbal cues, and Handouts Education comprehension: verbalized understanding, returned demonstration, verbal cues required, and tactile cues required  HOME EXERCISE PROGRAM: Access Code: [email or web address] URL: [email or web address] Date: 11/12/2023 Prepared by: Arlester Bence  Exercises - Long Sitting Calf Stretch with Strap (Mirrored)  - 1-2 x daily - 7 x weekly - 1 sets - 3 reps - 20 hold - Seated Toe Towel Scrunches  - 1 x daily - 7 x weekly - 1 sets - 20 reps - Toe Yoga - Alternating Great Toe and Lesser Toe Extension  - 1 x daily - 7 x weekly - 1 sets - 20 reps - Seated Heel Raise  - 1 x daily - 7 x weekly - 1 sets - 20 reps - 3 hold - Long Sitting Ankle Plantar Flexion with Resistance (Mirrored)  - 1 x daily - 7 x weekly - 1 sets -  20 reps - Seated Ankle Dorsiflexion with Resistance  - 1 x daily - 7 x weekly - 1 sets - 20 reps - Seated Ankle Eversion with Resistance  - 1 x daily - 7 x weekly - 1 sets - 20 reps - Seated Ankle Inversion with Resistance  - 1 x daily - 7 x weekly - 1 sets - 20 reps  ASSESSMENT:  CLINICAL IMPRESSION: 12/18/2023 Natalie Cordova had fair tolerance of today's treatment session, increased increased repetitions as tolerated, and ongoing SL balance activities. She required increased rest breaks towards end of session d/t overall fatigue level. Plan is to reassess progress towards rehab goals and extend POC if indicated.     Eval: Patient is a 48 y.o. female who was seen today for physical therapy evaluation and treatment for  M72.2 (ICD-10-CM) - Plantar fasciitis of right foot  Z98.890 (ICD-10-CM) - Status post right foot surgery  Evaluate and treat for s/p endoscopic plantar fasciotomy right foot - DOS 09/26/23 - decrease swelling and pain. Pt presents with min decrease in R ankle DF AROM, tenderness along the medial border of the R heel, and decreased ability to PF the R foot/ankle in weight bearing and to tolerate walking longer distances due to heel pain. A HEP for R foot/ankle ROM and strengthening was started. Pt will benefit from skilled PT 2w6 to address impairments to optimize Rfoot/ankle function with less pain.   OBJECTIVE IMPAIRMENTS: decreased activity tolerance, decreased balance, difficulty walking, decreased ROM, decreased strength, increased edema, obesity, and pain.   ACTIVITY LIMITATIONS: squatting and locomotion level  PARTICIPATION LIMITATIONS: meal prep, cleaning, shopping, community activity, and occupation  PERSONAL FACTORS: Fitness, Past/current experiences, Time since onset of injury/illness/exacerbation, and 1-2 comorbidities: High BMI, OA both knees,anxiety are also affecting patient's functional outcome.   REHAB POTENTIAL: Good  CLINICAL DECISION MAKING: Evolving/moderate complexity  EVALUATION COMPLEXITY: Moderate   GOALS:  SHORT TERM GOALS= LTGs  LONG TERM GOALS: Target date: 12/27/23  Pt will be Ind in a final HEP to maintain achieved LOF  Baseline: started Goal status: Ongoing  2.  Increased R ankle DF AROM to 15d for improved walking tolerance and ability to asc/dsc steps Baseline: R 11d Goal status: INITIAL  3.  Pt will be able to complete R single leg standing PF x20 reps for improved strength for improved walking tolerance. Baseline: TBA 11/28/23: Standing SL heel raises x20, able to c R but more assist from hands Goal status: Improved  4.  Pt will  demonstrate single leg standing balance within 80% of the L for appropriate community ambulation Baseline: TBA 11/28/23: SL balance, L 30", R 22" = 73% Goal status: Improved  5.  Improve by MCID of 86ft as indication of improved functional mobility  Baseline: TBA 11/28/23: 472' increased dorsal N/T, no limp Goal status: INITIAL  6.  Pt's LEFS score will improved to 60% or greater as indication of improved function  Baseline: 44% Goal status: INITIAL   PLAN:  PT FREQUENCY: 2x/week  PT DURATION: 6 weeks  PLANNED INTERVENTIONS: 97164- PT Re-evaluation, 97110-Therapeutic exercises, 97530- Therapeutic activity, [id number]- Neuromuscular re-education, 97535- Self Care, 47829- Manual therapy, [id number]- Gait training, [phone]- Electrical stimulation (unattended), [phone]- Ionotophoresis 4mg /ml Dexamethasone , Patient/Family education, Balance training, Stair training, Taping, Dry Needling, Cryotherapy, and Moist heat  PLAN FOR NEXT SESSION:    ; assess response to HEP; progress therex as indicated; use of modalities, manual therapy; and TPDN as indicated.   Arlester Bence, PT, DPT  12/18/2023 6:32 PM

## 2023-12-23 NOTE — Therapy (Signed)
 OUTPATIENT PHYSICAL THERAPY NOTE/DC summary  Patient Name: Natalie Cordova MRN: [id number] DOB:1975/12/06, 48 y.o., female Today's Date: 12/25/2023  END OF SESSION:  PT End of Session - 12/24/23 1630     Visit Number 10    Number of Visits 13    Date for PT Re-Evaluation 12/27/23    Authorization Type AET[employer]    PT Start Time 1630    PT Stop Time 1715    PT Time Calculation (min) 45 min    Activity Tolerance Patient limited by fatigue    Behavior During Therapy [facility] for tasks assessed/performed             Past Medical History:  Diagnosis Date   Anxiety    Asthma    Contraceptive management 03/07/2014   Depression    Essential hypertension    related to anxiety and stress per patient   Fibroids 07/22/2017   Heart palpitations    Migraine headache    Osteoarthritis of both knees    Plantar fasciitis of right foot    Past Surgical History:  Procedure Laterality Date   COLONOSCOPY WITH PROPOFOL  N/A 04/03/2022   Procedure: COLONOSCOPY WITH PROPOFOL ;  Surgeon: Suzette Espy, MD;  [facility];  Service: Endoscopy;  Laterality: N/A;  7:30am   HYSTEROSCOPY N/A 11/07/2017   Procedure: HYSTEROSCOPY WITH HYDROTHERMAL ABLATION;  Surgeon: Albino Hum, MD;  Location: [facility];  Service: Gynecology;  Laterality: N/A;   LAPAROSCOPIC BILATERAL SALPINGECTOMY Bilateral 11/07/2017   Procedure: LAPAROSCOPIC BILATERAL SALPINGECTOMY;  Surgeon: Albino Hum, MD;  Location: [facility];  Service: Gynecology;  Laterality: Bilateral;   LEEP  1995   SUPRACERVICAL ABDOMINAL HYSTERECTOMY N/A 01/09/2022   Procedure: HYSTERECTOMY SUPRACERVICAL ABDOMINAL;  Surgeon: Wendelyn Halter, MD;  Location: [facility];  Service: Gynecology;  Laterality: N/A;   WISDOM TOOTH EXTRACTION     Patient Active Problem List   Diagnosis Date Noted   Postcoital and contact bleeding 09/02/2022   S/P supracervical hysterectomy 01/09/22:  Post-operative state 09/02/2022   Routine Papanicolaou smear 09/02/2022    S/P abdominal supracervical subtotal hysterectomy 09/02/2022   BMI 40.0-44.9, adult (HCC) 09/02/2022   Colon cancer screening 02/14/2022   Constipation 02/14/2022   Class 3 severe obesity without serious comorbidity with body mass index (BMI) of 40.0 to 44.9 in adult 02/14/2022   Dysmenorrhea    Fibroids, submucosal    Vaginal irritation 06/15/2020   Vaginal discharge 06/15/2020   Vaginal odor 06/15/2020   Scoliosis deformity of spine 05/25/2019   Accident while engaged in work-related activity 05/03/2019   Degeneration of lumbar intervertebral disc 05/03/2019   Low back pain 05/03/2019   Lumbar radiculopathy 05/03/2019   Menorrhagia with regular cycle 11/07/2017   Fibroid uterus 10/14/2017   Fibroids 07/22/2017   Heart palpitations 10/25/2016   Essential hypertension 10/25/2016   Encounter for gynecological examination with Papanicolaou smear of cervix 07/24/2016   Weight gain 07/24/2016   Body mass index 37.0-37.9, adult 07/24/2016   Weight loss counseling, encounter for 07/24/2016   Encounter for sterilization 07/24/2016   Pregnancy examination or test, negative result 05/29/2016   Irregular intermenstrual bleeding 05/29/2016   Contraceptive management 03/07/2014    PCP: Allwardt, Deleta Felix, PA-C  REFERRING PROVIDER: Clemetine Cypher, DPM  REFERRING DIAG:  M72.2 (ICD-10-CM) - Plantar fasciitis of right foot  Z98.890 (ICD-10-CM) - Status post right foot surgery    THERAPY DIAG:  Pain in right foot  Difficulty in walking, not elsewhere classified  Muscle weakness (  generalized)  Rationale for Evaluation and Treatment: Rehabilitation  ONSET DATE: Endoscopic plantar fasciotomy right foot - DOS 09/26/23  SUBJECTIVE:   SUBJECTIVE STATEMENT: Patient reports being pleased with her progress and wishes to eb DC to HEP at this time.   PERTINENT HISTORY: OA both knees, anxiety  PAIN:  Are you having pain? Yes: NPRS scale: 0/10. Pain range the week prior to PT:  1-7/10 Pain location: R medial heel Pain description: ache, throb, sharpe, constant Aggravating factors: First steps after sitting or in the AM, prolonged time on her feet Relieving factors: Compression sock, ice pack  PRECAUTIONS: None  RED FLAGS: None   WEIGHT BEARING RESTRICTIONS: No  FALLS:  Has patient fallen in last 6 months? 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 Moderate thickening of the medial band of the plantar fascia towards the calcaneal insertion consistent with plantar fasciitis with a small partial-thickness tear.  PATIENT SURVEYS:  LEFS 35/80=43%  COGNITION: Overall cognitive status: Within functional limits for tasks assessed     SENSATION: WFL  EDEMA:  Swelling present L medial heel  MUSCLE LENGTH: Hamstrings: Right WNLs deg; Left WNLs deg Andy Bannister test: Right WNLs deg; Left WNLs deg  POSTURE: Out toeing L>R  PALPATION: Medial border R heel  LOWER EXTREMITY ROM:  Active ROM Right eval Left eval  Hip flexion    Hip extension    Hip abduction    Hip adduction    Hip internal rotation    Hip external rotation    Knee flexion    Knee extension    Ankle dorsiflexion 11 15  Ankle plantarflexion 40 40  Ankle inversion    Ankle eversion     (Blank rows = not tested)  LOWER EXTREMITY MMT:  MMT Right eval Left eval Right / Left 11/26/23  Hip flexion     Hip extension     Hip abduction   4, 4  Hip adduction     Hip internal rotation     Hip external rotation     Knee flexion     Knee extension     Ankle dorsiflexion 4+     Ankle plantarflexion 3/5    Ankle inversion 4+    Ankle eversion 4+     (Blank rows = not tested)  LOWER EXTREMITY SPECIAL TESTS:  Ankle special tests: NT  FUNCTIONAL TESTS:  2 minute walk test: TBA Single leg standing:   Left: 20 sec  Right: 17 sec  Single leg heel raises:   Unable to perform full range bilaterally   GAIT: Distance walked: 200' Assistive device utilized: None Level of assistance: Complete Independence Comments: Gait WNLS for limted distance  TREATMENT DATE:  [facility] Adult PT Treatment:                                                DATE: 12/24/23 Therapeutic Activity: Nustep, level 5 x 8 minutes at start of session LEFS SL balance, R 30+ sec Tandem balance Standing SL heel raises x10, able to c R but more assist from hands Standing bilat heel raises x20 Banded side steps BluTB at ankles Banded monster steps BluTB at ankles Final HEP  [facility] Adult PT Treatment:                                                DATE: 12/18/2023  Therapeutic Activity: Nustep, level 5 x 5 minutes at start of session Blue Rocker board A/P and laterally x 60 sec each  Standing eccentric SL lowering from DL heel raises Z61 each  SL balance each, then ABC c Pball x2 rounds on each LE Alt lunges to bosu ball 2 x 10 each  Sidestepping on airex beam x 3 laps each (heel hang, toe hang) Seated dynadisc AAROM PF/DF, pronation/supination x 45 sec each Seated dynadisc AAROM cw/ccw circles x 10 each   PATIENT EDUCATION:  Education details: Eval findings, POC, HEP, self care  Person educated: Patient Education method: Explanation, Demonstration, Tactile cues, Verbal cues, and Handouts Education comprehension: verbalized understanding, returned demonstration, verbal cues required, and tactile cues required  HOME EXERCISE PROGRAM: Access Code: [email or web address] URL:  [email or web address] Date: 12/24/2023 Prepared by: Liborio Reeds  Exercises - Seated Toe Towel Scrunches  - 1 x daily - 7 x weekly - 1 sets - 20 reps - Toe Yoga - Alternating Great Toe and Lesser Toe Extension  - 1 x daily - 7 x weekly - 1 sets - 20 reps - Long Sitting Ankle Plantar Flexion with Resistance (Mirrored)  - 1 x daily - 7 x weekly - 1 sets - 20 reps - Seated Ankle Dorsiflexion with Resistance  - 1 x daily - 7 x weekly - 1 sets - 20 reps - Seated Ankle Eversion with Resistance  - 1 x daily - 7 x weekly - 1 sets - 20 reps - Seated Ankle Inversion with Resistance  - 1 x daily - 7 x weekly - 1 sets - 20 reps - Heel Toe Raises with Counter Support  - 1 x daily - 7 x weekly - 1-2 sets - 15 reps - 2 hold - Standing Single Leg Stance with Counter Support  - 1 x daily - 7 x weekly - 1 sets - 3 reps - 30 hold - Standing Tandem Balance with Counter Support  - 1 x daily - 7 x weekly - 1 sets - 3 reps - 30 hold - Side Stepping with Resistance at Thighs  - 1 x daily - 7 x weekly - 3 sets - 10 reps - Diagonal Forward Stepping with Resistance Loop  - 1 x daily - 7 x weekly - 3 sets - 10 reps  ASSESSMENT:  CLINICAL IMPRESSION: Pt completed her course of PT making very good progress re: ROM, strength, functional and pain tolerance. All goals have been met or improved. PT was continued to today with therapeutic activities and a  final HEP was developed. Pt is in agreement with DC from from PT services at this time and is to continue with her HEP to maintain/improve her acheived LOF.  Eval: Patient is a 48 y.o. female who was seen today for physical therapy evaluation and treatment for  M72.2 (ICD-10-CM) - Plantar fasciitis of right foot  Z98.890 (ICD-10-CM) - Status post right foot surgery  Evaluate and treat for s/p endoscopic plantar fasciotomy right foot - DOS 09/26/23 - decrease swelling and pain. Pt presents with min decrease in R ankle DF AROM, tenderness along the medial border of  the R heel, and decreased ability to PF the R foot/ankle in weight bearing and to tolerate walking longer distances due to heel pain. A HEP for R foot/ankle ROM and strengthening was started. Pt will benefit from skilled PT 2w6 to address impairments to optimize Rfoot/ankle function with less pain.   OBJECTIVE IMPAIRMENTS: decreased activity tolerance, decreased balance, difficulty walking, decreased ROM, decreased strength, increased edema, obesity, and pain.   ACTIVITY LIMITATIONS: squatting and locomotion level  PARTICIPATION LIMITATIONS: meal prep, cleaning, shopping, community activity, and occupation  PERSONAL FACTORS: Fitness, Past/current experiences, Time since onset of injury/illness/exacerbation, and 1-2 comorbidities: High BMI, OA both knees,anxiety are also affecting patient's functional outcome.   REHAB POTENTIAL: Good  CLINICAL DECISION MAKING: Evolving/moderate complexity  EVALUATION COMPLEXITY: Moderate   GOALS:  SHORT TERM GOALS= LTGs  LONG TERM GOALS: Target date: 12/27/23  Pt will be Ind in a final HEP to maintain achieved LOF  Baseline: started Goal status: MET  2.  Increased R ankle DF AROM to 15d for improved walking tolerance and ability to asc/dsc steps Baseline: R 11d 12/24/23: R 19d Goal status: MET  3.  Pt will be able to complete R single leg standing PF x20 reps for improved strength for improved walking tolerance. Baseline: TBA 11/28/23: Standing SL heel raises x20, able to c R but more assist from hands 12/24/23:Standing SL heel raises x20, able to c R but more assist from hands Goal status: Improved  4.  Pt will demonstrate single leg standing balance within 80% of the L for appropriate community ambulation Baseline: TBA 11/28/23: SL balance, L 30, R 22 = 73% 12/24/23: R 30+ 100% Goal status: MET  5.  Improve by MCID of 68ft as indication of improved functional mobility  Baseline: TBA 11/28/23: 472' increased dorsal N/T, no  limp 12/24/23: 540, no limp Goal status: MET  6.  Pt's LEFS score will improved to 60% or greater as indication of improved function  Baseline: 44% 6/11//25: 64% Goal status: INITIAL   PLAN:  PT FREQUENCY: 2x/week  PT DURATION: 6 weeks  PLANNED INTERVENTIONS: 97164- PT Re-evaluation, 97110-Therapeutic exercises, 97530- Therapeutic activity, 97112- Neuromuscular re-education, 97535- Self Care, 56213- Manual therapy, [id number]- Gait training, [phone]- Electrical stimulation (unattended), [phone]- Ionotophoresis 4mg /ml Dexamethasone , Patient/Family education, Balance training, Stair training, Taping, Dry Needling, Cryotherapy, and Moist heat  PLAN FOR NEXT SESSION:    ; assess response to HEP; progress therex as indicated; use of modalities, manual therapy; and TPDN as indicated.   PHYSICAL THERAPY DISCHARGE SUMMARY  Visits from Start of Care: 10  Current functional level related to goals / functional outcomes: See clinical impression and PT goals    Remaining deficits: See clinical impression and PT goals    Education / Equipment: HEP/Pt Ed   Patient agrees to discharge. Patient goals were met. Patient is being discharged due to being pleased with the current functional level.  Dezaree Tracey MS, PT 12/25/23 6:25 AM

## 2023-12-24 ENCOUNTER — Ambulatory Visit

## 2023-12-24 DIAGNOSIS — R262 Difficulty in walking, not elsewhere classified: Secondary | ICD-10-CM

## 2023-12-24 DIAGNOSIS — M79671 Pain in right foot: Secondary | ICD-10-CM

## 2023-12-24 DIAGNOSIS — M6281 Muscle weakness (generalized): Secondary | ICD-10-CM

## 2024-01-01 ENCOUNTER — Ambulatory Visit (INDEPENDENT_AMBULATORY_CARE_PROVIDER_SITE_OTHER): Admitting: Physician Assistant

## 2024-01-01 ENCOUNTER — Encounter: Payer: Self-pay | Admitting: Physician Assistant

## 2024-01-01 VITALS — BP 117/73 | HR 84 | Temp 98.1°F | Ht 65.5 in | Wt 258.0 lb

## 2024-01-01 DIAGNOSIS — Z6841 Body Mass Index (BMI) 40.0 and over, adult: Secondary | ICD-10-CM

## 2024-01-01 DIAGNOSIS — Z1159 Encounter for screening for other viral diseases: Secondary | ICD-10-CM

## 2024-01-01 DIAGNOSIS — Z23 Encounter for immunization: Secondary | ICD-10-CM | POA: Diagnosis not present

## 2024-01-01 DIAGNOSIS — Z0000 Encounter for general adult medical examination without abnormal findings: Secondary | ICD-10-CM

## 2024-01-01 NOTE — Progress Notes (Signed)
 "   Patient ID: Natalie Cordova, female    DOB: Mar 16, 1976, 48 y.o.   MRN: [id number]   Assessment & Plan:  Encounter for annual physical exam -     CBC -     Comprehensive metabolic panel with GFR -     Hepatitis C antibody -     Lipid panel -     TSH -     Hemoglobin A1c -     Insulin , random -     Vitamin B12 -     VITAMIN D  25 Hydroxy (Vit-D Deficiency, Fractures)  Morbid obesity (HCC) -     CBC -     Comprehensive metabolic panel with GFR -     Hepatitis C antibody -     Lipid panel -     TSH -     Hemoglobin A1c -     Insulin , random -     Vitamin B12 -     VITAMIN D  25 Hydroxy (Vit-D Deficiency, Fractures)  Need for hepatitis C screening test -     Hepatitis C antibody  Need for tetanus booster -     Tdap vaccine greater than or equal to 7yo IM      Assessment and Plan Assessment & Plan Postoperative recovery from plantar fasciitis surgery Recovering from plantar fasciitis surgery on the right foot. Currently experiencing numbness and tingling, likely due to nerve involvement. Pain has improved significantly. Scheduled to return to work on June 30th and is concerned about wearing steel-toed boots. Inserts are expected in July to aid in support. - Continue physical therapy as needed - Monitor symptoms and adjust activity level as necessary - Use inserts when available in July to support foot during work  Weight management Experiencing difficulty with weight loss, potentially due to metabolic factors. Has tried Weight Watchers in the past with some success but has struggled recently due to physical limitations from foot surgery. Insurance does not cover weight loss medications, and she is hesitant to use oral medications due to side effects. Discussed potential for insulin  resistance and the impact of sleep apnea on weight management. Mentioned self-pay options for weight loss medications, with costs ranging from $399 to $499 for starting doses. Current  medications like Zepbound have about 50% insurance coverage for non-diabetics. - Order lab tests including glucose, A1c, insulin  levels, B12, and vitamin D  - Consider sleep study if symptoms of sleep apnea are present - Discuss self-pay options for weight loss medications if insurance coverage is not available  Constipation Experiences constipation and is currently taking Linzess  every other day. Reports that Linzess  can cause sleep disturbances, which is problematic with her work schedule. Discussed the potential benefit of probiotics to aid in bowel regularity. - Consider starting a probiotic to aid in bowel regularity - Adjust Linzess  dosing schedule to minimize sleep disturbances  Breast cancer screening Has a family history of breast cancer, including her mother. Underwent a mammogram in January, which required a follow-up ultrasound on the left breast. Currently on an annual mammogram schedule. Discussed the possibility of more frequent screenings or additional imaging like MRI due to strong family history. - Continue annual mammogram screenings - Discuss with gynecologist if more frequent screenings or additional imaging like MRI is needed  General Health Maintenance Up to date on colonoscopy, mammogram, and Pap smears. Due for a tetanus shot and has not had recent blood work. Discussed performing fasting blood work to check cell lines, sugar, liver, kidney, electrolytes, thyroid ,  cholesterol, and A1c. - Administer tetanus shot - Perform fasting blood work to check cell lines, sugar, liver, kidney, electrolytes, thyroid , cholesterol, and A1c      Return in about 6 months (around 07/02/2024) for recheck/follow-up.    Subjective:    Chief Complaint  Patient presents with   Annual Exam    Physical; want to discuss weight loss options and how to increase metabolism     HPI Discussed the use of AI scribe software for clinical note transcription with the patient, who gave  verbal consent to proceed.  History of Present Illness Natalie Cordova is a 48 year old female who presents for an annual physical exam.  She recently underwent surgery for plantar fasciitis on her right foot and is currently out of work. She wore a boot for six weeks and reports significant improvement, though she still experiences some numbness and tingling. She is scheduled to return to work on June 30th and is concerned about wearing steel-toed boots due to her foot condition. She has been using tennis shoes and is awaiting inserts to arrive in July.  There is a family history of breast cancer, including her mother. She has been undergoing annual mammograms and in January, she had to return for an ultrasound of the left breast after a mammogram, which was a step further than previous follow-ups. Despite the family history, she is currently on an annual recall for mammograms.  She reports difficulty with weight loss, attributing it to her inability to be physically active due to her foot condition. She has tried Weight Watchers in the past and lost some weight but has struggled recently. Her insurance does not cover weight loss medications, and she is hesitant to use pills due to side effects.  She experiences constipation and takes Linzess  every other day to manage her bowel movements. Linzess  can keep her up at night, which is problematic with her work schedule. She has not tried probiotics.  No new skin changes, rashes, or moles. No chest pain, shortness of breath, or changes in stool other than constipation.     Past Medical History:  Diagnosis Date   Anxiety    Asthma    Contraceptive management 03/07/2014   Depression    Essential hypertension    related to anxiety and stress per patient   Fibroids 07/22/2017   Heart palpitations    Migraine headache    Osteoarthritis of both knees    Plantar fasciitis of right foot     Past Surgical History:  Procedure Laterality Date    COLONOSCOPY WITH PROPOFOL  N/A 04/03/2022   Procedure: COLONOSCOPY WITH PROPOFOL ;  Surgeon: Shaaron Lamar HERO, MD;  [facility];  Service: Endoscopy;  Laterality: N/A;  7:30am   HYSTEROSCOPY N/A 11/07/2017   Procedure: HYSTEROSCOPY WITH HYDROTHERMAL ABLATION;  Surgeon: Edsel Norleen GAILS, MD;  Location: [facility];  Service: Gynecology;  Laterality: N/A;   LAPAROSCOPIC BILATERAL SALPINGECTOMY Bilateral 11/07/2017   Procedure: LAPAROSCOPIC BILATERAL SALPINGECTOMY;  Surgeon: Edsel Norleen GAILS, MD;  Location: [facility];  Service: Gynecology;  Laterality: Bilateral;   LEEP  1995   SUPRACERVICAL ABDOMINAL HYSTERECTOMY N/A 01/09/2022   Procedure: HYSTERECTOMY SUPRACERVICAL ABDOMINAL;  Surgeon: Jayne Vonn DEL, MD;  Location: [facility];  Service: Gynecology;  Laterality: N/A;   WISDOM TOOTH EXTRACTION      Family History  Problem Relation Age of Onset   Breast cancer Mother    Healthy Mother    Hypertension Mother    Healthy Father  Colon polyps Father        51s   High Cholesterol Father    Healthy Brother    Breast cancer Maternal Aunt    Breast cancer Maternal Aunt    Cancer Maternal Grandfather        lung   Colon cancer Neg Hx     Social History   Tobacco Use   Smoking status: Never   Smokeless tobacco: Never  Vaping Use   Vaping status: Never Used  Substance Use Topics   Alcohol use: Yes    Comment: occ beer or wine   Drug use: No     No Known Allergies  Review of Systems NEGATIVE UNLESS OTHERWISE INDICATED IN HPI      Objective:     BP 117/73   Pulse 84   Temp 98.1 F (36.7 C)   Ht 5' 5.5 (1.664 m)   Wt 258 lb (117 kg)   LMP 10/26/2021   SpO2 99%   BMI 42.28 kg/m   Wt Readings from Last 3 Encounters:  01/01/24 258 lb (117 kg)  07/07/23 260 lb 6.4 oz (118.1 kg)  09/02/22 257 lb (116.6 kg)    BP Readings from Last 3 Encounters:  01/01/24 117/73  07/07/23 118/77  05/20/23 (!) 144/84     Physical Exam Vitals and nursing note reviewed.   Constitutional:      Appearance: Normal appearance. She is obese. She is not toxic-appearing.  HENT:     Head: Normocephalic and atraumatic.     Right Ear: Tympanic membrane, ear canal and external ear normal.     Left Ear: Tympanic membrane, ear canal and external ear normal.     Nose: Nose normal.     Mouth/Throat:     Mouth: Mucous membranes are moist.   Eyes:     Extraocular Movements: Extraocular movements intact.     Conjunctiva/sclera: Conjunctivae normal.     Pupils: Pupils are equal, round, and reactive to light.    Cardiovascular:     Rate and Rhythm: Normal rate and regular rhythm.     Pulses: Normal pulses.     Heart sounds: Normal heart sounds.  Pulmonary:     Effort: Pulmonary effort is normal.     Breath sounds: Normal breath sounds.  Abdominal:     General: Abdomen is flat. Bowel sounds are normal.     Palpations: Abdomen is soft.   Musculoskeletal:        General: Normal range of motion.     Cervical back: Normal range of motion and neck supple.   Skin:    General: Skin is warm and dry.   Neurological:     General: No focal deficit present.     Mental Status: She is alert and oriented to person, place, and time.   Psychiatric:        Mood and Affect: Mood normal.        Behavior: Behavior normal.        Thought Content: Thought content normal.        Judgment: Judgment normal.             Marnesha Gagen M Casara Perrier, PA-C "

## 2024-01-02 LAB — COMPREHENSIVE METABOLIC PANEL WITH GFR
ALT: 15 U/L (ref 0–35)
AST: 17 U/L (ref 0–37)
Albumin: 4.3 g/dL (ref 3.5–5.2)
Alkaline Phosphatase: 49 U/L (ref 39–117)
BUN: 10 mg/dL (ref 6–23)
CO2: 26 meq/L (ref 19–32)
Calcium: 9.4 mg/dL (ref 8.4–10.5)
Chloride: 104 meq/L (ref 96–112)
Creatinine, Ser: 0.67 mg/dL (ref 0.40–1.20)
GFR: 102.2 mL/min (ref >60.00)
Glucose, Bld: 92 mg/dL (ref 70–99)
Potassium: 3.9 meq/L (ref 3.5–5.1)
Sodium: 138 meq/L (ref 135–145)
Total Bilirubin: 0.3 mg/dL (ref 0.2–1.2)
Total Protein: 7.4 g/dL (ref 6.0–8.3)

## 2024-01-02 LAB — INSULIN, RANDOM: Insulin: 19.2 u[IU]/mL — ABNORMAL HIGH

## 2024-01-02 LAB — LIPID PANEL
Cholesterol: 154 mg/dL (ref 0–200)
HDL: 53.4 mg/dL (ref >39.00)
LDL Cholesterol: 87 mg/dL (ref 0–99)
NonHDL: 100.43
Total CHOL/HDL Ratio: 3
Triglycerides: 65 mg/dL (ref 0.0–149.0)
VLDL: 13 mg/dL (ref 0.0–40.0)

## 2024-01-02 LAB — CBC
HCT: 40.7 % (ref 36.0–46.0)
Hemoglobin: 13.3 g/dL (ref 12.0–15.0)
MCHC: 32.7 g/dL (ref 30.0–36.0)
MCV: 87 fl (ref 78.0–100.0)
Platelets: 343 10*3/uL (ref 150.0–400.0)
RBC: 4.67 Mil/uL (ref 3.87–5.11)
RDW: 14.8 % (ref 11.5–15.5)
WBC: 6.2 10*3/uL (ref 4.0–10.5)

## 2024-01-02 LAB — HEMOGLOBIN A1C: Hgb A1c MFr Bld: 6 % (ref 4.6–6.5)

## 2024-01-02 LAB — VITAMIN B12: Vitamin B-12: 342 pg/mL (ref 211–911)

## 2024-01-02 LAB — HEPATITIS C ANTIBODY: Hepatitis C Ab: NONREACTIVE

## 2024-01-02 LAB — VITAMIN D 25 HYDROXY (VIT D DEFICIENCY, FRACTURES): VITD: 18.05 ng/mL — ABNORMAL LOW (ref 30.00–100.00)

## 2024-01-02 LAB — TSH: TSH: 1.32 u[IU]/mL (ref 0.35–5.50)

## 2024-01-05 ENCOUNTER — Ambulatory Visit: Payer: Self-pay | Admitting: Physician Assistant

## 2024-01-06 ENCOUNTER — Encounter: Admitting: Physician Assistant

## 2024-01-06 ENCOUNTER — Other Ambulatory Visit: Payer: Self-pay | Admitting: Physician Assistant

## 2024-01-06 MED ORDER — HYDROCHLOROTHIAZIDE 25 MG PO TABS: 25.0000 mg | ORAL_TABLET | Freq: Every day | ORAL | 1 refills | Status: AC

## 2024-01-06 MED ORDER — METOPROLOL SUCCINATE ER 50 MG PO TB24: 50.0000 mg | ORAL_TABLET | Freq: Every day | ORAL | 1 refills | Status: DC

## 2024-01-06 MED ORDER — LINACLOTIDE 145 MCG PO CAPS: 145.0000 ug | ORAL_CAPSULE | Freq: Every day | ORAL | 1 refills | Status: AC

## 2024-01-06 MED ORDER — SUMATRIPTAN SUCCINATE 100 MG PO TABS: 100.0000 mg | ORAL_TABLET | ORAL | 2 refills | Status: AC | PRN

## 2024-01-06 MED ORDER — PROAIR HFA 108 (90 BASE) MCG/ACT IN AERS: 2.0000 | INHALATION_SPRAY | RESPIRATORY_TRACT | 2 refills | Status: DC | PRN

## 2024-01-06 MED ORDER — CETIRIZINE HCL 10 MG PO TABS: 10.0000 mg | ORAL_TABLET | Freq: Every day | ORAL | 3 refills | Status: AC | PRN

## 2024-01-07 ENCOUNTER — Other Ambulatory Visit: Payer: Self-pay | Admitting: Podiatry

## 2024-01-08 ENCOUNTER — Other Ambulatory Visit: Payer: Self-pay

## 2024-01-08 ENCOUNTER — Ambulatory Visit: Admitting: Podiatry

## 2024-01-08 ENCOUNTER — Telehealth: Payer: Self-pay

## 2024-01-08 DIAGNOSIS — Z9889 Other specified postprocedural states: Secondary | ICD-10-CM

## 2024-01-08 DIAGNOSIS — S93691A Other sprain of right foot, initial encounter: Secondary | ICD-10-CM

## 2024-01-08 MED ORDER — GABAPENTIN 100 MG PO CAPS: 100.0000 mg | ORAL_CAPSULE | Freq: Three times a day (TID) | ORAL | 3 refills | Status: DC

## 2024-01-08 MED ORDER — VITAMIN D (ERGOCALCIFEROL) 1.25 MG (50000 UNIT) PO CAPS: 50000.0000 [IU] | ORAL_CAPSULE | ORAL | 0 refills | Status: DC

## 2024-01-08 NOTE — Telephone Encounter (Signed)
 Copied from CRM [id number]. Topic: Clinical - Medication Question >> Jan 08, 2024  9:44 AM Charolett L wrote: Reason for CRM: patient is wanting to know if she needs to purchase vitamin D  or wait for it to be prescribed as promised by the doctor and patient would like a call back  Returned pt call and apologized that CMA filling in did not send in the Rx, sent to pt pharmacy and pt verbalized understanding.

## 2024-01-09 NOTE — Progress Notes (Signed)
 She presents today for postop visit date of surgery 09/26/2023 right foot endoscopic plantar fasciotomy with PRP injection.  She states that is doing pretty well at this point.  Much better than it was.  She still has some numbness and tingling to the dorsum of the foot where the bandage was tight.  States that that tends to bother her more in the evenings and at night.  She has not picked up her orthotics yet she states that she is getting those on 17 July.  Objective: Vital signs are stable alert oriented x 3.  Pulses are palpable.  No pain on palpation medial calcaneal tubercle.  Some allodynia type symptomatology on stroking of the dorsal aspect of the foot.  Assessment: Well-healing right endoscopic plantar fasciotomy with dorsal neuritis.  Plan: Agrees to go ahead and get her started with gabapentin  100 mg 1 p.o. 3 times daily.  Making sure not to tie her shoes too tight supine to get her back to her regular activities so that she may return to work in the near future.

## 2024-01-26 ENCOUNTER — Ambulatory Visit (INDEPENDENT_AMBULATORY_CARE_PROVIDER_SITE_OTHER)

## 2024-01-26 DIAGNOSIS — Z9889 Other specified postprocedural states: Secondary | ICD-10-CM

## 2024-01-26 DIAGNOSIS — M722 Plantar fascial fibromatosis: Secondary | ICD-10-CM | POA: Diagnosis not present

## 2024-01-26 DIAGNOSIS — S93691A Other sprain of right foot, initial encounter: Secondary | ICD-10-CM | POA: Diagnosis not present

## 2024-01-26 DIAGNOSIS — M2142 Flat foot [pes planus] (acquired), left foot: Secondary | ICD-10-CM | POA: Diagnosis not present

## 2024-01-26 DIAGNOSIS — M2141 Flat foot [pes planus] (acquired), right foot: Secondary | ICD-10-CM

## 2024-01-26 NOTE — Progress Notes (Signed)
Patient presents today to pick up custom molded foot orthotics, diagnosed with PF by Dr. Al Corpus .   Orthotics were dispensed and fit was satisfactory. Reviewed instructions for break-in and wear. Written instructions given to patient.  Patient will follow up as needed.   Natalie Cordova Cped, CFo, CFm

## 2024-02-19 ENCOUNTER — Encounter: Payer: Self-pay | Admitting: Podiatry

## 2024-02-19 ENCOUNTER — Ambulatory Visit: Admitting: Podiatry

## 2024-02-19 DIAGNOSIS — S93691A Other sprain of right foot, initial encounter: Secondary | ICD-10-CM

## 2024-02-19 NOTE — Progress Notes (Signed)
 She presents today date of surgery 09/26/2023 endoscopic fasciotomy PRP injection.  She states that it still swells and is sore when it swells.  Emustil toed shoes seem to make it worse.  Objective: Vitals are stable alert oriented x 3.  She still has swelling in the right foot and leg but no pain on palpation of the calf.  So this appears to be more of a lymphedema type issue.  Assessment: Resolving plantar fascial type symptomatology after surgery but the edema continues to keep pain in the foot.  Possible lymphedema.  Plan: Instructed her to buy some support hose and start trying to wear that putting on first thing in the morning before she goes to work.  I will follow-up with her in about another month call me sooner if needed.

## 2024-02-22 NOTE — Progress Notes (Signed)
 Cardiology Office Note:    Date:  03/03/2024   ID:  Natalie Cordova, DOB 01-Jun-1976, MRN [id number]  PCP:  Kathrene Mardy HERO, PA-C  Cardiologist:  Alm Clay, MD     Referring MD: Kathrene Mardy HERO, PA-C   Chief Complaint: follow-up of palpitations   History of Present Illness:    Natalie Cordova is a 48 y.o. obese female with a history of palpitations with no significant arrhythmias noted on monitor in 07/2017,  hypertension, migraines, and anxiety/ depression who is followed by Dr. Clay and presents today for routine follow-up.   Patient was initially referred to Dr. Clay in 2018 for further evaluation of palpitations. Monitor in 07/2017 showed sinus rhythm with rare PACs/ PVCs but no significant arrhythmias. She was ultimately started on Toprol -XL improvement. She was last seen by [name], PA-C, in 05/2021 at which time she was doing well from a cardiac standpoint. Her palpitations were well controlled and she denied any other cardiac symptoms.   Patient presents today for overdue follow-up.  Overall, patient is doing well.  She reports 2 episodes of tachypalpitations when she was walking uphill in the heat.  Heart rates got as high as the 150s per her Apple watch but quickly came down when she got to level ground. She also reports one episode of tachy-palpitations at rest in June but this was in the setting of taking a weight loss pill. She has since stopped this and had no recurrence of tachy-palpitations at rest.  She also reports some worsening dyspnea on exertion over the last few months and states she will even get winded when going up a few stairs in her house.  However, she states this is actually getting easier.  Suspect symptoms are due to weight gain and deconditioning.  She had surgery for plantar fasciitis on her right foot in 09/2023 and has gained a lot of weight since then. Her weight peaked at 262 lbs recently. However, she has since started increasing  her activity and trying to watch her diet. Her weight is down to 257 lbs today. Otherwise, she is doing well. No chest pain, shortness of breath at rest, orthopnea, PND, edema, lightheadedness/ dizziness, or syncope.   EKGs/Labs/Other Studies Reviewed:    The following studies were reviewed:  Monitor 07/2017: Predominant rhythm is normal sinus rhythm. There is some sinus bradycardia and some sinus tachycardia with sinus arrhythmia. Heart rate ranged from 57-125 bpm. Fastest rate was 155 bpm Rare isolated PACs and PVCs. Occasional dropped due to arrhythmia. No arrhythmia noted.   Essentially normal monitor. Patient did not notice a rapid sinus tachycardia Long external sinus tachycardia was on January 22 12:39 PM-1:46 PM (rate ranged from 120-155 bpm.  EKG:  EKG ordered today.   EKG Interpretation Date/Time:  Wednesday March 03 2024 14:05:27 EDT Ventricular Rate:  79 PR Interval:  172 QRS Duration:  78 QT Interval:  394 QTC Calculation: 451 R Axis:   35  Text Interpretation: Normal sinus rhythm Normal ECG Confirmed by Mayeli Bornhorst [phone]) on 03/03/2024 2:15:05 PM    Recent Labs: 01/01/2024: ALT 15; BUN 10; Creatinine, Ser 0.67; Hemoglobin 13.3; Platelets 343.0; Potassium 3.9; Sodium 138; TSH 1.32  Recent Lipid Panel    Component Value Date/Time   CHOL 154 01/01/2024 1513   TRIG 65.0 01/01/2024 1513   HDL 53.40 01/01/2024 1513   CHOLHDL 3 01/01/2024 1513   VLDL 13.0 01/01/2024 1513   LDLCALC 87 01/01/2024 1513    Physical Exam:  Vital Signs: BP 126/72   Pulse 79   Ht 5' 5.5 (1.664 m)   Wt 257 lb 6.4 oz (116.8 kg)   LMP 10/26/2021   SpO2 97%   BMI 42.18 kg/m     Wt Readings from Last 3 Encounters:  03/03/24 257 lb 6.4 oz (116.8 kg)  01/01/24 258 lb (117 kg)  07/07/23 260 lb 6.4 oz (118.1 kg)     General: 48 y.o. obese African-American female in no acute distress. HEENT: Normocephalic and atraumatic. Sclera clear.  Neck: Supple. No carotid bruits. No  JVD. Heart: RRR. Distinct S1 and S2. No murmurs, gallops, or rubs.  Lungs: No increased work of breathing. Clear to ausculation bilaterally. No wheezes, rhonchi, or rales.  Extremities: No lower extremity edema.   Skin: Warm and dry. Neuro: No focal deficits. Psych: Normal affect. Responds appropriately.   Assessment:    1. Palpitations   2. Dyspnea on exertion   3. Essential hypertension   4. Class 3 severe obesity without serious comorbidity with body mass index (BMI) of 40.0 to 44.9 in adult, unspecified obesity type     Plan:    Palpitations Long history of palpitations. Monitor in 2019 showed rare PACs/ PVCs but no significant arrhythmias. Palpitations have been well controlled on Toprol -XL for years.  - She reports couple of episodes of tachy-palpitations when walking uphill in the heat that resolve quickly when getting to level ground. - Continue Toprol -XL 50mg  daily. - Suspect symptoms are due to deconditioning. Recommended increasing physical activity as tolerated and weight loss. Patient has an Apple watch with EKG capability. She will continue to monitor episodes with this. Advised her to let us  know if episodes worsen and can consider formal monitor.   Dyspnea on Exertion Patient reports dyspnea on exertion that has worsened over the last few months. This occurred in the setting of weight gain after foot surgery as she was not able to be as active. No chest pain.  - EKG today shows normal sinus rhythm with no acute ischemic changes. - Euvolemic on exam. No other signs or symptoms of CHF.  - Suspect symptoms due to deconditioning and obesity. However, I did offer an Echo. Patient politely declined and would like to try increasing her activity and losing weight first. Advised patient to let us  know if symptoms worse or if she has quick weight gain (3lbs in 1 day or 5lbs in 1 week).   Hypertension BP well controlled.  - Continue current medications: HCTZ 25mg  daily and  Toprol -XL 50mg  daily.   Obesity  BMI 42.18. She has had some gradual weight gain over the last year, especially after she had foot surgery in 09/2023.  - Recommended increasing physical activity and weight loss. She has recently made some significant changes in her diet and is starting to increase her physical activity again. Encouraged her to continue with this.  - I am not sure if her insurance would cover a GLP agonist but could consider this at follow-up visit if she has not had much success in losing weight.   Disposition: Follow up in 6 months.   Signed, Aline FORBES Door, PA-C  03/03/2024 9:04 PM    [facility]

## 2024-02-25 ENCOUNTER — Encounter: Admitting: Physician Assistant

## 2024-03-03 ENCOUNTER — Ambulatory Visit: Payer: Self-pay | Attending: Cardiology | Admitting: Student

## 2024-03-03 ENCOUNTER — Encounter: Payer: Self-pay | Admitting: Student

## 2024-03-03 VITALS — BP 126/72 | HR 79 | Ht 65.5 in | Wt 257.4 lb

## 2024-03-03 DIAGNOSIS — R002 Palpitations: Secondary | ICD-10-CM | POA: Diagnosis not present

## 2024-03-03 DIAGNOSIS — E66813 Obesity, class 3: Secondary | ICD-10-CM | POA: Diagnosis not present

## 2024-03-03 DIAGNOSIS — I10 Essential (primary) hypertension: Secondary | ICD-10-CM

## 2024-03-03 DIAGNOSIS — R0609 Other forms of dyspnea: Secondary | ICD-10-CM

## 2024-03-03 DIAGNOSIS — Z6841 Body Mass Index (BMI) 40.0 and over, adult: Secondary | ICD-10-CM

## 2024-03-03 NOTE — Patient Instructions (Signed)
 Medication Instructions:  No changes *If you need a refill on your cardiac medications before your next appointment, please call your pharmacy*  Lab Work: No labs\  Testing/Procedures: No testing  Follow-Up: At [facility], you and your health needs are our priority.  As part of our continuing mission to provide you with exceptional heart care, our providers are all part of one team.  This team includes your primary Cardiologist (physician) and Advanced Practice Providers or APPs (Physician Assistants and Nurse Practitioners) who all work together to provide you with the care you need, when you need it.  Your next appointment:   6 month(s)  Provider:   Alm Clay, MD

## 2024-04-01 ENCOUNTER — Encounter: Payer: Self-pay | Admitting: Podiatry

## 2024-04-01 ENCOUNTER — Ambulatory Visit (INDEPENDENT_AMBULATORY_CARE_PROVIDER_SITE_OTHER): Admitting: Podiatry

## 2024-04-01 DIAGNOSIS — Z9889 Other specified postprocedural states: Secondary | ICD-10-CM | POA: Diagnosis not present

## 2024-04-01 DIAGNOSIS — S93691D Other sprain of right foot, subsequent encounter: Secondary | ICD-10-CM | POA: Diagnosis not present

## 2024-04-01 NOTE — Progress Notes (Signed)
 She presents today date of surgery September 26, 2023 status post endoscopic plantar fasciotomy and PRP injection states that it still swells up and is still have some pain rating here if she points along the medial longitudinal arch and into the heel.  Objective: Vital signs are stable she is alert oriented x 3.  Pulses are palpable.  Currently she is not wearing her orthotics on a regular basis.  Most likely she is experiencing some muscle pain in the medial longitudinal arch.  Plan she is going to follow-up with Sueanne for orthotic remake or augmentation.

## 2024-05-17 ENCOUNTER — Encounter: Payer: Self-pay | Admitting: Radiology

## 2024-05-22 ENCOUNTER — Other Ambulatory Visit: Payer: Self-pay | Admitting: Podiatry

## 2024-06-14 ENCOUNTER — Telehealth: Payer: Self-pay | Admitting: Podiatry

## 2024-06-14 NOTE — Telephone Encounter (Signed)
 Patient called in regards to her handicap placard. She says it expired in November and she would like it extended because she is still experiencing discomfort and swelling from her surgery earlier this year.

## 2024-06-15 NOTE — Telephone Encounter (Signed)
 Called patient to let her know her placard is ready for pickup at front desk.

## 2024-06-25 ENCOUNTER — Other Ambulatory Visit: Payer: Self-pay | Admitting: Physician Assistant

## 2024-06-25 DIAGNOSIS — Z1231 Encounter for screening mammogram for malignant neoplasm of breast: Secondary | ICD-10-CM

## 2024-07-06 ENCOUNTER — Ambulatory Visit: Admitting: Physician Assistant

## 2024-07-13 ENCOUNTER — Ambulatory Visit: Admitting: Physician Assistant

## 2024-07-13 VITALS — BP 132/88 | HR 71 | Temp 97.5°F | Ht 65.5 in | Wt 249.2 lb

## 2024-07-13 DIAGNOSIS — K5909 Other constipation: Secondary | ICD-10-CM | POA: Diagnosis not present

## 2024-07-13 DIAGNOSIS — R7303 Prediabetes: Secondary | ICD-10-CM

## 2024-07-13 DIAGNOSIS — E559 Vitamin D deficiency, unspecified: Secondary | ICD-10-CM

## 2024-07-13 LAB — VITAMIN D 25 HYDROXY (VIT D DEFICIENCY, FRACTURES): VITD: 16.08 ng/mL — ABNORMAL LOW (ref 30.00–100.00)

## 2024-07-13 LAB — HEMOGLOBIN A1C: Hgb A1c MFr Bld: 6 % (ref 4.6–6.5)

## 2024-07-13 NOTE — Progress Notes (Unsigned)
 "   Patient ID: Natalie Cordova, female    DOB: 1976-06-25, 48 y.o.   MRN: [id number]   Assessment & Plan:  Prediabetes -     Hemoglobin A1c  Vitamin D  deficiency -     VITAMIN D  25 Hydroxy (Vit-D Deficiency, Fractures)     Assessment & Plan Prediabetes A1c was 6.0% in June, indicating prediabetes. Weight loss of 8 pounds since August and 11 pounds since last year. Discussed insulin  resistance and its impact on weight management. Consideration of GLP-1 receptor agonists for weight management and insulin  sensitivity. Discussed potential risks and benefits of GLP-1 receptor agonists, including cost and insurance coverage. Zepbound is a viable option with reduced cost and direct self-pay options available. Discussed the need for long-term management similar to hypertension. - Ordered A1c test to reassess glucose control - Discussed GLP-1 receptor agonists, specifically Zepbound, as a potential treatment option - Provided information on Zepbound and self-pay options - Encouraged discussion with husband and consideration of treatment options Lab Results  Component Value Date   HGBA1C 6.0 07/13/2024   HGBA1C 6.0 01/01/2024     Obesity BMI is 40.84. Weight loss of 8 pounds since August and 11 pounds since last year. Discussed challenges with weight management, including insulin  resistance and lifestyle factors. Consideration of GLP-1 receptor agonists for weight management. Discussed the importance of cardiovascular exercise and dietary modifications. Zepbound is a viable option with reduced cost and direct self-pay options available. - Encouraged cardiovascular exercise and dietary modifications - Discussed GLP-1 receptor agonists, specifically Zepbound, as a potential treatment option - Provided information on Zepbound and self-pay options  Chronic constipation Managed with Linzess  as needed. Discussed potential impact of GLP-1 receptor agonists on bowel motility and the need for  daily Linzess  if starting GLP-1 therapy. - Continue Linzess  as needed - Discussed potential need for daily Linzess  if starting GLP-1 therapy  Vitamin D  deficiency Previously diagnosed with vitamin D  deficiency. No significant mood or energy changes reported with supplementation. - Ordered vitamin D  level to reassess deficiency status      Return in about 6 months (around 01/11/2025) for physical, fasting labs .    Subjective:    Chief Complaint  Patient presents with   Medical Management of Chronic Issues    Here for a 6 month health follow-up. No questions or concerns.     HPI Discussed the use of AI scribe software for clinical note transcription with the patient, who gave verbal consent to proceed.  History of Present Illness Natalie Cordova is a 48 year old female with insulin  resistance and prediabetes who presents for a six-month follow-up visit.  Her last A1c in June was 6.0%. Since her last visit in August, she has lost 8 pounds, and a total of 11 pounds since last year. Weight loss has been challenging, especially with recent stress related to job changes.  She manages her weight through diet and exercise, although maintaining consistency is difficult. If she does not monitor her diet or exercise, she tends to gain weight. She uses a treadmill and walk videos for exercise but is not consistent due to her husband's schedule and shorter daylight hours.  She has a history of foot injury which has improved, allowing her to walk more during the summer. She also has a history of constipation, for which she takes Linzess  as needed, and diverticulosis with rare patches in the sigmoid colon. She has undergone a colonoscopy with no significant findings apart from the diverticulosis.  She  reports occasional alcohol consumption, specifically a glass of wine, and denies smoking. She is experiencing stress due to changes at her job, which may affect her seniority and work  environment.  She is not aware of any sleep apnea or cardiovascular risk factors. There have been no significant changes in her family situation. She has been taking vitamin D  but did not notice any mood or energy changes.     Past Medical History:  Diagnosis Date   Anxiety 11/2016   Asthma 04/2015   None   Contraceptive management 03/07/2014   Depression    Essential hypertension    related to anxiety and stress per patient   Fibroids 07/22/2017   Heart palpitations    Migraine headache    Osteoarthritis of both knees    Plantar fasciitis of right foot     Past Surgical History:  Procedure Laterality Date   ABDOMINAL HYSTERECTOMY  12/2021   None   COLONOSCOPY WITH PROPOFOL  N/A 04/03/2022   Procedure: COLONOSCOPY WITH PROPOFOL ;  Surgeon: Shaaron Lamar HERO, MD;  Location: [facility];  Service: Endoscopy;  Laterality: N/A;  7:30am   HYSTEROSCOPY N/A 11/07/2017   Procedure: HYSTEROSCOPY WITH HYDROTHERMAL ABLATION;  Surgeon: Edsel Norleen GAILS, MD;  Location: [facility];  Service: Gynecology;  Laterality: N/A;   LAPAROSCOPIC BILATERAL SALPINGECTOMY Bilateral 11/07/2017   Procedure: LAPAROSCOPIC BILATERAL SALPINGECTOMY;  Surgeon: Edsel Norleen GAILS, MD;  Location: [facility];  Service: Gynecology;  Laterality: Bilateral;   LEEP  1995   SUPRACERVICAL ABDOMINAL HYSTERECTOMY N/A 01/09/2022   Procedure: HYSTERECTOMY SUPRACERVICAL ABDOMINAL;  Surgeon: Jayne Vonn DEL, MD;  Location: [facility];  Service: Gynecology;  Laterality: N/A;   WISDOM TOOTH EXTRACTION      Family History  Problem Relation Age of Onset   Breast cancer Mother    Hypertension Mother    Cancer Mother    Colon polyps Father        66s   High Cholesterol Father    Asthma Father    COPD Father    Hypertension Father    Healthy Brother    Breast cancer Maternal Aunt    Cancer Maternal Aunt    Breast cancer Maternal Aunt    Cancer Maternal Aunt    Cancer Maternal Grandfather        lung   Cancer Maternal Aunt    Colon  cancer Neg Hx     Social History[1]   Allergies[2]  Review of Systems NEGATIVE UNLESS OTHERWISE INDICATED IN HPI      Objective:     BP 132/88 (BP Location: Right Arm, Patient Position: Sitting, Cuff Size: Large)   Pulse 71   Temp (!) 97.5 F (36.4 C) (Temporal)   Ht 5' 5.5 (1.664 m)   Wt 249 lb 3.2 oz (113 kg)   LMP 10/26/2021   SpO2 95%   BMI 40.84 kg/m   Wt Readings from Last 3 Encounters:  07/13/24 249 lb 3.2 oz (113 kg)  03/03/24 257 lb 6.4 oz (116.8 kg)  01/01/24 258 lb (117 kg)    BP Readings from Last 3 Encounters:  07/13/24 132/88  03/03/24 126/72  01/01/24 117/73     Physical Exam Vitals and nursing note reviewed.  Constitutional:      General: She is not in acute distress.    Appearance: Normal appearance. She is obese. She is not ill-appearing.  HENT:     Head: Normocephalic.     Right Ear: External ear normal.     Left  Ear: External ear normal.     Mouth/Throat:     Pharynx: No oropharyngeal exudate or posterior oropharyngeal erythema.  Eyes:     Extraocular Movements: Extraocular movements intact.     Conjunctiva/sclera: Conjunctivae normal.     Pupils: Pupils are equal, round, and reactive to light.  Cardiovascular:     Rate and Rhythm: Normal rate and regular rhythm.     Pulses: Normal pulses.     Heart sounds: Normal heart sounds. No murmur heard. Pulmonary:     Effort: Pulmonary effort is normal. No respiratory distress.     Breath sounds: Normal breath sounds. No wheezing.  Musculoskeletal:     Cervical back: Normal range of motion.  Skin:    General: Skin is warm.  Neurological:     Mental Status: She is alert and oriented to person, place, and time.  Psychiatric:        Mood and Affect: Mood normal.        Behavior: Behavior normal.             Haakon Titsworth M [place], PA-C     [1]  Social History Tobacco Use   Smoking status: Never   Smokeless tobacco: Never  Vaping Use   Vaping status: Never Used  Substance Use  Topics   Alcohol use: Yes    Comment: occ beer or wine   Drug use: No  [2] No Known Allergies  "

## 2024-07-13 NOTE — Patient Instructions (Addendum)
 Look at [employer] self pay options online for Zepbound - send me a Mychart message if interested     VISIT SUMMARY: You came in for your six-month follow-up visit to discuss your prediabetes, weight management, and other health concerns. We reviewed your recent weight loss, exercise habits, and stress factors. We also discussed potential new treatments and reassessed your current medications.  YOUR PLAN: PREDIABETES: Your A1c level was 6.0% in June, indicating prediabetes. You have lost 8 pounds since August and 11 pounds since last year. -We ordered an A1c test to reassess your glucose control. -We discussed GLP-1 receptor agonists, specifically Zepbound, as a potential treatment option for weight management and insulin  sensitivity. -We provided information on Zepbound and self-pay options. -Please discuss these treatment options with your husband and consider them carefully.  OBESITY: Your BMI is 40.84. You have lost 8 pounds since August and 11 pounds since last year. -Continue with cardiovascular exercise and dietary modifications. -We discussed GLP-1 receptor agonists, specifically Zepbound, as a potential treatment option for weight management. -We provided information on Zepbound and self-pay options.  CHRONIC CONSTIPATION: You have chronic constipation managed with Linzess  as needed. -Continue taking Linzess  as needed. -We discussed the potential need for daily Linzess  if you start GLP-1 therapy.  VITAMIN D  DEFICIENCY: You have a history of vitamin D  deficiency with no significant mood or energy changes reported with supplementation. -We ordered a vitamin D  level test to reassess your deficiency status.                      Contains text generated by [employer].                                 Contains text generated by [employer].

## 2024-07-14 ENCOUNTER — Encounter: Payer: Self-pay | Admitting: Physician Assistant

## 2024-07-18 ENCOUNTER — Other Ambulatory Visit: Payer: Self-pay | Admitting: Physician Assistant

## 2024-07-18 ENCOUNTER — Ambulatory Visit: Payer: Self-pay | Admitting: Physician Assistant

## 2024-07-18 MED ORDER — TIRZEPATIDE-WEIGHT MANAGEMENT 2.5 MG/0.5ML ~~LOC~~ SOLN: 2.5000 mg | SUBCUTANEOUS | 2 refills | Status: AC

## 2024-07-18 MED ORDER — VITAMIN D (ERGOCALCIFEROL) 1.25 MG (50000 UNIT) PO CAPS: 50000.0000 [IU] | ORAL_CAPSULE | ORAL | 0 refills | Status: AC

## 2024-07-22 ENCOUNTER — Ambulatory Visit
Admission: RE | Admit: 2024-07-22 | Discharge: 2024-07-22 | Disposition: A | Discharge status: Home / Self Care | Source: Ambulatory Visit | Attending: Physician Assistant | Admitting: Physician Assistant

## 2024-07-22 DIAGNOSIS — Z1231 Encounter for screening mammogram for malignant neoplasm of breast: Secondary | ICD-10-CM

## 2024-07-28 ENCOUNTER — Other Ambulatory Visit: Payer: Self-pay | Admitting: Physician Assistant

## 2024-08-13 ENCOUNTER — Other Ambulatory Visit: Payer: Self-pay

## 2024-08-13 NOTE — Telephone Encounter (Signed)
 Please see pt response.SABRA

## 2024-08-13 NOTE — Telephone Encounter (Signed)
 Please see pt msg and advise recommendations or prefer OV to discuss more

## 2025-01-04 ENCOUNTER — Encounter: Admitting: Physician Assistant

## 2025-01-06 ENCOUNTER — Encounter: Admitting: Physician Assistant
